# Patient Record
Sex: Male | Born: 1935 | Race: White | Hispanic: No | Marital: Married | Smoking: Former smoker
Health system: Southern US, Community
[De-identification: ages and names within clinical notes are randomized; demographics above are authoritative.]

## PROBLEM LIST (undated history)

## (undated) DIAGNOSIS — K635 Polyp of colon: Secondary | ICD-10-CM

## (undated) DIAGNOSIS — H9190 Unspecified hearing loss, unspecified ear: Secondary | ICD-10-CM

## (undated) DIAGNOSIS — Z931 Gastrostomy status: Secondary | ICD-10-CM

## (undated) DIAGNOSIS — R3915 Urgency of urination: Secondary | ICD-10-CM

## (undated) DIAGNOSIS — Z9221 Personal history of antineoplastic chemotherapy: Secondary | ICD-10-CM

## (undated) DIAGNOSIS — R51 Headache: Secondary | ICD-10-CM

## (undated) DIAGNOSIS — K469 Unspecified abdominal hernia without obstruction or gangrene: Secondary | ICD-10-CM

## (undated) DIAGNOSIS — C439 Malignant melanoma of skin, unspecified: Secondary | ICD-10-CM

## (undated) DIAGNOSIS — C4490 Unspecified malignant neoplasm of skin, unspecified: Secondary | ICD-10-CM

## (undated) DIAGNOSIS — R972 Elevated prostate specific antigen [PSA]: Secondary | ICD-10-CM

## (undated) DIAGNOSIS — Z87448 Personal history of other diseases of urinary system: Secondary | ICD-10-CM

## (undated) DIAGNOSIS — K219 Gastro-esophageal reflux disease without esophagitis: Secondary | ICD-10-CM

## (undated) DIAGNOSIS — C50929 Malignant neoplasm of unspecified site of unspecified male breast: Secondary | ICD-10-CM

## (undated) DIAGNOSIS — Z125 Encounter for screening for malignant neoplasm of prostate: Principal | ICD-10-CM

## (undated) DIAGNOSIS — C01 Malignant neoplasm of base of tongue: Secondary | ICD-10-CM

## (undated) DIAGNOSIS — K5790 Diverticulosis of intestine, part unspecified, without perforation or abscess without bleeding: Secondary | ICD-10-CM

## (undated) DIAGNOSIS — M1990 Unspecified osteoarthritis, unspecified site: Secondary | ICD-10-CM

## (undated) DIAGNOSIS — G500 Trigeminal neuralgia: Secondary | ICD-10-CM

## (undated) DIAGNOSIS — R110 Nausea: Principal | ICD-10-CM

## (undated) DIAGNOSIS — I10 Essential (primary) hypertension: Secondary | ICD-10-CM

## (undated) DIAGNOSIS — G4700 Insomnia, unspecified: Secondary | ICD-10-CM

## (undated) DIAGNOSIS — D649 Anemia, unspecified: Secondary | ICD-10-CM

## (undated) DIAGNOSIS — IMO0001 Reserved for inherently not codable concepts without codable children: Secondary | ICD-10-CM

## (undated) DIAGNOSIS — J984 Other disorders of lung: Secondary | ICD-10-CM

## (undated) DIAGNOSIS — R350 Frequency of micturition: Secondary | ICD-10-CM

## (undated) DIAGNOSIS — N189 Chronic kidney disease, unspecified: Secondary | ICD-10-CM

## (undated) DIAGNOSIS — Z923 Personal history of irradiation: Secondary | ICD-10-CM

## (undated) DIAGNOSIS — Z8719 Personal history of other diseases of the digestive system: Secondary | ICD-10-CM

## (undated) DIAGNOSIS — E785 Hyperlipidemia, unspecified: Secondary | ICD-10-CM

## (undated) DIAGNOSIS — F419 Anxiety disorder, unspecified: Secondary | ICD-10-CM

## (undated) DIAGNOSIS — N201 Calculus of ureter: Secondary | ICD-10-CM

## (undated) DIAGNOSIS — H269 Unspecified cataract: Secondary | ICD-10-CM

## (undated) DIAGNOSIS — Z5189 Encounter for other specified aftercare: Secondary | ICD-10-CM

## (undated) DIAGNOSIS — K5730 Diverticulosis of large intestine without perforation or abscess without bleeding: Secondary | ICD-10-CM

## (undated) HISTORY — DX: Encounter for screening for malignant neoplasm of prostate: Z12.5

## (undated) HISTORY — DX: Other disorders of lung: J98.4

## (undated) HISTORY — DX: Malignant neoplasm of base of tongue: C01

## (undated) HISTORY — DX: Gastrostomy status: Z93.1

## (undated) HISTORY — PX: MELANOMA EXCISION: SHX5266

## (undated) HISTORY — DX: Polyp of colon: K63.5

## (undated) HISTORY — DX: Personal history of antineoplastic chemotherapy: Z92.21

## (undated) HISTORY — PX: OTHER SURGICAL HISTORY: SHX169

## (undated) HISTORY — DX: Elevated prostate specific antigen (PSA): R97.20

## (undated) HISTORY — DX: Nausea: R11.0

## (undated) HISTORY — PX: GASTROSTOMY TUBE PLACEMENT: SHX655

## (undated) HISTORY — DX: Unspecified malignant neoplasm of skin, unspecified: C44.90

## (undated) HISTORY — DX: Personal history of irradiation: Z92.3

## (undated) HISTORY — DX: Malignant melanoma of skin, unspecified: C43.9

## (undated) HISTORY — DX: Diverticulosis of intestine, part unspecified, without perforation or abscess without bleeding: K57.90

---

## 1986-03-22 DIAGNOSIS — C50929 Malignant neoplasm of unspecified site of unspecified male breast: Secondary | ICD-10-CM

## 1986-03-22 HISTORY — PX: SIMPLE MASTECTOMY: SHX2409

## 1986-03-22 HISTORY — DX: Malignant neoplasm of unspecified site of unspecified male breast: C50.929

## 1998-01-04 ENCOUNTER — Emergency Department (HOSPITAL_COMMUNITY): Admission: EM | Admit: 1998-01-04 | Discharge: 1998-01-04 | Payer: Self-pay | Admitting: Emergency Medicine

## 1999-10-30 ENCOUNTER — Encounter: Payer: Self-pay | Admitting: Emergency Medicine

## 1999-10-30 ENCOUNTER — Emergency Department (HOSPITAL_COMMUNITY): Admission: EM | Admit: 1999-10-30 | Discharge: 1999-10-30 | Payer: Self-pay | Admitting: Emergency Medicine

## 2000-03-22 DIAGNOSIS — C4490 Unspecified malignant neoplasm of skin, unspecified: Secondary | ICD-10-CM

## 2000-03-22 HISTORY — DX: Unspecified malignant neoplasm of skin, unspecified: C44.90

## 2000-06-14 ENCOUNTER — Emergency Department (HOSPITAL_COMMUNITY): Admission: EM | Admit: 2000-06-14 | Discharge: 2000-06-14 | Payer: Self-pay | Admitting: Emergency Medicine

## 2001-01-11 ENCOUNTER — Emergency Department (HOSPITAL_COMMUNITY): Admission: EM | Admit: 2001-01-11 | Discharge: 2001-01-11 | Payer: Self-pay | Admitting: *Deleted

## 2001-01-11 ENCOUNTER — Encounter: Payer: Self-pay | Admitting: Emergency Medicine

## 2001-03-22 HISTORY — PX: CRANIECTOMY SUBOCCIPITAL FOR EXPLORATION / DECOMPRESSION CRANIAL NERVES: SUR326

## 2001-10-08 ENCOUNTER — Encounter: Payer: Self-pay | Admitting: Neurosurgery

## 2001-10-08 ENCOUNTER — Ambulatory Visit (HOSPITAL_COMMUNITY): Admission: RE | Admit: 2001-10-08 | Discharge: 2001-10-08 | Payer: Self-pay | Admitting: Neurosurgery

## 2001-10-11 ENCOUNTER — Encounter: Payer: Self-pay | Admitting: Neurosurgery

## 2001-10-13 ENCOUNTER — Inpatient Hospital Stay (HOSPITAL_COMMUNITY): Admission: RE | Admit: 2001-10-13 | Discharge: 2001-10-16 | Payer: Self-pay | Admitting: Neurosurgery

## 2004-02-25 ENCOUNTER — Ambulatory Visit: Payer: Self-pay | Admitting: Internal Medicine

## 2004-03-06 ENCOUNTER — Ambulatory Visit: Payer: Self-pay | Admitting: Internal Medicine

## 2004-06-18 ENCOUNTER — Emergency Department (HOSPITAL_COMMUNITY): Admission: EM | Admit: 2004-06-18 | Discharge: 2004-06-18 | Payer: Self-pay | Admitting: Emergency Medicine

## 2004-07-17 ENCOUNTER — Emergency Department (HOSPITAL_COMMUNITY): Admission: EM | Admit: 2004-07-17 | Discharge: 2004-07-17 | Payer: Self-pay | Admitting: Emergency Medicine

## 2004-11-27 ENCOUNTER — Inpatient Hospital Stay (HOSPITAL_COMMUNITY): Admission: EM | Admit: 2004-11-27 | Discharge: 2004-11-30 | Payer: Self-pay | Admitting: Emergency Medicine

## 2004-11-28 ENCOUNTER — Ambulatory Visit: Payer: Self-pay | Admitting: Internal Medicine

## 2004-11-28 ENCOUNTER — Ambulatory Visit: Payer: Self-pay | Admitting: Endocrinology

## 2004-11-28 ENCOUNTER — Encounter (INDEPENDENT_AMBULATORY_CARE_PROVIDER_SITE_OTHER): Payer: Self-pay | Admitting: *Deleted

## 2005-02-08 ENCOUNTER — Ambulatory Visit: Payer: Self-pay | Admitting: Internal Medicine

## 2006-10-13 DIAGNOSIS — I10 Essential (primary) hypertension: Secondary | ICD-10-CM | POA: Insufficient documentation

## 2006-11-08 ENCOUNTER — Ambulatory Visit: Payer: Self-pay | Admitting: Internal Medicine

## 2006-11-08 DIAGNOSIS — E785 Hyperlipidemia, unspecified: Secondary | ICD-10-CM

## 2006-11-08 DIAGNOSIS — K5730 Diverticulosis of large intestine without perforation or abscess without bleeding: Secondary | ICD-10-CM | POA: Insufficient documentation

## 2006-11-08 DIAGNOSIS — Z860100 Personal history of colon polyps, unspecified: Secondary | ICD-10-CM | POA: Insufficient documentation

## 2006-11-08 DIAGNOSIS — E7800 Pure hypercholesterolemia, unspecified: Secondary | ICD-10-CM

## 2006-11-08 DIAGNOSIS — R1011 Right upper quadrant pain: Secondary | ICD-10-CM

## 2006-11-08 LAB — CONVERTED CEMR LAB
ALT: 24 units/L (ref 0–53)
AST: 19 units/L (ref 0–37)
Albumin: 3.7 g/dL (ref 3.5–5.2)
BUN: 18 mg/dL (ref 6–23)
Basophils Absolute: 0.1 10*3/uL (ref 0.0–0.1)
Calcium: 9.4 mg/dL (ref 8.4–10.5)
Chloride: 107 meq/L (ref 96–112)
Eosinophils Absolute: 0.1 10*3/uL (ref 0.0–0.6)
GFR calc non Af Amer: 48 mL/min
MCHC: 34.5 g/dL (ref 30.0–36.0)
MCV: 88.9 fL (ref 78.0–100.0)
Platelets: 202 10*3/uL (ref 150–400)
RBC: 4.68 M/uL (ref 4.22–5.81)
WBC: 6.8 10*3/uL (ref 4.5–10.5)

## 2006-11-09 ENCOUNTER — Ambulatory Visit: Payer: Self-pay | Admitting: Cardiology

## 2007-08-29 ENCOUNTER — Telehealth: Payer: Self-pay | Admitting: Internal Medicine

## 2007-09-15 ENCOUNTER — Ambulatory Visit: Payer: Self-pay | Admitting: Internal Medicine

## 2007-09-15 ENCOUNTER — Observation Stay (HOSPITAL_COMMUNITY): Admission: EM | Admit: 2007-09-15 | Discharge: 2007-09-16 | Payer: Self-pay | Admitting: Unknown Physician Specialty

## 2007-09-18 ENCOUNTER — Telehealth: Payer: Self-pay | Admitting: Internal Medicine

## 2007-09-25 ENCOUNTER — Encounter: Payer: Self-pay | Admitting: Internal Medicine

## 2007-09-25 ENCOUNTER — Ambulatory Visit: Payer: Self-pay

## 2007-09-25 DIAGNOSIS — IMO0001 Reserved for inherently not codable concepts without codable children: Secondary | ICD-10-CM

## 2007-09-25 HISTORY — DX: Reserved for inherently not codable concepts without codable children: IMO0001

## 2007-10-03 ENCOUNTER — Telehealth: Payer: Self-pay | Admitting: Internal Medicine

## 2007-10-07 ENCOUNTER — Emergency Department (HOSPITAL_COMMUNITY): Admission: EM | Admit: 2007-10-07 | Discharge: 2007-10-07 | Payer: Self-pay | Admitting: Emergency Medicine

## 2007-10-12 ENCOUNTER — Ambulatory Visit: Payer: Self-pay | Admitting: Internal Medicine

## 2007-10-12 DIAGNOSIS — G500 Trigeminal neuralgia: Secondary | ICD-10-CM | POA: Insufficient documentation

## 2007-12-22 ENCOUNTER — Encounter: Payer: Self-pay | Admitting: Internal Medicine

## 2009-03-27 ENCOUNTER — Telehealth: Payer: Self-pay | Admitting: Family Medicine

## 2009-10-13 ENCOUNTER — Encounter: Payer: Self-pay | Admitting: Internal Medicine

## 2009-11-19 ENCOUNTER — Ambulatory Visit: Payer: Self-pay | Admitting: Internal Medicine

## 2009-11-19 DIAGNOSIS — F411 Generalized anxiety disorder: Secondary | ICD-10-CM | POA: Insufficient documentation

## 2010-04-21 NOTE — Progress Notes (Signed)
Summary: glucometer for daughter  Phone Note Call from Patient   Summary of Call: patient's daughter Jatniel Verastegui) is calling because she needs a voice recognition glucometer.  she is on medicaid but is willing to come in for an office visit.  is a written rx okay to give to patient?  also is there a voice recognition insuline pump?  pt is staying with linda suggs. also is there and endo that will except her as a patient.  pt's number is [phone] and linda's number is [phone] or [phone]. Initial call taken by: Kern Reap CMA Duncan Dull),  March 27, 2009 1:01 PM  Follow-up for Phone Call        crystal is not a patient here, she needs to contact the physician that she is working with Follow-up by: Roderick Pee MD,  March 27, 2009 1:45 PM  Additional Follow-up for Phone Call Additional follow up Details #1::        patient is aware Additional Follow-up by: Kern Reap CMA Duncan Dull),  March 27, 2009 3:02 PM

## 2010-04-21 NOTE — Assessment & Plan Note (Signed)
Summary: anxiety/dm   Vital Signs:  Patient profile:   77 year old male Weight:      194 pounds Temp:     97.9 degrees F oral BP sitting:   146 / 80  (left arm) Cuff size:   regular  Vitals Entered By: Duard Brady LPN (November 19, 2009 10:05 AM) CC: c/o increased anxiety - not sleeping  Is Patient Diabetic? No   CC:  c/o increased anxiety - not sleeping .  History of Present Illness: 77 year old patient since for follow-up.  he has not been seen in this office in some time and is followed at the [facility] system.  He states that he was seen 6 days ago for a complete physical.  He states for the past week or so he has had  episodes of anxiety.  He states that he has had a difficult time sleeping and just as the starts to not off awakens with a startle.  He is a former tobacco user, but none in 20 years.  He states that he had similar issues 14 years ago and was treated successfully with lorazepam.  He states that he did have laboratory studies done last week at the [facility].  He has treated hypertension and a history of dyslipidemia.  He also has a history colonic polyps.  He is on  gabapenim apparently for trigeminal neuralgia  Allergies: 1)  ! Codeine  Past History:  Past Medical History: Reviewed history from 10/12/2007 and no changes required. Colonic polyps, hx of Diverticulosis, colon Hyperlipidemia Hypertension trigeminal neuralgia history of melanoma and history of right breast cancer in 1988 lower GI bleed 2006.  History of duodenal AVMs glaucoma  Past Surgical History: Reviewed history from 10/12/2007 and no changes required. melanoma resection x 2 of the back right breast mastectomy 1988 surgery for trigeminal neuralgia 2002  Colonoscopy 2006  Family History: Reviewed history from 11/08/2006 and no changes required. both parents 14 of cardiac complications.  One brother died of an axonal death two sisters, one with COPD  Social History: Reviewed  history from 10/13/2006 and no changes required. Married Former Smoker Alcohol use-no Drug use-no Regular exercise-no  Review of Systems  The patient denies anorexia, fever, weight loss, weight gain, vision loss, decreased hearing, hoarseness, chest pain, syncope, dyspnea on exertion, peripheral edema, prolonged cough, headaches, hemoptysis, abdominal pain, melena, hematochezia, severe indigestion/heartburn, hematuria, incontinence, genital sores, muscle weakness, suspicious skin lesions, transient blindness, difficulty walking, depression, unusual weight change, abnormal bleeding, enlarged lymph nodes, angioedema, breast masses, and testicular masses.    Physical Exam  General:  Well-developed,well-nourished,in no acute distress; alert,appropriate and cooperative throughout examination; 140/84 Head:  Normocephalic and atraumatic without obvious abnormalities. No apparent alopecia or balding. Eyes:  No corneal or conjunctival inflammation noted. EOMI. Perrla. Funduscopic exam benign, without hemorrhages, exudates or papilledema. Vision grossly normal. Mouth:  Oral mucosa and oropharynx without lesions or exudates.  Teeth in good repair. Neck:  No deformities, masses, or tenderness noted. Chest Wall:  No deformities, masses, tenderness or gynecomastia noted. Breasts:  right mastectomy Lungs:  Normal respiratory effort, chest expands symmetrically. Lungs are clear to auscultation, no crackles or wheezes. O2 saturation 97% Heart:  Normal rate and regular rhythm. S1 and S2 normal without gallop, murmur, click, rub or other extra sounds. Abdomen:  Bowel sounds positive,abdomen soft and non-tender without masses, organomegaly or hernias noted. Msk:  No deformity or scoliosis noted of thoracic or lumbar spine.   Pulses:  R and L carotid,radial,femoral,dorsalis  pedis and posterior tibial pulses are full and equal bilaterally Extremities:  No clubbing, cyanosis, edema, or deformity noted with normal  full range of motion of all joints.   Skin:  Intact without suspicious lesions or rashes Cervical Nodes:  No lymphadenopathy noted Axillary Nodes:  No palpable lymphadenopathy   Impression & Recommendations:  Problem # 1:  HYPERCHOLESTEROLEMIA (ICD-272.0)  Problem # 2:  HYPERTENSION (ICD-401.9)  His updated medication list for this problem includes:    Lisinopril 40 Mg Tabs (Lisinopril) .Marland Kitchen... 1 once daily  His updated medication list for this problem includes:    Lisinopril 40 Mg Tabs (Lisinopril) .Marland Kitchen... 1 once daily  Problem # 3:  ANXIETY STATE, UNSPECIFIED (ICD-300.00)  His updated medication list for this problem includes:    Lorazepam 0.5 Mg Tabs (Lorazepam) ..... One twice daily as needed for anxiety or sleep  His updated medication list for this problem includes:    Lorazepam 0.5 Mg Tabs (Lorazepam) ..... One twice daily as needed for anxiety or sleep  Complete Medication List: 1)  Lisinopril 40 Mg Tabs (Lisinopril) .Marland Kitchen.. 1 once daily 2)  Gabapentin 300 Mg Caps (Gabapentin) .... 2 two times a day 3)  Lorazepam 0.5 Mg Tabs (Lorazepam) .... One twice daily as needed for anxiety or sleep  Patient Instructions: 1)  Please schedule a follow-up appointment in 3 months for CPX 2)  Limit your Sodium (Salt). 3)  It is important that you exercise regularly at least 20 minutes 5 times a week. If you develop chest pain, have severe difficulty breathing, or feel very tired , stop exercising immediately and seek medical attention. 4)  Please schedule a follow-up appointment in 1 month. Prescriptions: LORAZEPAM 0.5 MG TABS (LORAZEPAM) one twice daily as needed for anxiety or sleep  #50 x 2   Entered and Authorized by:   Gordy Savers  MD   Signed by:   Gordy Savers  MD on 11/19/2009   Method used:   Print then Give to Patient   RxID:   [id number]

## 2010-04-21 NOTE — Letter (Signed)
Summary: Colonoscopy-Changed to Office Visit Letter  [facility]  [address] [place], [place] [address]   Phone: [phone]  Fax: [phone]      October 13, 2009 MRN: [id number]   [facility] [place] [address] [place], [place]  [address]   Dear Mr. Latour,   According to our records, it is time for you to schedule a Colonoscopy. However, after reviewing your medical record, I feel that an office visit would be most appropriate to more completely evaluate you and determine your need for a repeat procedure.  Please call [phone] (option #2) at your convenience to schedule an office visit. If you have any questions, concerns, or feel that this letter is in error, we would appreciate your call.   Sincerely,  Wilhemina Bonito. Marina Goodell, M.D.  [facility] [phone]

## 2010-08-04 NOTE — Discharge Summary (Signed)
Jose [name], Jose [name] NO.:  [id number]   MEDICAL RECORD NO.:  [id number]          PATIENT TYPE:  INP   LOCATION:  4705                         FACILITY:  [facility]   PHYSICIAN:  Rosalyn Gess. Norins, MD  DATE OF BIRTH:  10/14/1927   DATE OF ADMISSION:  09/15/2007  DATE OF DISCHARGE:  09/16/2007                               DISCHARGE SUMMARY   CHIEF COMPLAINT:  Chest pain, rule out MI.   DISCHARGE DIAGNOSIS:  Chest pain resolved, myocardial infarction ruled  out by serial cardiac enzymes.   HISTORY OF PRESENT ILLNESS:  The patient has had chest pain on the  morning of admission and had fatigue for 3 months.  The patient is a 77-  year-old white male with history of hypertension, hyperlipidemia, remote  tobacco abuse, who reported midsternal chest pain that awoke him from  sleep on the morning of admission.  He described the chest pain as a  pressure-like feeling in the center of his chest like an elephant  sitting on his chest.  He had associated uncontrolled shaking that  lasted about 30 minutes, but no fevers or documented chills.  The  patient reports pain lasting several minutes, but it was intermittent  and worse on deep inspiration.  He had no associated nausea, vomiting,  diaphoresis, or shortness of breath.  The patient does report history of  increased fatigue over the last 3 months.  The patient was subsequent  admitted for evaluation to rule out for MI.   PAST MEDICAL HISTORY:  1. Hypertension.  2. Hyperlipidemia.  3. Diverticulosis with history of GI bleed in Aug 23, 2004.  4. Colon polyps.  5. Gastric and duodenal AVMs.  6. History of melanoma with excision in 08/24/99.  7. History of breast cancer status post lumpectomy and chemotherapy in      1986/08/24.  8. Trigeminal neuralgia status post decompression and microdissection      in 08/23/01.   CURRENT MEDICATIONS:  Lisinopril 40 mg daily.   ALLERGIES:  CODEINE.   FAMILY HISTORY:  Father and mother both deceased in  their 73s with CAD.  Mother had diabetes.   SOCIAL HISTORY:  The patient is married.  He has remote tobacco abuse.  He is a retired Teacher, music.  He had two grown children, one son  deceased in 08/24/2006 secondary to complications of alcohol abuse.   His review of systems revealed no fever, positive for fatigue, negative  for congestion, negative for neck stiffness or abnormalities, positive  for chest pain as noted, negative for lower extremity edema or  respiratory symptoms, no GI complaints except chronic constipation.   Physical exam at admission, blood pressure 112/60, heart rate 57,  respirations 18, and temperature was 97.8.  This is a pleasant  gentleman, awake and alert, in no acute distress.  HEENT exam was  unremarkable.  Chest was clear with no rales, wheezes, or rhonchi.  No  tenderness to palpation.  Cardiovascular with regular rate and rhythm  without murmurs, rubs, or gallops.  Abdomen was soft and nontender with  positive bowel sounds.  Skin was clear.  Neurologic exam was nonfocal.   HOSPITAL COURSE:  1. The patient was admitted to the hospital.  He had cardiac enzymes      point-of-care in the emergency department negative q. hour x2.  The      patient had a q.8 hour cardiac panels with CK of 68, 62, and 74 and      troponin I of 0.01, 0.02, and 0.02.  The patient's telemetry      remained unremarkable.  The patient's chest pain resolved.  With      the patient having normal cardiac enzymes, normal EKG with      resolution of his chest pain and discomfort, he is thought to be      stable and ready for discharge home with follow up with Dr. Eleonore Chiquito who will be seeing the patient and possibly arranging      for outpatient nuclear stress study.  2. Hyperglycemia.  The patient did have a hemoglobin A1c of 6.3%.      Plan will be per Dr. Amador Cunas.  3. Hyperlipidemia.  The patient did have a cholesterol of 217, LDL of      164, and HDL of 23.   PLAN:   Followup with Dr. Amador Cunas in regards to the indication for  medical therapy.   DISCHARGE EXAM:  Temperature was 97.7, blood pressure 122/69, pulse 58,  respirations 18, and O2 sats 100% on room air.  The patient was  encountered walking in the hall.  He returned to his room.  Examination  revealed his lungs to be clear with no rales, wheezes, or rhonchi.  Cardiovascular exam revealed 2+ radial pulse and regular rate and  rhythm.  No further examination conducted.   DISPOSITION:  The patient will be discharged home.  The patient's  doctor, Eleonore Chiquito, will be notified of this admission and need  for close followup.   DISCHARGE MEDICATIONS:  The patient will continue on lisinopril, vitamin  E and B, hydrochlorothiazide 12.5 mg 2 tablets daily, and gabapentin 300  mg p.r.n. for pain.   The patient's condition at the time of discharge dictation is stable and  improved.      Rosalyn Gess Norins, MD  Electronically Signed     MEN/MEDQ  D:  09/16/2007  T:  09/17/2007  Job:  [id number]   cc:   Gordy Savers, MD

## 2010-08-07 NOTE — Discharge Summary (Signed)
[name], Jose [name] NO.:  [id number]   MEDICAL RECORD NO.:  [id number]          PATIENT TYPE:  INP   LOCATION:  5707                         FACILITY:  [facility]   PHYSICIAN:  Thomos Lemons, D.O. [facility]   DATE OF BIRTH:  10/26/1927   DATE OF ADMISSION:  11/27/2004  DATE OF DISCHARGE:  11/30/2004                                 DISCHARGE SUMMARY   DISCHARGE DIAGNOSES:  1.  Gastrointestinal bleed, presumed secondary to colon diverticula.  2.  Anemia secondary to gastrointestinal blood loss.  3.  Small-bowel diverticula.  4.  Gastric and duodenal arteriovenous malformations.  5.  History of hyperlipidemia.  6.  Hypertension.  7.  History of melanoma.  8.  History of breast cancer.   DISCHARGE MEDICATION:  Protonix 40 mg once a day.  He is to follow up with  his primary care physician for further instructions regarding his blood  pressure medicine.   FOLLOW UP INSTRUCTIONS:  He was instructed to follow up with Dr. Amador Cunas  in one week after discharge and also follow up with Dr. Christella Hartigan,  gastroenterologist.   HOSPITAL COURSE:  Patient is a 77 year old male with history of hypertension  breast cancer and hyperlipidemia who presented with bright red blood per  rectum on November 27, 2004.  Patient reported a total of eight blood bowel  movements on admission and was feeling weak and short of breath.  Patient  was initially stabilized with transfusions.  GI was consulted for endoscopy  and also colonoscopy.   PROBLEM #1 -  GASTROINTESTINAL BLEED:  Patient was stabilized with IV  hydration and transfusion of packed red blood cells.  Patient underwent  endoscopy which showed angiodysplasia of stomach and duodenum, not bleeding  but was treated.  He also underwent colonoscopy which revealed  diverticulosis suspect source of GI bleed and a polyp in the sigmoid colon  that was 10 mm.  Biopsy sent to pathology.  A small-bowel follow-through was  ordered which revealed  multiple small-bowel diverticula, no mass was  identified.   PROBLEM #2 -  HYPERTENSION:  Patient's lisinopril was held secondary to  relative hypotension and patient's blood pressure was in the 140s without  any blood pressure medication on discharge and further management will be as  per his primary care physician.   PROBLEM #3 -  HISTORY OF MELANOMA:  No sign of metastatic disease to the  small-bowel.   PROBLEM #4 -  HISTORY OF BREAST CANCER:  Stable during this admission.   PROBLEM #5 -  ARTERIOVENOUS MALFORMATIONS GASTRIC AND DUODENAL:  They were  not bleeding but they were ablated.   LABORATORY DATA:  Prior to admission patient's hemoglobin was 10.1,  hematocrit 29.5.  On November 29, 2004, sodium was 143, potassium 3.9,  chloride 114, CO2 24, blood glucose 105, BUN 16, and creatinine 1.2, calcium  8.0.   FOLLOW UP:  He is to see Dr. Arlyce Dice as an outpatient as needed and we are  still awaiting final pathology on colon polyp.  He is to follow up with his  primary care physician, Dr.  Amador Cunas.   CONDITION ON DISCHARGE:  Improved and stable.      Thomos Lemons, D.O. [facility]  Electronically Signed     RY/MEDQ  D:  02/01/2005  T:  02/02/2005  Job:  [id number]

## 2010-08-07 NOTE — Discharge Summary (Signed)
NAMEDESMAN, POLAK                          ACCOUNT NO.:  [id number]   MEDICAL RECORD NO.:  [id number]                   PATIENT TYPE:  INP   LOCATION:  3101                                 FACILITY:  [facility]   PHYSICIAN:  Clydene Fake, M.D.               DATE OF BIRTH:  [birth year]   DATE OF ADMISSION:  10/13/2001  DATE OF DISCHARGE:  10/16/2001                                 DISCHARGE SUMMARY   ADMISSION DIAGNOSIS:  Right trigeminal neuralgia.   DISCHARGE DIAGNOSIS:  Right trigeminal neuralgia.   PROCEDURES:  Right suboccipital craniectomy and microvascular decompression  of the fifth nerve.   HISTORY OF PRESENT ILLNESS:  The patient is a 77 year old gentleman who has  had progressive increasing right V3 trigeminal neuralgia.  The symptoms  actually began in his early 33s.  It decreased for a long time and then over  the last three to five years has been increasing in severity and now is  unbearable.  He had an MR of the brain which showed no evidence of MS or any  other mass lesion.  The patient was brought in for a microvascular  decompression of the fifth nerve.   HOSPITAL COURSE:  The patient was admitted on the day of surgery and  underwent the procedure without complication.  The artery was pushing into  the  nerve and this was removed at surgery.  The patient was transferred to  the intensive care unit postoperatively.  As soon as he awoke, he noticed  that he did not have any facial pain, hearing was intact, and he had good  sensation of the face.  Over the next couple of days, he complained of  headache and a little back stiffness on the second postoperative day, but  was ambulating and doing well.  He remained neurologically intact and  remained asymptomatic with his trigeminal neuralgia.  On the day of October 16, 2001, he had a good day of ambulating the day before, had been eating, had  no headache, and incisions clean, dry, and intact.   DISPOSITION:  He will  be discharge home in stable condition.   DISCHARGE MEDICATIONS:  1. Gabapentin.  2. Simvastatin.  3. Lisinopril.  4. Multivitamins.  5. Vicodin ES one p.o. q.4h. p.r.n.  6. Ibuprofen over the count p.r.n.   ACTIVITY:  No strenuous activity.   WOUND CARE:  Keep the incision dry until sutures removed.    DIET:  As tolerated.   FOLLOW-UP:  Follow-up will be in about 10 days or so for suture removal.                                               Clydene Fake, M.D.    JRH/MEDQ  D:  10/16/2001  T:  10/22/2001  Job:  [id number]

## 2010-08-07 NOTE — Consult Note (Signed)
[facility]  Patient:    Jose [name], Jose [name]                       MRN: [id number] Proc. Date: 10/30/99 Adm. Date:  [id number] Disc. Date: [id number] Attending:  Benny Lennert CC:         [facility]  [facility]   Consultation Report  HISTORY OF PRESENT ILLNESS:  Jose [name] is a 77 year old right-handed white male born 12-22-1927, with a history of hypertension and hypercholesterolemia.  This patient is followed through the [facility] for his medications, which he did not bring with him and cannot recall the names of.  The patient has had a prior history of a right-sided trigeminal neuralgia two years ago, was treated through the [facility] at that time and given prednisone and Tegretol.  This alleviated his pain very rapidly, and he still had some medications left over.  The patient has done well for two years but a month ago began having recrudescence of the right trigeminal neuralgia once again.  The patient has hard jabs of pain going into the lower right face and jaw that are disabling to him.  The episodes have become frequent throughout the day, lasting one or two minutes.  This patient has begun to take his prednisone and carbamazepine once again and has been taking 200 mg twice a day of carbamazepine for the last couple of weeks prior to coming in.  The patient has had a gradual onset over the last week of some problems with being somewhat staggery.  The patient notes that when he get up out of a chair, he will tend to want to keep going forward, may stagger to once side or the next, does not consistently go to one side or the other.  The patient denies any numbness or weakness on the face, arms, or legs.  Denies any double vision, loss of vision, and denies any true vertigo.  The patient has had no problems with confusion or blacking out.  Neurology was asked to see this  patient for an evaluation.  PAST MEDICAL HISTORY: 1. History of trigeminal neuralgia. 2. Ataxia, likely secondary to medications. 3. Hypercholesterolemia. 4. History of hypertension. 5. History of breast cancer on the right, status post resection 13 years ago. 6. History of melanoma resection x 2.  MEDICATIONS:  Cholesterol medication, possibly Simvastatin.  The patient is on a blood pressure pressure pill that he cannot remember the name of.  He is taking half of an aspirin daily.  SOCIAL HISTORY:  Does not smoke or drink.   The patient is retired, lives in the [place] area with his wife.  Has two children, one of which is 48 years old and is an alcoholic.  One daughter is alive and well.  ALLERGIES:  CODEINE.  FAMILY HISTORY:  Notable in that mother died with an MI.  Father had episodes of shingles.  Cause of death is unknown.  The patient has two sisters who are alive.  Brother died in a fire.  Has a paternal grandmother who died with cancer.  REVIEW OF SYSTEMS:  Notable for no fevers, chills.  The patient denies any neck pain.  Does have some constipation problems.  Denies shortness of breath. Denies blackout episodes or seizures.  PHYSICAL EXAMINATION:  VITAL SIGNS:  Blood pressure 166/92, heart rate 62, respiratory rate 24, temperature afebrile.  GENERAL:  This  patient is a fairly well-developed white male who is alert and cooperative at the time of the examination.  HEENT:  Head is atraumatic.  Eyes:  Pupils are equal, round and reactive to light.  Discs are flat bilaterally.  NECK:  Supple.  No carotid bruits.  CHEST:  Clear to auscultation and percussion.  CARDIOVASCULAR:  Regular rate and rhythm without obvious murmurs or rubs.  EXTREMITIES:  Without significant edema.  NEUROLOGIC:  Cranial nerves as above.  Facial symmetry was present.  The patient has good sensation on the face to pinprick and soft touch bilaterally. The patient has good strength in  the facial muscles and the muscles of head turning and shoulder shrug bilaterally.  Speech is well-enunciated.  Some end-gaze nystagmus is noted bilaterally.  Tongue is midline.  Strength of the muscles of mastication was normal.  The patient has good strength in all four extremities, good, symmetric motor tone is noted throughout.  Sensory testing is intact to pinprick, soft touch, vibratory sensation throughout.  Deep tendon reflexes are symmetric and normal.  Toes are downgoing bilaterally. The patient has good finger-nose-finger, heel-to-shin bilaterally.  Gait is normal.  Tandem gait normal.  Romberg is negative.  No evidence of pronator drift is seen.  LABORATORY DATA:  CT scan of the brain is pending at this time.  Blood work to include a CBC, BMET, and sedimentation rate are pending.  Will add a carbamazepine level to this.  IMPRESSION: 1. Trigeminal neuralgia, right. 2. Slight ataxia, likely secondary to medication. 3. History of hypertension. 4. History of hypercholesterolemia.  This patient is followed through the [facility]. The patient appears to have gotten slightly toxic from carbamazepine after starting from 0 to 400 mg a day.  I do not see evidence of ataxia at this time.  I do not think that the patient has sustained a stroke event.  The patients trigeminal neuralgia, however, will need to be treated adequately if he is being disabled from the severe pain at this point.  PLAN: 1. Will begin a prednisone taper, a 10 mg, 12-day pack. 2. Continue Carbatrol 200 mg twice a day.  Will check carbamazepine levels    today.  The patient will follow up through our office or through the [facility] within the next three or four weeks. DD:  10/30/99 TD:  10/30/99 Job: [id number]

## 2010-08-07 NOTE — Op Note (Signed)
[place]  Patient:    Jose [name], Jose [name] Visit Number: [id number] MRN: [id number]          Service Type: SUR Location: [id number] Attending Physician:  Josie Saunders Dictated by:   Danae Orleans Venetia Maxon, M.D. Proc. Date: 10/13/01 Admit Date:  10/13/2001                             Operative Report  PREOPERATIVE DIAGNOSIS:  Right V3 trigeminal neuralgia.  POSTOPERATIVE DIAGNOSIS:  Right V3 trigeminal neuralgia.  PROCEDURE: 1. Right microvascular decompression of the fifth cranial nerve. 2. Microdissection.  SURGEON:  Danae Orleans. Venetia Maxon, M.D.  ASSISTANT:  Clydene Fake, M.D.  ANESTHESIA:  General endotracheal anesthesia.  ESTIMATED BLOOD LOSS:  400 cc.  COMPLICATIONS:  None.  DISPOSITION:  Recovery.  INDICATIONS:  The patient is a 77 year old man with approximately 50-year history of lancinating facial pain consistent with a V3 distribution trigeminal neuralgia on the right.  He has gotten much worse recently and is not able to eat, brush his teeth and has been in excruciating pain.  He had a normal MRI of his brain.  It was elected to take him to surgery for microvascular decompression of the trigeminal nerve.  PROCEDURE:  The patient was brought to the operating room.  Following the satisfactory and uncomplicated induction of general endotracheal anesthesia, placement of intravenous lines, Foley catheter and arterial line, he was placed in a park-bench position, left side down, and he was placed in a three-pin head fixation with the ______ oriented most superiorly.  A lazy-S curvilinear incision was made just behind his right ear and after infiltrating the subcutaneous tissues with 0.25% Marcaine and 0.5% lidocaine, 1:200,000 epinephrine.  After big incision, Raney clips were applied and then the soft tissues and muscle were stripped off of the skull.  A large perforating vein coming right out of the skull was transected with  this maneuver, and there was approximately 200 cc of blood loss before this was controlled.  This was subsequently controlled without difficulty.  Using the Anspach drill and acorn bit, a craniectomy was performed just posterior and inferior to the confluence of the sinuses.  The transverse sinus was exposed and as it drained into the sigmoid sinus on the right.  Approximately a half-dollar size craniectomy was created.  Subsequently, the microscope was then brought into the field and the dura was opened in the cruciate fashion with one limb of the dural opening based on the angle of the junction of the transverse and the sigmoid sinuses.  The Greenberg retractor was then assembled and using a 1/4-inch retractor blade, the CSF was drained and the cerebellar relaxation was obtained.  The retractor was then placed to expose the fifth cranial nerve.  The seventh and eighth cranial nerve were identified, and these were inferior to the exposure and not felt again under any tension.  Using a high magnification and microscopic visualization, the petrosal vein was identified as it entered the dura.  This was quite low and it was felt after manipulation of the fifth cranial nerve, it would be necessary to cauterize this in order to immobilize the fifth cranial nerve. The fifth cranial nerve was seen to be tented up overlying a large arterial loop, which appeared to compress the trigeminal nerve from the cephalad orientation.  There was a small perforating branch which supplied the brain stem which was draped over the  trigeminal nerve, and it was felt that this could not be taken without potential neurologic injury.  Therefore using a variety of arachnoid dissectors and Rhoton dissectors, the arterial loop was immobilized and the nerve was decompressed.  Subsequently, a Teflon pledget was fashioned and placed in the interval between the arterial loop and the trigeminal nerve.  This was felt to  adequately decompress the trigeminal nerve.  The wound was then copiously irrigated with continuous saline.  The self-retaining retractor was removed.  The microscope was taken out of the field.  There was excellent hemostasis.  The dura was closed with Nurolon sutures.  This is not a watertight closure.  A circle of Gelfoam was placed over the craniectomy defect.  The muscular layer was then reapproximated with 0 Vicryl sutures.  Subcutaneous tissues were reapproximated with 2-0 Vicryl interrupted, inverted sutures.  Skin edges were reapproximated with running 3-0 nylon lock stitch.  The patient was subsequently taken out of pin fixation and returned to the supine position on the stretcher.  He was extubated in the operating room, taken to the recovery room in stable satisfactory condition, and he tolerated this operation well.  Counts are correct at the end of the case. Dictated by:   Danae Orleans Venetia Maxon, M.D. Attending Physician:  Josie Saunders DD:  10/13/01 TD:  10/17/01 Job: [id number]

## 2010-08-07 NOTE — Consult Note (Signed)
NAMESHAI, MCKENZIE NO.:  [id number]   MEDICAL RECORD NO.:  [id number]          PATIENT TYPE:  EMS   LOCATION:  [facility]                         FACILITY:  [facility]   PHYSICIAN:  Iva Boop, M.D. LHCDATE OF BIRTH:  December 03, 1927   DATE OF CONSULTATION:  11/27/2004  DATE OF DISCHARGE:                                   CONSULTATION   REQUESTING PHYSICIAN:  Rene Paci, M.D.   PRIMARY CARE PHYSICIAN:  Gordy Savers, M.D.   REASON FOR CONSULTATION:  GI bleeding.   ASSESSMENT:  Gastrointestinal bleeding with mixture of melena and  hematochezia, elevated BUN and creatinine.  Probably an upper  gastrointestinal bleed, there are no obvious risk factors.  Chelation  therapy could be a risk factor for upper gastrointestinal bleeding and  ulceration depending upon what it is.   PLAN:  Upper GI endoscopy later today.  If that does not reveal anything, a  colonoscopy will be indicated.  Risks, benefits, indications reviewed.   HISTORY:  This is a 77 year old white man that started having dark, tarry  stools on Wednesday, two days ago, and mixed with red blood, then some  bloody stools as well.  They seem to be letting up somewhat.  Initially his  blood pressure was 81/50 in the emergency department with pulse 75, now  138/58, pulse 75 with IV fluids.  He is getting 2 units of packed red blood  cells for a hemoglobin of 7.9.  He has not had problems like this before.  He is not on NSAIDs that I am aware of.   DRUG ALLERGIES:  CODEINE.   MEDICATIONS:  Cholesterol medications, mineral supplement, oral chelation  supplement, Candy Caps herbal supplement and lisinopril.   PAST MEDICAL HISTORY:  1.  Dyslipidemia.  2.  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  3.  Breast cancer with lumpectomy and chemotherapy in 1988.  4.  Melanoma in the back last removed five years ago.   SOCIAL HISTORY:  He is retired.  He lives with his wife.  Rare beer.  Tobacco remotely but not now.   FAMILY HISTORY:  Coronary artery disease, alcoholism in his son.   REVIEW OF SYSTEMS:  All the review of systems seems negative at this time.   PHYSICAL EXAMINATION:  GENERAL:  An elderly white man, slightly pale, in no  acute distress.  VITAL SIGNS:  Pulse 75, respirations 20, blood pressure 130/58, temperature  97.6.  HEENT:  Eyes anicteric.  Mouth free of lesions.  NECK:  Supple, no mass.  CHEST:  Clear.  CARDIAC:  S1, S2, no gallops.  ABDOMEN:  Soft, nontender, without organomegaly or mass.  Bowel sounds are  present.  RECTAL:  Some red blood, no stool.  Small external hemorrhoids.  EXTREMITIES:  No cyanosis, clubbing or edema.  NEUROLOGIC:  He is alert and oriented x3.  LYMPHATIC:  No neck or supraclavicular nodes.   LABORATORY DATA:  As above.  Coagulation studies are normal.  Troponin  negative.  BNP 42.  Hemoglobin 7.9, hematocrit 23, platelets 228, MCV 87,  white count 9.1.  BUN 45, creatinine 1.4, glucose 148.  The remainder of the  BMET is normal.      Iva Boop, M.D. [facility]  Electronically Signed     CEG/MEDQ  D:  11/27/2004  T:  11/27/2004  Job:  [id number]

## 2010-12-17 LAB — POCT CARDIAC MARKERS
Myoglobin, poc: 125
Operator id: 295021
Operator id: 295021
Troponin i, poc: <0.05

## 2010-12-17 LAB — HEMOGLOBIN A1C: Hgb A1c MFr Bld: 6.3 — ABNORMAL HIGH

## 2010-12-17 LAB — CBC
HCT: 41.2
Hemoglobin: 14.3
MCHC: 34.8
MCV: 88
Platelets: 246
RBC: 4.68
RDW: 13.2
WBC: 9.2

## 2010-12-17 LAB — POCT I-STAT, CHEM 8
BUN: 21
Chloride: 106
Potassium: 4.3
Sodium: 137

## 2010-12-17 LAB — D-DIMER, QUANTITATIVE: D-Dimer, Quant: 0.42

## 2010-12-17 LAB — CARDIAC PANEL(CRET KIN+CKTOT+MB+TROPI)
CK, MB: 1
Troponin I: 0.02

## 2010-12-17 LAB — LIPID PANEL
HDL: 23 — ABNORMAL LOW
LDL Cholesterol: 164 — ABNORMAL HIGH
Triglycerides: 150 — ABNORMAL HIGH
VLDL: 30

## 2010-12-17 LAB — TROPONIN I: Troponin I: 0.01

## 2010-12-17 LAB — TSH: TSH: 1.142

## 2010-12-17 LAB — CK TOTAL AND CKMB (NOT AT ARMC): CK, MB: 1

## 2011-01-23 ENCOUNTER — Emergency Department (HOSPITAL_COMMUNITY)
Admission: EM | Admit: 2011-01-23 | Discharge: 2011-01-23 | Disposition: A | Discharge status: Other Acute Inpatient Hospital | Payer: Medicare Other | Attending: Emergency Medicine | Admitting: Emergency Medicine

## 2011-01-23 ENCOUNTER — Emergency Department (HOSPITAL_COMMUNITY): Payer: Medicare Other

## 2011-01-23 ENCOUNTER — Emergency Department (HOSPITAL_COMMUNITY): Payer: Non-veteran care

## 2011-01-23 ENCOUNTER — Inpatient Hospital Stay (HOSPITAL_COMMUNITY)
Admission: AD | Admit: 2011-01-23 | Discharge: 2011-01-27 | DRG: 694 | Disposition: A | Discharge status: Home / Self Care | Payer: Medicare Other | Source: Other Acute Inpatient Hospital | Attending: Internal Medicine | Admitting: Internal Medicine

## 2011-01-23 DIAGNOSIS — E860 Dehydration: Secondary | ICD-10-CM | POA: Diagnosis present

## 2011-01-23 DIAGNOSIS — E7800 Pure hypercholesterolemia, unspecified: Secondary | ICD-10-CM | POA: Diagnosis present

## 2011-01-23 DIAGNOSIS — R141 Gas pain: Secondary | ICD-10-CM | POA: Insufficient documentation

## 2011-01-23 DIAGNOSIS — I10 Essential (primary) hypertension: Secondary | ICD-10-CM

## 2011-01-23 DIAGNOSIS — E875 Hyperkalemia: Secondary | ICD-10-CM | POA: Diagnosis present

## 2011-01-23 DIAGNOSIS — N200 Calculus of kidney: Secondary | ICD-10-CM | POA: Diagnosis present

## 2011-01-23 DIAGNOSIS — N179 Acute kidney failure, unspecified: Secondary | ICD-10-CM | POA: Diagnosis present

## 2011-01-23 DIAGNOSIS — N1330 Unspecified hydronephrosis: Secondary | ICD-10-CM | POA: Diagnosis present

## 2011-01-23 DIAGNOSIS — K5900 Constipation, unspecified: Secondary | ICD-10-CM | POA: Diagnosis present

## 2011-01-23 DIAGNOSIS — G500 Trigeminal neuralgia: Secondary | ICD-10-CM | POA: Insufficient documentation

## 2011-01-23 DIAGNOSIS — Z79899 Other long term (current) drug therapy: Secondary | ICD-10-CM | POA: Insufficient documentation

## 2011-01-23 DIAGNOSIS — F411 Generalized anxiety disorder: Secondary | ICD-10-CM | POA: Diagnosis present

## 2011-01-23 DIAGNOSIS — R142 Eructation: Secondary | ICD-10-CM | POA: Insufficient documentation

## 2011-01-23 DIAGNOSIS — R109 Unspecified abdominal pain: Secondary | ICD-10-CM | POA: Insufficient documentation

## 2011-01-23 DIAGNOSIS — N201 Calculus of ureter: Secondary | ICD-10-CM | POA: Insufficient documentation

## 2011-01-23 DIAGNOSIS — E785 Hyperlipidemia, unspecified: Secondary | ICD-10-CM | POA: Diagnosis present

## 2011-01-23 DIAGNOSIS — R110 Nausea: Secondary | ICD-10-CM | POA: Insufficient documentation

## 2011-01-23 DIAGNOSIS — N400 Enlarged prostate without lower urinary tract symptoms: Secondary | ICD-10-CM | POA: Diagnosis present

## 2011-01-23 DIAGNOSIS — Z87448 Personal history of other diseases of urinary system: Secondary | ICD-10-CM

## 2011-01-23 DIAGNOSIS — I129 Hypertensive chronic kidney disease with stage 1 through stage 4 chronic kidney disease, or unspecified chronic kidney disease: Secondary | ICD-10-CM | POA: Diagnosis present

## 2011-01-23 DIAGNOSIS — N189 Chronic kidney disease, unspecified: Secondary | ICD-10-CM | POA: Diagnosis present

## 2011-01-23 DIAGNOSIS — N289 Disorder of kidney and ureter, unspecified: Secondary | ICD-10-CM | POA: Insufficient documentation

## 2011-01-23 HISTORY — DX: Personal history of other diseases of urinary system: Z87.448

## 2011-01-23 LAB — URINALYSIS, ROUTINE W REFLEX MICROSCOPIC
Bilirubin Urine: NEGATIVE
Ketones, ur: NEGATIVE mg/dL
Nitrite: NEGATIVE
Specific Gravity, Urine: 1.02 (ref 1.005–1.030)
Urobilinogen, UA: 0.2 mg/dL (ref 0.0–1.0)

## 2011-01-23 LAB — COMPREHENSIVE METABOLIC PANEL
BUN: 21 mg/dL (ref 6–23)
CO2: 20 mEq/L (ref 19–32)
Calcium: 9.9 mg/dL (ref 8.4–10.5)
Chloride: 105 mEq/L (ref 96–112)
Creatinine, Ser: 1.94 mg/dL — ABNORMAL HIGH (ref 0.50–1.35)
GFR calc Af Amer: 36 mL/min — ABNORMAL LOW (ref >90)
GFR calc non Af Amer: 31 mL/min — ABNORMAL LOW (ref >90)
Glucose, Bld: 162 mg/dL — ABNORMAL HIGH (ref 70–99)
Total Bilirubin: 0.6 mg/dL (ref 0.3–1.2)

## 2011-01-23 LAB — CBC
HCT: 42.2 % (ref 39.0–52.0)
Hemoglobin: 15.2 g/dL (ref 13.0–17.0)
MCH: 30.5 pg (ref 26.0–34.0)
MCHC: 36 g/dL (ref 30.0–36.0)
MCV: 84.6 fL (ref 78.0–100.0)
RBC: 4.99 MIL/uL (ref 4.22–5.81)

## 2011-01-23 LAB — POCT I-STAT, CHEM 8
BUN: 22 mg/dL (ref 6–23)
Chloride: 111 mEq/L (ref 96–112)
Creatinine, Ser: 2.2 mg/dL — ABNORMAL HIGH (ref 0.50–1.35)
Sodium: 140 mEq/L (ref 135–145)
TCO2: 19 mmol/L (ref 0–100)

## 2011-01-23 LAB — DIFFERENTIAL
Basophils Relative: 1 % (ref 0–1)
Lymphs Abs: 1.3 10*3/uL (ref 0.7–4.0)
Monocytes Absolute: 0.7 10*3/uL (ref 0.1–1.0)
Monocytes Relative: 7 % (ref 3–12)
Neutro Abs: 7.2 10*3/uL (ref 1.7–7.7)

## 2011-01-23 LAB — LACTIC ACID, PLASMA: Lactic Acid, Venous: 2.8 mmol/L — ABNORMAL HIGH (ref 0.5–2.2)

## 2011-01-23 LAB — PROCALCITONIN: Procalcitonin: <0.1 ng/mL

## 2011-01-23 LAB — HEMOGLOBIN A1C
Hgb A1c MFr Bld: 6 % — ABNORMAL HIGH (ref <5.7)
Mean Plasma Glucose: 126 mg/dL — ABNORMAL HIGH (ref <117)

## 2011-01-23 LAB — URINE MICROSCOPIC-ADD ON

## 2011-01-23 MED ORDER — POLYETHYLENE GLYCOL 3350 17 G PO PACK
17.0000 g | PACK | Freq: Every day | ORAL | Status: DC
  Administered 2011-01-25: 17 g via ORAL
  Filled 2011-01-23 (×3): qty 1

## 2011-01-23 MED ORDER — MORPHINE SULFATE 2 MG/ML IJ SOLN: 1.0000 mg | INTRAMUSCULAR | Status: DC | PRN

## 2011-01-23 MED ORDER — HYDRALAZINE HCL 20 MG/ML IJ SOLN
5.0000 mg | Freq: Four times a day (QID) | INTRAMUSCULAR | Status: DC | PRN
  Administered 2011-01-24 – 2011-01-25 (×2): 5 mg via INTRAVENOUS
  Filled 2011-01-23: qty 1

## 2011-01-23 MED ORDER — ACETAMINOPHEN 325 MG PO TABS
650.0000 mg | ORAL_TABLET | ORAL | Status: DC | PRN
  Administered 2011-01-24: 650 mg via ORAL

## 2011-01-23 MED ORDER — ONDANSETRON HCL 4 MG/2ML IJ SOLN
4.0000 mg | Freq: Four times a day (QID) | INTRAMUSCULAR | Status: DC | PRN
  Administered 2011-01-24 – 2011-01-25 (×3): 4 mg via INTRAVENOUS

## 2011-01-23 MED ORDER — DIPHENHYDRAMINE HCL 50 MG/ML IJ SOLN: 12.5000 mg | Freq: Four times a day (QID) | INTRAMUSCULAR | Status: DC | PRN

## 2011-01-23 MED ORDER — OXYCODONE HCL 5 MG PO TABS: 5.0000 mg | ORAL_TABLET | ORAL | Status: DC | PRN

## 2011-01-23 MED ORDER — ONDANSETRON HCL 4 MG PO TABS: 4.0000 mg | ORAL_TABLET | Freq: Four times a day (QID) | ORAL | Status: DC | PRN

## 2011-01-23 MED ORDER — SODIUM CHLORIDE 0.9 % IV SOLN
INTRAVENOUS | Status: AC
  Administered 2011-01-23 – 2011-01-26 (×5): via INTRAVENOUS

## 2011-01-23 MED ORDER — CHLORHEXIDINE GLUCONATE 0.12 % MT SOLN
15.0000 mL | Freq: Two times a day (BID) | OROMUCOSAL | Status: DC
  Administered 2011-01-24 – 2011-01-27 (×7): 15 mL via OROMUCOSAL
  Filled 2011-01-23 (×8): qty 15

## 2011-01-23 MED ORDER — BIOTENE DRY MOUTH MT LIQD
15.0000 mL | OROMUCOSAL | Status: DC
  Administered 2011-01-24 – 2011-01-26 (×4): 15 mL via OROMUCOSAL

## 2011-01-23 MED ORDER — DIPHENHYDRAMINE HCL 12.5 MG/5ML PO ELIX: 12.5000 mg | ORAL_SOLUTION | Freq: Four times a day (QID) | ORAL | Status: DC | PRN

## 2011-01-23 NOTE — H&P (Signed)
Jose [name], FROH NO.:  [id number]  MEDICAL RECORD NO.:  [id number]  LOCATION:  [facility]                         FACILITY:  [facility]  PHYSICIAN:  Clydia Llano, MD       DATE OF BIRTH:  07/14/1927  DATE OF ADMISSION:  01/23/2011 DATE OF DISCHARGE:                             HISTORY & PHYSICAL   PRIMARY CARE PHYSICIAN:  Gordy Savers, MD and                          [facility].  REASON FOR ADMISSION:  Abdominal pain.  HISTORY OF PRESENT ILLNESS:  Jose [name] is 77 year old male with past medical history of hypertension and diverticulosis.  The patient came in to the hospital complaining about abdominal pain.  The patient said he was in his usual state of health till last night.  About 12:30 in the morning, he had a severe abdominal pain, woke him up from sleep. Initially, he thought it might be urge to go to the bathroom but he did go and the pain did not get better, so he came in to the hospital for further evaluation.  The pain is 10/10, sharp and intermittent pain.  It was 10/10 initially and then went down to 6/10.  No radiation.  Did not notice anything increases or decreases the pain.  Upon initial evaluation in the emergency department, the patient did have a CT scan, which showed right-sided hydronephrosis with 1 cm right-sided ureteropelvic junction stone.  The patient will be admitted to the hospital for further evaluation.  PAST MEDICAL HISTORY: 1. Hypertension. 2. Diverticulosis. 3. Hypercholesterolemia. 4. Tic douloureux. 5. History of lower GI bleed secondary to diverticular bleed. 6. History of melanoma and decompression on microdissection of     trigeminal neuralgia. 7. Dyslipidemia. 8. Breast cancer with lumpectomy and chemotherapy in 1988.  ALLERGIES:  CODEINE.  FAMILY HISTORY:  Mother and father died in their 29s secondary to heart problems.  SOCIAL HISTORY:  The patient used to smoke when he was young for  about 20 years, was not heavy smoker.  No history of recreational drug use or alcohol use.  MEDICATIONS: 1. Lisinopril 40 mg p.o. daily. 2. Gabapentin 600 mg p.o. b.i.d. 3. Lorazepam 0.5 mg p.r.n. for anxiety.  REVIEW OF SYSTEMS:  GENERAL:  Denies fever, chills.  Denies blurry vision.  ENT:  Denies abnormalities.  CARDIOVASCULAR:  Denies palpitations or chest pain.  RESPIRATORY:  Denies shortness of breath or cough.  GI:  Has nausea and abdominal pain.  No vomiting.  No diarrhea. GU:  No burning while passing urine.  No hematuria.  MUSCULOSKELETAL: No joint deformity.  SKIN:  Denies any rash.  NEURO:  Denies any headache or focal weaknesses.  PSYCH:  Denies any mood abnormalities.  PHYSICAL EXAMINATION:  VITAL SIGNS:  Temperature, afebrile; respirations 24, pulse is 58, blood pressure is 143/157, initially it was read as 237/102.  The patient's O2 sat is 97%. GENERAL:  The patient is well-developed, well-nourished, in no acute distress, seems irritable. HEAD AND FACE:  Normocephalic, atraumatic. EYES:  Pupils equal, reactive to light and accommodation. ENT:  Ear,  nose and throat normal. NECK:  Supple.  No mass.  No lymphadenopathy. SPINE:  Entire spine nontender. CARDIOVASCULAR:  Regular rate and rhythm.  No murmurs, rubs, or gallops. RESPIRATORY:  Clear to auscultation bilaterally. ABDOMEN:  Soft.  Bowel sounds heard.  No masses.  No rigidity or rebound.  There is diffuse mild abdominal tenderness.  EXTREMITIES: Normal. NEUROLOGIC:  Alert, awake, oriented x3.  Cranial nerves II through XII grossly intact.  Motor intact in all in all extremities. SKIN:  Color normal.  No rash.  RADIOLOGY:  CT scan showed 1 cm stone at the right ureteropelvic junction, leads to mild-to-moderate hydronephrosis.  LABORATORY DATA: 1. UA showed negative nitrite and leukocyte esterase.  No pyuria.     There is microscopic hematuria with RBCs of 11-20.  MISCELLANEOUS: 1. Procalcitonin is less  than 0.1.  Lipase is 56. 2. BMP, sodium 139, potassium 4.2, chloride 105, bicarb is 20, glucose     162, BUN is 21, creatinine is 1.9. 3. LFTs, AST 21, ALT 25, protein is 7.3, and total bilirubin 0.6.     Alkaline phosphatase is 62. 4. CBC, WBCs is 9.3, hemoglobin 15.2, hematocrit 42.2, and platelets     231.  ASSESSMENT AND PLAN: 1. Abdominal pain.  This is likely secondary to the right side renal     stone.  The patient will be managed with IV narcotics and Urology     to see and evaluate for that.  He does not appear to have a     hydronephrosis or associated urinary tract infection.  We will     defer antibiotics for now. 2. Right 1 cm ureteropelvic junction stone with right mild-to-moderate     hydronephrosis.  The patient will be admitted for further     evaluation by Urology.  Dr. Mena Goes was contacted by the ED     physician and he requested for the patient to be admitted to [facility], so he can see him over there.  The patient to be     evaluated by Urology. 3. Acute renal failure.  In 2009, the patient's creatinine was 1.4.     The patient might have chronic kidney disease.  We will hydrate     with IV fluids for now and stop his ACE inhibitors.  Check renal     function in the morning. 4. Hypertension.  Blood pressure when the patient came in was in the     high side, likely secondary to his pain.     After pain was controlled, blood pressure was going down.  The     patient has diltiazem 25 mg IV x1.  He is not on any medication.  I     will put him on hydralazine as needed for high blood pressure and     he will be off of his ACE inhibitors because of increasing     creatinine.     Clydia Llano, MD     ME/MEDQ  D:  01/23/2011  T:  01/23/2011  Job:  [id number]  cc:   Gordy Savers, MD [address] [place] [address]  Electronically Signed by Clydia Llano  on 01/23/2011 08:16:48 PM

## 2011-01-24 ENCOUNTER — Inpatient Hospital Stay (HOSPITAL_COMMUNITY): Payer: Medicare Other | Admitting: Anesthesiology

## 2011-01-24 ENCOUNTER — Encounter (HOSPITAL_COMMUNITY): Payer: Self-pay | Admitting: Anesthesiology

## 2011-01-24 ENCOUNTER — Encounter (HOSPITAL_COMMUNITY)
Admission: AD | Disposition: A | Discharge status: Home / Self Care | Payer: Self-pay | Source: Other Acute Inpatient Hospital | Attending: Internal Medicine

## 2011-01-24 DIAGNOSIS — N179 Acute kidney failure, unspecified: Secondary | ICD-10-CM | POA: Diagnosis present

## 2011-01-24 DIAGNOSIS — N200 Calculus of kidney: Secondary | ICD-10-CM | POA: Diagnosis present

## 2011-01-24 DIAGNOSIS — N400 Enlarged prostate without lower urinary tract symptoms: Secondary | ICD-10-CM | POA: Diagnosis present

## 2011-01-24 HISTORY — PX: CYSTOSCOPY W/ URETERAL STENT PLACEMENT: SHX1429

## 2011-01-24 LAB — URINE CULTURE
Colony Count: NO GROWTH
Culture  Setup Time: 201211031746
Culture: NO GROWTH

## 2011-01-24 LAB — CBC
HCT: 41.8 % (ref 39.0–52.0)
Hemoglobin: 14.1 g/dL (ref 13.0–17.0)
MCH: 30 pg (ref 26.0–34.0)
MCHC: 33.7 g/dL (ref 30.0–36.0)
MCV: 88.9 fL (ref 78.0–100.0)
Platelets: 220 10*3/uL (ref 150–400)
RBC: 4.7 MIL/uL (ref 4.22–5.81)
RDW: 13.9 % (ref 11.5–15.5)
WBC: 8.9 10*3/uL (ref 4.0–10.5)

## 2011-01-24 LAB — BASIC METABOLIC PANEL
BUN: 25 mg/dL — ABNORMAL HIGH (ref 6–23)
CO2: 26 mEq/L (ref 19–32)
Calcium: 8.7 mg/dL (ref 8.4–10.5)
Chloride: 106 mEq/L (ref 96–112)
Creatinine, Ser: 2.73 mg/dL — ABNORMAL HIGH (ref 0.50–1.35)
GFR calc Af Amer: 24 mL/min — ABNORMAL LOW (ref >90)
GFR calc non Af Amer: 20 mL/min — ABNORMAL LOW (ref >90)
Glucose, Bld: 113 mg/dL — ABNORMAL HIGH (ref 70–99)
Potassium: 5.3 mEq/L — ABNORMAL HIGH (ref 3.5–5.1)
Sodium: 139 mEq/L (ref 135–145)

## 2011-01-24 LAB — HEMOGLOBIN A1C
Hgb A1c MFr Bld: 6.1 % — ABNORMAL HIGH (ref <5.7)
Mean Plasma Glucose: 128 mg/dL — ABNORMAL HIGH (ref <117)

## 2011-01-24 SURGERY — CYSTOSCOPY, WITH RETROGRADE PYELOGRAM AND URETERAL STENT INSERTION
Anesthesia: General | Site: Pelvis | Laterality: Right

## 2011-01-24 MED ORDER — SODIUM CHLORIDE 0.9 % IR SOLN
Status: DC | PRN
  Administered 2011-01-24: 1000 mL

## 2011-01-24 MED ORDER — STERILE WATER FOR IRRIGATION IR SOLN
Status: DC | PRN
  Administered 2011-01-24: 3000 mL

## 2011-01-24 MED ORDER — PROPOFOL 10 MG/ML IV EMUL
INTRAVENOUS | Status: DC | PRN
  Administered 2011-01-24: 30 mg via INTRAVENOUS
  Administered 2011-01-24: 100 mg via INTRAVENOUS

## 2011-01-24 MED ORDER — PROMETHAZINE HCL 25 MG/ML IJ SOLN: 12.5000 mg | Freq: Four times a day (QID) | INTRAMUSCULAR | Status: DC | PRN

## 2011-01-24 MED ORDER — THERA M PLUS PO TABS
1.0000 | ORAL_TABLET | Freq: Every day | ORAL | Status: DC
  Administered 2011-01-25 – 2011-01-27 (×3): 1 via ORAL
  Filled 2011-01-24 (×5): qty 1

## 2011-01-24 MED ORDER — LORAZEPAM 0.5 MG PO TABS
ORAL_TABLET | ORAL | Status: AC
  Administered 2011-01-24: 0.5 mg via ORAL
  Filled 2011-01-24: qty 1

## 2011-01-24 MED ORDER — MULTI-VITAMIN/MINERALS PO TABS
1.0000 | ORAL_TABLET | Freq: Every day | ORAL | Status: DC
Start: 2011-01-24 — End: 2011-01-24

## 2011-01-24 MED ORDER — LORAZEPAM 0.5 MG PO TABS
0.5000 mg | ORAL_TABLET | Freq: Two times a day (BID) | ORAL | Status: DC
  Administered 2011-01-24 – 2011-01-27 (×5): 0.5 mg via ORAL
  Filled 2011-01-24 (×4): qty 1

## 2011-01-24 MED ORDER — LIDOCAINE HCL 1 % IJ SOLN
INTRAMUSCULAR | Status: DC | PRN
  Administered 2011-01-24: 50 mg via INTRADERMAL

## 2011-01-24 MED ORDER — FENTANYL CITRATE 0.05 MG/ML IJ SOLN: 1.0000 ug/kg | INTRAMUSCULAR | Status: DC | PRN

## 2011-01-24 MED ORDER — CEFAZOLIN SODIUM 1-5 GM-% IV SOLN
1.0000 g | Freq: Once | INTRAVENOUS | Status: AC
  Administered 2011-01-24: 1 g via INTRAVENOUS
  Filled 2011-01-24: qty 50

## 2011-01-24 MED ORDER — FENTANYL CITRATE 0.05 MG/ML IJ SOLN
INTRAMUSCULAR | Status: DC | PRN
  Administered 2011-01-24: 50 ug via INTRAVENOUS

## 2011-01-24 MED ORDER — FENTANYL CITRATE 0.05 MG/ML IJ SOLN: 25.0000 ug | INTRAMUSCULAR | Status: DC | PRN

## 2011-01-24 MED ORDER — PROMETHAZINE HCL 25 MG/ML IJ SOLN: 6.2500 mg | INTRAMUSCULAR | Status: DC | PRN

## 2011-01-24 MED ORDER — IOHEXOL 300 MG/ML  SOLN
INTRAMUSCULAR | Status: DC | PRN
  Administered 2011-01-24: 50 mL

## 2011-01-24 MED ORDER — ONDANSETRON HCL 4 MG/2ML IJ SOLN
INTRAMUSCULAR | Status: AC
  Administered 2011-01-24: 4 mg via INTRAVENOUS
  Filled 2011-01-24: qty 2

## 2011-01-24 MED ORDER — SODIUM CHLORIDE 0.9 % IV SOLN
INTRAVENOUS | Status: DC | PRN
  Administered 2011-01-24: 10:00:00 via INTRAVENOUS

## 2011-01-24 MED ORDER — ACETAMINOPHEN 325 MG PO TABS
ORAL_TABLET | ORAL | Status: AC
  Filled 2011-01-24: qty 1

## 2011-01-24 MED ORDER — ONDANSETRON HCL 4 MG/2ML IJ SOLN
INTRAMUSCULAR | Status: DC | PRN
  Administered 2011-01-24: 4 mg via INTRAVENOUS

## 2011-01-24 MED ORDER — VITAMIN C 500 MG PO TABS
500.0000 mg | ORAL_TABLET | Freq: Every day | ORAL | Status: DC
  Administered 2011-01-24 – 2011-01-27 (×4): 500 mg via ORAL
  Filled 2011-01-24 (×4): qty 1

## 2011-01-24 MED ORDER — CIPROFLOXACIN HCL 250 MG PO TABS
250.0000 mg | ORAL_TABLET | Freq: Two times a day (BID) | ORAL | Status: DC
  Administered 2011-01-24 – 2011-01-25 (×2): 250 mg via ORAL
  Filled 2011-01-24 (×4): qty 1

## 2011-01-24 MED ORDER — ACETAMINOPHEN 325 MG PO TABS
ORAL_TABLET | ORAL | Status: AC
  Administered 2011-01-24: 650 mg via ORAL
  Filled 2011-01-24: qty 2

## 2011-01-24 SURGICAL SUPPLY — 13 items
ADAPTER CATH URET PLST 4-6FR (CATHETERS) ×2 IMPLANT
ADPR CATH URET STRL DISP 4-6FR (CATHETERS) ×1
BAG URO CATCHER STRL LF (DRAPE) ×2 IMPLANT
CATH INTERMIT  6FR 70CM (CATHETERS) ×2 IMPLANT
CLOTH BEACON ORANGE TIMEOUT ST (SAFETY) ×2 IMPLANT
DRAPE CAMERA CLOSED 9X96 (DRAPES) ×2 IMPLANT
GLOVE BIOGEL M STRL SZ7.5 (GLOVE) ×2 IMPLANT
GOWN STRL NON-REIN LRG LVL3 (GOWN DISPOSABLE) ×2 IMPLANT
GOWN STRL REIN XL XLG (GOWN DISPOSABLE) ×2 IMPLANT
GUIDEWIRE STR DUAL SENSOR (WIRE) ×2 IMPLANT
MANIFOLD NEPTUNE II (INSTRUMENTS) ×2 IMPLANT
PACK CYSTO (CUSTOM PROCEDURE TRAY) ×2 IMPLANT
TUBING CONNECTING 10 (TUBING) ×1 IMPLANT

## 2011-01-24 NOTE — Anesthesia Preprocedure Evaluation (Addendum)
Anesthesia Evaluation  Patient identified by MRN, date of birth, ID band Patient awake    Reviewed: Allergy & Precautions, H&P , NPO status , Patient's Chart, lab work & pertinent test results  Airway Mallampati: II TM Distance: <3 FB Neck ROM: Full    Dental  (+) Missing and Dental Advisory Given   Pulmonary  clear to auscultation        Cardiovascular hypertension, Pt. on medications Regular Normal    Neuro/Psych    GI/Hepatic GERD-  Controlled,  Endo/Other    Renal/GU      Musculoskeletal   Abdominal   Peds  Hematology   Anesthesia Other Findings   Reproductive/Obstetrics                         Anesthesia Physical Anesthesia Plan  ASA: II  Anesthesia Plan: General   Post-op Pain Management:    Induction: Intravenous  Airway Management Planned: LMA  Additional Equipment:   Intra-op Plan:   Post-operative Plan: Extubation in OR  Informed Consent: I have reviewed the patients History and Physical, chart, labs and discussed the procedure including the risks, benefits and alternatives for the proposed anesthesia with the patient or authorized representative who has indicated his/her understanding and acceptance.   Dental advisory given  Plan Discussed with: CRNA  Anesthesia Plan Comments:         Anesthesia Quick Evaluation

## 2011-01-24 NOTE — Plan of Care (Signed)
Problem: Consults Goal: General Medical Patient Education See Patient Education Module for specific education.  See echart for previous care plan documentation

## 2011-01-24 NOTE — Transfer of Care (Signed)
Immediate Anesthesia Transfer of Care Note  Patient: Jose [name]  Procedure(s) Performed:  CYSTOSCOPY WITH RETROGRADE PYELOGRAM/URETERAL STENT PLACEMENT  Patient Location: PACU  Anesthesia Type: General  Level of Consciousness: awake, oriented, patient cooperative and responds to stimulation  Airway & Oxygen Therapy: Patient Spontanous Breathing and Patient connected to face mask oxygen  Post-op Assessment: Report given to PACU RN, Post -op Vital signs reviewed and stable and Patient moving all extremities X 4  Post vital signs: stable  Complications: No apparent anesthesia complications

## 2011-01-24 NOTE — Op Note (Signed)
Jose [name], Jose [name] NO.:  [id number]  MEDICAL RECORD NO.:  [id number]  LOCATION:  [facility]                         FACILITY:  [facility]  PHYSICIAN:  Jerilee Field, MD   DATE OF BIRTH:  Sep 19, 1927  DATE OF PROCEDURE: DATE OF DISCHARGE:  01/23/2011                              OPERATIVE REPORT   PREOPERATIVE DIAGNOSES: 1. Acute renal failure. 2. Right hydronephrosis. 3. Right nephrolithiasis. 4. Right ureteral stone.  POSTOPERATIVE DIAGNOSES: 1. Acute renal failure. 2. Right hydronephrosis. 3. Right nephrolithiasis. 4. Right ureteral stone.  PROCEDURE: 1. Cystoscopy. 2. Right retrograde pyelogram and interpretation. 3. Right ureteral stent placement.  SURGEON:  Jerilee Field, MD  INDICATION FOR PROCEDURE:  Jose [name] is an 77 year old male, who had severe onset of acute abdominal pain which was sharp and colicky.  He also had renal failure with creatinine rising from baseline to 1.9 and then up to 2.7 today with moderate right hydronephrosis.  I discussed the labs and CT findings with the patient.  CT revealed a large 1 cm right ureteropelvic junction stone with moderate hydronephrosis.  We discussed the nature, risks, benefits, and alternatives to cystoscopy and right ureteral stent placement.  All questions were answered and the patient and his wife elected to proceed with cystoscopy and right ureteral stent placement in the face of obstruction and renal failure. Interpretation of retrograde pyelogram on scout imaging, an approximate 1 cm stone was seen in the region of the right UPJ.  Further up and more lateral was another large calcification approximately 1 cm seen in the right mid pole of the kidney.  After retrograde injection, the ureter was of a normal caliber and course up to the right UPJ where contrast outlined the stone initially and was slow to pass.  Under fluoro, the stone at the right UPJ became a filling defect with moderate dilation  of the upper and middle calices.  There is also a filling defect in the right mid pole where the right mid pole stone was located.  With contrast going into the kidney, the stone at the right UPJ popped out and went more proximal in the renal pelvis.  The ureter was normal apart from several small outpouchings consistent with several small ureteral diverticula.  Wire was advanced up into the right upper pole and a 6 x 26 stent was advanced and the wire was removed.  Good coil was seen up in the right renal pelvis on fluoroscopy, a good coil in the bladder cystoscopically.  DESCRIPTION OF PROCEDURE:  After consent was obtained, the patient brought to the operating room.  A time-out was performed to confirm the patient and procedure.  SCDs and preop antibiotics were given.  I did examine the patient under anesthesia.  The prostate was mildly enlarged. The right side of the prostate was more prominent and firm with a focal sharp nodule at the right apex extending out from the prostate.  This need to be correlated with PSA levels and prior biopsy histories which we can follow up in the outpatient.  Rigid cystoscope was then advanced per urethra.  The prostate had moderate enlargement in the large median lobe.  There were no  stones or tumors in the bladder.  The right ureteral orifice was visualized, it was cannulated with a 6-French open- ended catheter and a right retrograde was obtained with injection of contrast.  Findings as previously dictated.  Sensor wire was then guided through the ureter up into the right upper pole with the aid of the 6- French catheter and a 6-French catheter was then removed and a 6 x 26 cm stent was advanced.  The wire was partially removed and a good coil was seen in the kidney and completely removed.  A good coil was seen in the bladder with drainage of urine and contrast through the stent.  The patient's bladder was drained and the scope was  removed.  ESTIMATED BLOOD LOSS:  Zero.  COMPLICATIONS:  None.  DRAINS:  6 x 26 cm right ureteral stent.  SPECIMENS:  None.  DISPOSITION:  The patient stable to PACU.          ______________________________ Jerilee Field, MD     ME/MEDQ  D:  01/24/2011  T:  01/24/2011  Job:  [id number]  Electronically Signed by Jerilee Field MD on 01/27/2011 07:19:42 PM

## 2011-01-24 NOTE — Progress Notes (Addendum)
Subjective:  seen after stent placement, still with moderate pain at the right flank , no BM for 3 days , no nausea or vomiting  Objective: Weight change:   Intake/Output Summary (Last 24 hours) at 01/24/11 1502 Last data filed at 01/24/11 1230  Gross per 24 hour  Intake    650 ml  Output    575 ml  Net     75 ml   Blood pressure 192/70, pulse 65, temperature 99.6 F (37.6 C), temperature source Oral, resp. rate 18, height 6' (1.829 m), weight 81.012 kg (178 lb 9.6 oz), SpO2 95.00%.   Physical Exam: General: Alert and awake oriented x3 not in any acute distress. HEENT: anicteric sclera, pupils reactive to light and accommodation CVS: S1-S2 clear no murmur rubs or gallops Chest: clear to auscultation bilaterally, no wheezing rales or rhonchi Abdomen: soft nontender,distended, normal bowel sounds, no organomegaly Extremities: no cyanosis, clubbing or edema noted bilaterally Neuro: Cranial nerves II-XII intact, no focal neurological deficits  Lab Results: Results for orders placed during the hospital encounter of 01/23/11 (from the past 48 hour(s))  BASIC METABOLIC PANEL     Status: Abnormal   Collection Time   01/24/11  7:39 AM      Component Value Range Comment   Sodium 139  135 - 145 (mEq/L)    Potassium 5.3 (*) 3.5 - 5.1 (mEq/L) MODERATE HEMOLYSIS   Chloride 106  96 - 112 (mEq/L)    CO2 26  19 - 32 (mEq/L)    Glucose, Bld 113 (*) 70 - 99 (mg/dL)    BUN 25 (*) 6 - 23 (mg/dL)    Creatinine, Ser 0.98 (*) 0.50 - 1.35 (mg/dL)    Calcium 8.7  8.4 - 10.5 (mg/dL)    GFR calc non Af Amer 20 (*) >90 (mL/min)    GFR calc Af Amer 24 (*) >90 (mL/min)   CBC     Status: Normal   Collection Time   01/24/11  7:39 AM      Component Value Range Comment   WBC 8.9  4.0 - 10.5 (K/uL) WHITE COUNT CONFIRMED ON SMEAR   RBC 4.70  4.22 - 5.81 (MIL/uL)    Hemoglobin 14.1  13.0 - 17.0 (g/dL)    HCT 11.9  14.7 - 82.9 (%)    MCV 88.9  78.0 - 100.0 (fL)    MCH 30.0  26.0 - 34.0 (pg)    MCHC 33.7   30.0 - 36.0 (g/dL)    RDW 56.2  13.0 - 86.5 (%)    Platelets 220  150 - 400 (K/uL)   HEMOGLOBIN A1C     Status: Abnormal   Collection Time   01/24/11  7:39 AM      Component Value Range Comment   Hemoglobin A1C 6.1 (*) <5.7 (%)    Mean Plasma Glucose 128 (*) <117 (mg/dL)     Micro Results: No results found for this or any previous visit (from the past 240 hour(s)).  Studies/Results: Ct Abdomen Pelvis Wo Contrast    IMPRESSION: 1 cm stone at the right ureteral pelvic junction leads to mild to moderate hydronephrosis.  Original Report Authenticated By:     Medications: Scheduled Meds:   . antiseptic oral rinse  15 mL Mouth Rinse Custom  . ceFAZolin (ANCEF) IV  1 g Intravenous Once  . chlorhexidine  15 mL Mouth/Throat BID  . ciprofloxacin  250 mg Oral BID  . polyethylene glycol  17 g Oral Daily   Continuous Infusions:   .  sodium chloride 75 mL/hr at 01/23/11 2015   PRN Meds:.acetaminophen, diphenhydrAMINE, fentaNYL, hydrALAZINE, morphine injection, ondansetron, ondansetron, oxyCODONE, promethazine, DISCONTD: diphenhydrAMINE, DISCONTD: fentaNYL, DISCONTD: iohexol, DISCONTD: promethazine, DISCONTD: sodium chloride, DISCONTD: sterile water  Assessment/Plan: Active Problems:  1- Nephrolithiasis: sp cytoscopy and retrograde urethral stent placement today . Doing better  continue IV fluid. 2- ARF ON CHRONIC : continue IV fluid for now. Repeat bmet in am 3- BPH (benign prostatic hyperplasia 4-hyperkalemia .ckeck in am. 5-constipation will add laxative , 6- HTN and seems uncontrol will hold HCTZ and lisinopril for now. Continue with hydralazine. PT  And start activity    LOS: 1 day   Ismahan Lippman I. 01/24/2011, 3:02 PM

## 2011-01-24 NOTE — Consult Note (Signed)
PRIMARY CARE PHYSICIAN: Gordy Savers, MD and  [facility].   REASON FOR Consult: Right hydronephrosis and UPJ stone. Requesting MD: Dr. Ignacia Palma  HISTORY OF PRESENT ILLNESS: [name] is 77 year old male with past  medical history of hypertension and diverticulosis. The patient came in  to the hospital complaining about abdominal pain. The patient said he  was in his usual state of health till Friday night around midnight. He had severe abdominal pain, woke him up from sleep. Initially, he thought it might be urge to go to the bathroom but he did  go and the pain did not get better, so he came in to the hospital for  further evaluation. The pain is 10/10, sharp and intermittent pain. It  was 10/10 initially and then went down to 6/10. No radiation. Did not  notice anything increases or decreases the pain. He denies flank pain, but has chronic neck, shoulder and back pain. He has no dysuria. He has nausea, but no emesis. Denies flatus or BM. He has constipation on a regular basis and takes laxatives.  No history of kidney stones. He has seen [facility] for BPH and nodular prostate -- for "lump on the right". He reports he had a prostate biopsy 8-10 yrs ago.   The patient did have a CT scan in ED which showed moderate right-sided hydronephrosis with 1 cm right-sided ureteropelvic junction stone. Also has a 9 mm RMP stone.    PAST MEDICAL HISTORY:  1. Hypertension.  2. Diverticulosis.  3. Hypercholesterolemia.  4. Tic douloureux.  5. History of lower GI bleed secondary to diverticular bleed.  6. History of melanoma and decompression on microdissection of  trigeminal neuralgia.  7. Dyslipidemia.  8. Breast cancer with lumpectomy and chemotherapy in 1988.   ALLERGIES: CODEINE, SULFA, MORPHINE, DILAUDID   FAMILY HISTORY: Mother and father died in their 91s secondary to heart  problems.   SOCIAL HISTORY: The patient used to smoke when he was young for about  20  years, was not heavy smoker. No history of recreational drug use or  alcohol use. Married, lives with wife  HOME MEDICATIONS:  1. Lisinopril 40 mg p.o. daily.  2. Gabapentin 600 mg p.o. b.i.d.  3. Lorazepam 0.5 mg p.r.n. for anxiety.  Hospital meds reviewed.  REVIEW OF SYSTEMS: GENERAL: Denies fever, chills. Denies blurry  vision. ENT: Denies abnormalities. CARDIOVASCULAR: Denies  palpitations or chest pain. RESPIRATORY: Denies shortness of breath or  cough. GI: Has nausea and abdominal pain. No vomiting. No diarrhea.  GU: No burning while passing urine. No hematuria. MUSCULOSKELETAL:  No joint deformity. SKIN: Denies any rash. NEURO: Denies any  headache or focal weaknesses. PSYCH: Denies any mood abnormalities.  PE: Vitals reviewed - 97.6, 136/62. 100%, hr 55, rr 18 Gen - Looks in mild pain, sitting on edge of bed Neuro - AandOx3, no focal deficits Head - normocephalic Neck - no mass, supple CV - RRR Lungs - regular rate, depth, effort Adb - mild distention, soft, NTTP, no R/G Back - no CVAT GU - normal penis and testes without masses DRE - prostate enlarged, nodule/irregular prostate on right Ext - no CCE  DATA: wbc 9, hct 41, K 5.3, bun 25, cr 2.7 (up from 1.9)  CT - I reviewed images, findings as above per HPI.  Impression: -ARF -Right Nephrolithiasis -Right UPJ stone -Right hydronephrosis  Plan: Discussed with patient CT and labs findings. Discussed his pain is not consistent with renal colic, but he has long  history of pain. Concern that his Cr has gone up despite IVF and he has moderate right hydronephrosis. Therefore, I discussed with the patient the nature, potential benefits, risks and alternatives to cystoscopy/right ureteral stent placement, including side effects of the proposed treatment, the likelihood of the patient achieving the goals of the procedure, and any potential problems that might occur during the procedure or recuperation. We discussed  nature/quality of stent pain/discomfort among others. All questions answered. Patient elects proceed for cysto/right stent. We discussed importance of f/u with after stent placement. We discussed he will need another surgery to deal with the stones as this procedure is meant to relieve renal obstruction.

## 2011-01-24 NOTE — Brief Op Note (Signed)
01/23/2011 - 01/24/2011  10:52 AM  PATIENT:  Jose [name]  78 y.o. male  PRE-OPERATIVE DIAGNOSIS:  right ureteral stone  POST-OPERATIVE DIAGNOSIS:  right ureteral stone  PROCEDURE:  Procedure(s): CYSTOSCOPY WITH RETROGRADE PYELOGRAM and interpretation and right URETERAL STENT PLACEMENT  SURGEON:  Surgeon(s): Antony Haste, MD  PHYSICIAN ASSISTANT:   ASSISTANTS: none   ANESTHESIA:   general  EBL:  Total I/O In: 400 [I.V.:400] Out: 200 [Urine:200]  BLOOD ADMINISTERED:none  DRAINS: 6 x 26 cm right ureteral stent  SPECIMEN:  No Specimen  DISPOSITION OF SPECIMEN:  N/A  COUNTS:  YES  TOURNIQUET:  * No tourniquets in log *  DICTATION: .Other Dictation: Dictation Number [id number]  PLAN OF CARE: Admit to inpatient   PATIENT DISPOSITION:  PACU - hemodynamically stable.   Delay start of Pharmacological VTE agent (>24hrs) due to surgical blood loss or risk of bleeding:  no

## 2011-01-24 NOTE — Progress Notes (Signed)
GU Post-op Note  Patient status post cystoscopy right ureteral stent placement. When medically stable may D/C home and F/U with Dr. Mena Goes, [facility], [phone], 1-2 weeks. Needs prophylactic Cipro 250 mg BID for 3 - 5 days post-op.

## 2011-01-24 NOTE — Anesthesia Postprocedure Evaluation (Signed)
  Anesthesia Post-op Note  Patient: Jose [name]  Procedure(s) Performed:  CYSTOSCOPY WITH RETROGRADE PYELOGRAM/URETERAL STENT PLACEMENT  Patient Location: PACU  Anesthesia Type: General  Level of Consciousness: awake  Airway and Oxygen Therapy: Patient connected to nasal cannula oxygen  Post-op Pain: none  Post-op Assessment: Post-op Vital signs reviewed, Patient's Cardiovascular Status Stable, Respiratory Function Stable, No signs of Nausea or vomiting and Pain level controlled  Post-op Vital Signs: stable  Complications: No apparent anesthesia complications

## 2011-01-24 NOTE — Anesthesia Procedure Notes (Signed)
Procedure Name: LMA Insertion Date/Time: 01/24/2011 10:07 AM Performed by: Zebedee Iba Pre-anesthesia Checklist: Patient identified, Emergency Drugs available, Suction available, Patient being monitored and Timeout performed Patient Re-evaluated:Patient Re-evaluated prior to inductionOxygen Delivery Method: Circle System Utilized Preoxygenation: Pre-oxygenation with 100% oxygen Intubation Type: IV induction Ventilation: Mask ventilation without difficulty LMA: LMA inserted LMA Size: 4.0 Number of attempts: 1 Placement Confirmation: positive ETCO2 and breath sounds checked- equal and bilateral Tube secured with: Tape Dental Injury: Teeth and Oropharynx as per pre-operative assessment

## 2011-01-25 ENCOUNTER — Encounter (HOSPITAL_COMMUNITY): Payer: Self-pay | Admitting: Emergency Medicine

## 2011-01-25 LAB — URINALYSIS, ROUTINE W REFLEX MICROSCOPIC
Glucose, UA: NEGATIVE mg/dL
Specific Gravity, Urine: 1.019 (ref 1.005–1.030)
pH: 6 (ref 5.0–8.0)

## 2011-01-25 LAB — BASIC METABOLIC PANEL
Calcium: 9 mg/dL (ref 8.4–10.5)
GFR calc non Af Amer: 38 mL/min — ABNORMAL LOW (ref >90)
Glucose, Bld: 142 mg/dL — ABNORMAL HIGH (ref 70–99)
Sodium: 137 mEq/L (ref 135–145)

## 2011-01-25 LAB — URINE CULTURE: Special Requests: NEGATIVE

## 2011-01-25 MED ORDER — TRAVOPROST (BAK FREE) 0.004 % OP SOLN
1.0000 [drp] | Freq: Every day | OPHTHALMIC | Status: DC
  Administered 2011-01-25 – 2011-01-26 (×2): 1 [drp] via OPHTHALMIC
  Filled 2011-01-25: qty 2.5

## 2011-01-25 MED ORDER — AMLODIPINE BESYLATE 10 MG PO TABS
10.0000 mg | ORAL_TABLET | Freq: Every day | ORAL | Status: DC
  Administered 2011-01-25 – 2011-01-27 (×3): 10 mg via ORAL
  Filled 2011-01-25 (×4): qty 1

## 2011-01-25 MED ORDER — HYDRALAZINE HCL 20 MG/ML IJ SOLN
5.0000 mg | Freq: Four times a day (QID) | INTRAMUSCULAR | Status: DC | PRN
  Administered 2011-01-25 (×2): 5 mg via INTRAVENOUS
  Filled 2011-01-25 (×3): qty 1

## 2011-01-25 MED ORDER — GABAPENTIN 300 MG PO CAPS
600.0000 mg | ORAL_CAPSULE | Freq: Two times a day (BID) | ORAL | Status: DC
  Administered 2011-01-25 – 2011-01-27 (×4): 600 mg via ORAL
  Filled 2011-01-25 (×6): qty 2

## 2011-01-25 MED ORDER — GABAPENTIN 600 MG PO TABS
600.0000 mg | ORAL_TABLET | Freq: Two times a day (BID) | ORAL | Status: DC
  Filled 2011-01-25: qty 1

## 2011-01-25 MED ORDER — CIPROFLOXACIN IN D5W 400 MG/200ML IV SOLN
400.0000 mg | Freq: Two times a day (BID) | INTRAVENOUS | Status: DC
Start: 2011-01-25 — End: 2011-01-27
  Administered 2011-01-25 – 2011-01-27 (×4): 400 mg via INTRAVENOUS
  Filled 2011-01-25 (×7): qty 200

## 2011-01-25 MED ORDER — HYDRALAZINE HCL 20 MG/ML IJ SOLN
INTRAMUSCULAR | Status: AC
  Administered 2011-01-25: 5 mg via INTRAVENOUS
  Filled 2011-01-25: qty 1

## 2011-01-25 MED ORDER — ONDANSETRON HCL 4 MG/2ML IJ SOLN
INTRAMUSCULAR | Status: AC
  Filled 2011-01-25: qty 2

## 2011-01-25 MED ORDER — HYDRALAZINE HCL 20 MG/ML IJ SOLN: 10.0000 mg | Freq: Four times a day (QID) | INTRAMUSCULAR | Status: DC | PRN

## 2011-01-25 MED ORDER — LORAZEPAM 2 MG/ML IJ SOLN
0.5000 mg | Freq: Once | INTRAMUSCULAR | Status: AC
  Administered 2011-01-25: 0.5 mg via INTRAVENOUS

## 2011-01-25 MED ORDER — HYDRALAZINE HCL 20 MG/ML IJ SOLN
10.0000 mg | Freq: Once | INTRAMUSCULAR | Status: AC
  Administered 2011-01-25: 10 mg via INTRAVENOUS

## 2011-01-25 MED ORDER — POLYETHYLENE GLYCOL 3350 17 G PO PACK
17.0000 g | PACK | Freq: Two times a day (BID) | ORAL | Status: DC
  Administered 2011-01-25: 22:00:00 via ORAL
  Administered 2011-01-26 – 2011-01-27 (×2): 17 g via ORAL
  Filled 2011-01-25 (×6): qty 1

## 2011-01-25 MED ORDER — HYDRALAZINE HCL 20 MG/ML IJ SOLN
INTRAMUSCULAR | Status: AC
  Filled 2011-01-25: qty 1

## 2011-01-25 MED ORDER — HYDRALAZINE HCL 20 MG/ML IJ SOLN
5.0000 mg | Freq: Once | INTRAMUSCULAR | Status: AC
  Administered 2011-01-25: 5 mg via INTRAVENOUS

## 2011-01-25 MED ORDER — ONDANSETRON HCL 4 MG/2ML IJ SOLN
INTRAMUSCULAR | Status: AC
  Administered 2011-01-25: 4 mg via INTRAVENOUS
  Filled 2011-01-25: qty 2

## 2011-01-25 MED ORDER — HYDRALAZINE HCL 20 MG/ML IJ SOLN: 5.0000 mg | Freq: Four times a day (QID) | INTRAMUSCULAR | Status: DC | PRN

## 2011-01-25 MED ORDER — HYDRALAZINE HCL 20 MG/ML IJ SOLN: 5.0000 mg | Freq: Once | INTRAMUSCULAR | Status: DC

## 2011-01-25 MED ORDER — BRIMONIDINE TARTRATE 0.2 % OP SOLN
1.0000 [drp] | Freq: Two times a day (BID) | OPHTHALMIC | Status: DC
  Administered 2011-01-25 – 2011-01-27 (×4): 1 [drp] via OPHTHALMIC
  Filled 2011-01-25: qty 5

## 2011-01-25 MED ORDER — CLONIDINE HCL 0.2 MG/24HR TD PTWK
0.2000 mg | MEDICATED_PATCH | TRANSDERMAL | Status: DC
  Administered 2011-01-25: 0.2 mg via TRANSDERMAL
  Filled 2011-01-25: qty 1

## 2011-01-25 MED ORDER — LORAZEPAM 2 MG/ML IJ SOLN
INTRAMUSCULAR | Status: AC
  Administered 2011-01-25: 0.5 mg via INTRAVENOUS
  Filled 2011-01-25: qty 1

## 2011-01-25 NOTE — Progress Notes (Signed)
PT VOMITING HAS RECEIVED IV ZOFRAN AT 0221, M [facility] NP FOR HOSPITALIST PAGED, ORDERS RECEIVED TO GIVE PT IV ATIVAN.  DISCUSSED COURSE OF ACTION WITH PT AND WIFE.  IV GIVEN, PT PLACED BACK IN BED WIFE AT BEDSIDE

## 2011-01-25 NOTE — Progress Notes (Signed)
Jose [name] , NP notified pt bp remains elevated at 190/90 manually.  Pt has received a total of 10 mg iv apresoline previously, discussed pt home meds.  Lynch to place additional order for iv hydralazine order.

## 2011-01-25 NOTE — Progress Notes (Addendum)
Subjective: Feels "Fluey" and has not moved bowels since lat 4 days. Appetite is poor, but has no difficulty keeping food down.  Objective: Vital signs in last 24 hours: Temp:  [97.9 F (36.6 C)-98.3 F (36.8 C)] 98.3 F (36.8 C) (11/05 1346) Pulse Rate:  [62-77] 77  (11/05 1346) Resp:  [18] 18  (11/05 1346) BP: (179-211)/(74-98) 184/82 mmHg (11/05 1346) SpO2:  [0 %-97 %] 0 % (11/05 1346) Weight change:  Last BM Date: 01/21/11  Intake/Output from previous day: 11/04 0701 - 11/05 0700 In: 3466 [I.V.:3466] Out: 1551 [Urine:1550; Emesis/NG output:1] Total I/O In: 720 [P.O.:120; I.V.:600] Out: 400 [Urine:400]   Physical Exam:  General: Alert, communicative, fully oriented, not short of breath at rest, feel unwell with generalized aches and pains. HEENT:  No clinical pallor, no jaundice, no conjunctival injection or discharge. Hydration appears fair. NECK:  Supple, JVP not seen, no carotid bruits, no palpable lymphadenopathy, no palpable goiter. CHEST:  Clinically clear to auscultation, no wheezes, no crackles. HEART:  Sounds 1 and 2 heard, normal, regular, no murmurs. ABDOMEN:  Full, soft, no scars, non-tender, no palpable organomegaly, no palpable masses, normal bowel sounds. LOWER EXTREMITIES:  Mild pitting edema, palpable peripheral pulses. MUSCULOSKELETAL SYSTEM:  Generalized osteoarthritic changes, otherwise, normal. CENTRAL NERVOUS SYSTEM:  No focal neurologic deficit on gross examination.  Lab Results:  Basename 01/24/11 0739 01/23/11 0811 01/23/11 0744  WBC 8.9 -- 9.3  HGB 14.1 15.6 --  HCT 41.8 46.0 --  PLT 220 -- 231    Basename 01/25/11 0757 01/24/11 0739  NA 137 139  K 4.7 5.3*  CL 106 106  CO2 16* 26  GLUCOSE 142* 113*  BUN 21 25*  CREATININE 1.62* 2.73*  CALCIUM 9.0 8.7   Recent Results (from the past 240 hour(s))  URINE CULTURE     Status: Normal   Collection Time   01/23/11  7:42 AM      Component Value Range Status Comment   Specimen  Description URINE, CLEAN CATCH   Final    Special Requests NONE   Final    Setup Time [id number]   Final    Colony Count NO GROWTH   Final    Culture NO GROWTH   Final    Report Status 01/24/2011 FINAL   Final   URINE CULTURE     Status: Normal   Collection Time   01/23/11 11:59 PM      Component Value Range Status Comment   Specimen Description URINE, CLEAN CATCH   Final    Special Requests IMMUNE:NORM UT SYMPT:NEG   Final    Setup Time [id number]   Final    Colony Count NO GROWTH   Final    Culture NO GROWTH   Final    Report Status 01/25/2011 FINAL   Final      Studies/Results: No results found.  Medications: Scheduled Meds:   . antiseptic oral rinse  15 mL Mouth Rinse Custom  . chlorhexidine  15 mL Mouth/Throat BID  . ciprofloxacin  250 mg Oral BID  . cloNIDine  0.2 mg Transdermal Weekly  . hydrALAZINE      . hydrALAZINE  10 mg Intravenous Once  . hydrALAZINE  5 mg Intravenous Once  . hydrALAZINE  5 mg Intravenous Once  . LORazepam  0.5 mg Intravenous Once  . LORazepam  0.5 mg Oral BID  . multivitamins ther. w/minerals  1 tablet Oral Daily  . ondansetron      . polyethylene glycol  17 g Oral Daily  . vitamin C  500 mg Oral Daily   Continuous Infusions:   . sodium chloride 75 mL/hr at 01/25/11 1433   PRN Meds:.acetaminophen, diphenhydrAMINE, hydrALAZINE, morphine injection, ondansetron, ondansetron, oxyCODONE, DISCONTD: fentaNYL, DISCONTD: hydrALAZINE, DISCONTD: hydrALAZINE, DISCONTD: promethazine  Assessment/Plan:  Active Problems: 1.  Nephrolithiasis: Status post cystoscopy/right ureteric stent by Dr. Mena Goes on 12/24/2010. On prophylactic Ciprofloxacin, day # 2  2. BPH (benign prostatic hyperplasia): Not problematic.  3. ARF (acute renal failure). Due to dehydration/Hydronephrosis. Improved. Will continue ivi fluids for now, and follow renal indices. 4. Constipation: Adjust laxatives/Change Miralax to B.I.D.  Note:  May shower. Advance diet.    LOS:  2 days   Jose [name],CHRISTOPHER 01/25/2011, 4:58 PM   Will do blood cultures, urine culture, and change Ciprofloxacin to iv.

## 2011-01-25 NOTE — Progress Notes (Signed)
Jose [name] notified that pt bp elevated, apresoline iv given bp reassessed elevated at 188/89. Orders received for additional iv meds.

## 2011-01-26 LAB — URINE CULTURE
Colony Count: NO GROWTH
Culture  Setup Time: 201211060120

## 2011-01-26 MED ORDER — CLONIDINE HCL 0.3 MG/24HR TD PTWK: 0.3000 mg | MEDICATED_PATCH | TRANSDERMAL | Status: DC

## 2011-01-26 MED ORDER — CIPROFLOXACIN HCL 250 MG PO TABS
250.0000 mg | ORAL_TABLET | Freq: Two times a day (BID) | ORAL | Status: DC
  Administered 2011-01-27: 250 mg via ORAL
  Filled 2011-01-26 (×2): qty 1

## 2011-01-26 NOTE — Progress Notes (Signed)
Subjective: Feels quite well. No new issues  Objective: Vital signs in last 24 hours: Temp:  [98 F (36.7 C)-98.8 F (37.1 C)] 98 F (36.7 C) (11/06 1420) Pulse Rate:  [63-78] 63  (11/06 1420) Resp:  [18] 18  (11/06 1420) BP: (157-197)/(79-83) 157/82 mmHg (11/06 1420) SpO2:  [94 %-98 %] 98 % (11/06 1420) Weight change:  Last BM Date: 01/22/11  Intake/Output from previous day: 11/05 0701 - 11/06 0700 In: 2538.8 [P.O.:380; I.V.:1758.8; IV Piggyback:400] Out: 600 [Urine:600] Total I/O In: 320 [P.O.:320] Out: -    Physical Exam: General: Alert, communicative, fully oriented, not short of breath at rest.  HEENT: No clinical pallor, no jaundice, no conjunctival injection or discharge. Hydration appears fair.  NECK: Supple, JVP not seen, no carotid bruits, no palpable lymphadenopathy, no palpable goiter.  CHEST: Clinically clear to auscultation, no wheezes, no crackles.  HEART: Sounds 1 and 2 heard, normal, regular, no murmurs.  ABDOMEN: Full, soft, no scars, non-tender, no palpable organomegaly, no palpable masses, normal bowel sounds.  LOWER EXTREMITIES: Minimal pitting edema, palpable peripheral pulses.  MUSCULOSKELETAL SYSTEM: Generalized osteoarthritic changes, otherwise, normal.  CENTRAL NERVOUS SYSTEM: No focal neurologic deficit on gross examination.  Lab Results:  [facility] 01/24/11 0739  WBC 8.9  HGB 14.1  HCT 41.8  PLT 220    Basename 01/25/11 0757 01/24/11 0739  NA 137 139  K 4.7 5.3*  CL 106 106  CO2 16* 26  GLUCOSE 142* 113*  BUN 21 25*  CREATININE 1.62* 2.73*  CALCIUM 9.0 8.7   Recent Results (from the past 240 hour(s))  URINE CULTURE     Status: Normal   Collection Time   01/23/11  7:42 AM      Component Value Range Status Comment   Specimen Description URINE, CLEAN CATCH   Final    Special Requests NONE   Final    Setup Time [id number]   Final    Colony Count NO GROWTH   Final    Culture NO GROWTH   Final    Report Status 01/24/2011 FINAL    Final   URINE CULTURE     Status: Normal   Collection Time   01/23/11 11:59 PM      Component Value Range Status Comment   Specimen Description URINE, CLEAN CATCH   Final    Special Requests IMMUNE:NORM UT SYMPT:NEG   Final    Setup Time [id number]   Final    Colony Count NO GROWTH   Final    Culture NO GROWTH   Final    Report Status 01/25/2011 FINAL   Final   CULTURE, BLOOD (ROUTINE X 2)     Status: Normal (Preliminary result)   Collection Time   01/25/11  7:00 PM      Component Value Range Status Comment   Specimen Description BLOOD LEFT HAND   Final    Special Requests BOTTLES DRAWN AEROBIC AND ANAEROBIC 5CC   Final    Setup Time [id number]   Final    Culture     Final    Value:        BLOOD CULTURE RECEIVED NO GROWTH TO DATE CULTURE WILL BE HELD FOR 5 DAYS BEFORE ISSUING A FINAL NEGATIVE REPORT   Report Status PENDING   Incomplete   CULTURE, BLOOD (ROUTINE X 2)     Status: Normal (Preliminary result)   Collection Time   01/25/11  7:10 PM      Component Value Range Status Comment  Specimen Description BLOOD LEFT HAND   Final    Special Requests BOTTLES DRAWN AEROBIC AND ANAEROBIC 5CC   Final    Setup Time [id number]   Final    Culture     Final    Value:        BLOOD CULTURE RECEIVED NO GROWTH TO DATE CULTURE WILL BE HELD FOR 5 DAYS BEFORE ISSUING A FINAL NEGATIVE REPORT   Report Status PENDING   Incomplete      Studies/Results: No results found.  Medications: Scheduled Meds:   . amLODipine  10 mg Oral Daily  . antiseptic oral rinse  15 mL Mouth Rinse Custom  . brimonidine  1 drop Both Eyes BID  . chlorhexidine  15 mL Mouth/Throat BID  . ciprofloxacin  400 mg Intravenous Q12H  . cloNIDine  0.2 mg Transdermal Weekly  . gabapentin  600 mg Oral BID  . hydrALAZINE  5 mg Intravenous Once  . LORazepam  0.5 mg Oral BID  . multivitamins ther. w/minerals  1 tablet Oral Daily  . ondansetron      . polyethylene glycol  17 g Oral BID  . Travoprost (BAK Free)  1 drop  Both Eyes QHS  . vitamin C  500 mg Oral Daily  . DISCONTD: gabapentin  600 mg Oral BID   Continuous Infusions:   . sodium chloride 75 mL/hr at 01/26/11 0148   PRN Meds:.acetaminophen, hydrALAZINE, morphine injection, ondansetron, ondansetron, oxyCODONE  Assessment/Plan: Active Problems:  1. Nephrolithiasis: Status post cystoscopy/right ureteric stent by Dr. Mena Goes on 12/24/2010. On Ciprofloxacin, day # 3/7 2. BPH (benign prostatic hyperplasia): Not problematic.  3. ARF (acute renal failure). Due to dehydration/Hydronephrosis. Steady improvement. Will continue ivi fluids for now, and follow renal indices.  4. Constipation: Resolved. 5. HTN: Improved. Adjust meds further, by increasing Clonidine to 0.3 mg patch.  Comment: Feels much better today. No pyrexia, wcc is normal and renal indices have improved. Aim discharge on 01/27/11.     LOS: 3 days   Darnetta Kesselman,CHRISTOPHER 01/26/2011, 5:49 PM

## 2011-01-26 NOTE — Progress Notes (Signed)
Pt becomes intermittently confused, per family this has been happening for days, pt reorients appropriately

## 2011-01-27 ENCOUNTER — Encounter (HOSPITAL_COMMUNITY): Payer: Self-pay | Admitting: Urology

## 2011-01-27 LAB — BASIC METABOLIC PANEL
BUN: 19 mg/dL (ref 6–23)
CO2: 22 mEq/L (ref 19–32)
Chloride: 107 mEq/L (ref 96–112)
Creatinine, Ser: 1.35 mg/dL (ref 0.50–1.35)

## 2011-01-27 MED ORDER — TRAVOPROST (BAK FREE) 0.004 % OP SOLN: 1.0000 [drp] | Freq: Every day | OPHTHALMIC | Status: AC

## 2011-01-27 MED ORDER — AMLODIPINE BESYLATE 10 MG PO TABS: 10.0000 mg | ORAL_TABLET | Freq: Every day | ORAL | Status: DC

## 2011-01-27 MED ORDER — BRIMONIDINE TARTRATE 0.2 % OP SOLN: 1.0000 [drp] | Freq: Two times a day (BID) | OPHTHALMIC | Status: AC

## 2011-01-27 MED ORDER — CLONIDINE HCL 0.3 MG/24HR TD PTWK: 1.0000 | MEDICATED_PATCH | TRANSDERMAL | Status: DC

## 2011-01-27 MED ORDER — CIPROFLOXACIN HCL 250 MG PO TABS: 250.0000 mg | ORAL_TABLET | Freq: Two times a day (BID) | ORAL | Status: AC

## 2011-01-27 NOTE — Progress Notes (Signed)
Wrong time on assessment, copied information to a new column (20:45)

## 2011-01-27 NOTE — Discharge Summary (Signed)
Physician Discharge Summary  Patient ID: Jose [name] MRN: [id number] DOB/AGE: [birth year] 77 y.o.  Admit date: 01/23/2011 Discharge date: 01/27/2011  Primary Care Physician:  Rogelia Boga, MD   Discharge Diagnoses:    Patient Active Problem List  Diagnoses  . HYPERCHOLESTEROLEMIA  . HYPERLIPIDEMIA  . ANXIETY STATE, UNSPECIFIED  . TRIGEMINAL NEURALGIA  . HYPERTENSION  . DIVERTICULOSIS, COLON  . RUQ PAIN  . COLONIC POLYPS, HX OF  . Nephrolithiasis  . BPH (benign prostatic hyperplasia)  . ARF (acute renal failure)    Current Discharge Medication List    START taking these medications   Details  amLODipine (NORVASC) 10 MG tablet Take 1 tablet (10 mg total) by mouth daily. Qty: 30 tablet, Refills: 0    brimonidine (ALPHAGAN) 0.2 % ophthalmic solution Place 1 drop into both eyes 2 (two) times daily. Qty: 5 mL    ciprofloxacin (CIPRO) 250 MG tablet Take 1 tablet (250 mg total) by mouth 2 (two) times daily. Qty: 8 tablet, Refills: 0    cloNIDine (CATAPRES - DOSED IN MG/24 HR) 0.3 mg/24hr Place 1 patch (0.3 mg total) onto the skin once a week. Qty: 4 patch, Refills: 0    Travoprost, BAK Free, (TRAVATAMN) 0.004 % SOLN ophthalmic solution Place 1 drop into both eyes at bedtime.      CONTINUE these medications which have NOT CHANGED   Details  gabapentin (NEURONTIN) 300 MG capsule Take 600 mg by mouth 2 (two) times daily before lunch and supper.     LORazepam (ATIVAN) 0.5 MG tablet Take 0.5 mg by mouth 2 times daily at 12 noon and 4 pm.      Multiple Vitamins-Minerals (MULTIVITAMIN WITH MINERALS) tablet Take 1 tablet by mouth daily.      vitamin C (ASCORBIC ACID) 500 MG tablet Take 500 mg by mouth daily.        STOP taking these medications     lisinopril-hydrochlorothiazide (PRINZIDE,ZESTORETIC) 20-12.5 MG per tablet          Disposition and Follow-up:  Follow up with Dr Amador Cunas, per prior appointment, and with Dr Mena Goes, Urologist, within 1  week.  Consults:  urology Dr. Mena Goes.   Significant Diagnostic Studies:  No results found.  Brief H and P: For complete details, refer to admission H and P.  However,  in brief, this is 77 year old male with known history of hypertension and diverticulosis, presenting with abdominal pain of sudden onset, , waking  him up from sleep. On initial  evaluation in the ED,  CT scan showed right-sided hydronephrosis with 1 cm right-sided ureteropelvic junction stone. The patient was admitted for further evaluation, investigation and management.    Physical Exam: On 01/27/11 General: Alert, communicative, fully oriented, not short of breath at rest.  HEENT: No clinical pallor, no jaundice, no conjunctival injection or discharge. Hydration appears fair.  NECK: Supple, JVP not seen, no carotid bruits, no palpable lymphadenopathy, no palpable goiter.  CHEST: Clinically clear to auscultation, no wheezes, no crackles.  HEART: Sounds 1 and 2 heard, normal, regular, no murmurs.  ABDOMEN: Full, soft, no scars, non-tender, no palpable organomegaly, no palpable masses, normal bowel sounds.  LOWER EXTREMITIES: Minimal pitting edema, palpable peripheral pulses.  MUSCULOSKELETAL SYSTEM: Generalized osteoarthritic changes, otherwise, normal.  CENTRAL NERVOUS SYSTEM: No focal neurologic deficit on gross examination.  Hospital Course:  Active Problems:  1. Nephrolithiasis: This was the culprit for patient's presenting symptoms. He underwent cystoscopy/right ureteric stent by Dr. Mena Goes on 12/24/2010, in an uncomplicated procedurte. A  seven day course of Ciprofloxacin has been instituted, to be completed on 01/31/11. 2. BPH (benign prostatic hyperplasia): Not problematic.  3. ARF (acute renal failure). Due to dehydration/Hydronephrosis. He was managed with intravenous fluid hydration, and by 01/27/11, creat had normalized at 1.35. Lisinopril/HCT has been discontinued.  4. Constipation: Resolved.  5. HTN: This  was difficult to control, and was addressed with a combination of Clonidine patch and Norvasc. Further follow up and adjustment of  meds will be deferred to PMD.   Comment: Patient was asymptomatic on 01/27/11, and considered stable for discharge. He was discharged accordingly.   Time spent on Discharge: 1 hour.  Signed: Naidelyn Parrella,CHRISTOPHER 01/27/2011, 12:10 PM

## 2011-01-31 LAB — CULTURE, BLOOD (ROUTINE X 2)
Culture  Setup Time: 201211052339
Culture: NO GROWTH

## 2011-02-15 ENCOUNTER — Other Ambulatory Visit: Payer: Self-pay | Admitting: Urology

## 2011-02-26 ENCOUNTER — Encounter (HOSPITAL_COMMUNITY): Payer: Self-pay | Admitting: *Deleted

## 2011-02-26 ENCOUNTER — Encounter (HOSPITAL_COMMUNITY): Payer: Self-pay | Admitting: Pharmacy Technician

## 2011-03-01 ENCOUNTER — Encounter (HOSPITAL_COMMUNITY)
Admission: RE | Disposition: A | Discharge status: Home / Self Care | Payer: Self-pay | Source: Ambulatory Visit | Attending: Urology

## 2011-03-01 ENCOUNTER — Ambulatory Visit (HOSPITAL_COMMUNITY)
Admission: RE | Admit: 2011-03-01 | Discharge: 2011-03-01 | Disposition: A | Discharge status: Home / Self Care | Payer: Medicare Other | Source: Ambulatory Visit | Attending: Urology | Admitting: Urology

## 2011-03-01 ENCOUNTER — Ambulatory Visit (HOSPITAL_COMMUNITY): Payer: Medicare Other

## 2011-03-01 DIAGNOSIS — Z79899 Other long term (current) drug therapy: Secondary | ICD-10-CM | POA: Insufficient documentation

## 2011-03-01 DIAGNOSIS — R109 Unspecified abdominal pain: Secondary | ICD-10-CM | POA: Insufficient documentation

## 2011-03-01 DIAGNOSIS — Z85828 Personal history of other malignant neoplasm of skin: Secondary | ICD-10-CM | POA: Insufficient documentation

## 2011-03-01 DIAGNOSIS — I10 Essential (primary) hypertension: Secondary | ICD-10-CM | POA: Insufficient documentation

## 2011-03-01 DIAGNOSIS — Z853 Personal history of malignant neoplasm of breast: Secondary | ICD-10-CM | POA: Insufficient documentation

## 2011-03-01 DIAGNOSIS — N200 Calculus of kidney: Secondary | ICD-10-CM | POA: Insufficient documentation

## 2011-03-01 DIAGNOSIS — E7800 Pure hypercholesterolemia, unspecified: Secondary | ICD-10-CM | POA: Insufficient documentation

## 2011-03-01 HISTORY — DX: Unspecified osteoarthritis, unspecified site: M19.90

## 2011-03-01 HISTORY — DX: Reserved for inherently not codable concepts without codable children: IMO0001

## 2011-03-01 HISTORY — PX: EXTRACORPOREAL SHOCK WAVE LITHOTRIPSY: SHX1557

## 2011-03-01 HISTORY — DX: Essential (primary) hypertension: I10

## 2011-03-01 HISTORY — DX: Encounter for other specified aftercare: Z51.89

## 2011-03-01 SURGERY — LITHOTRIPSY, ESWL
Anesthesia: LOCAL | Laterality: Right

## 2011-03-01 MED ORDER — CIPROFLOXACIN HCL 500 MG PO TABS
500.0000 mg | ORAL_TABLET | Freq: Two times a day (BID) | ORAL | Status: DC
  Filled 2011-03-01 (×2): qty 1

## 2011-03-01 MED ORDER — OXYCODONE-ACETAMINOPHEN 5-325 MG PO TABS: 1.0000 | ORAL_TABLET | Freq: Four times a day (QID) | ORAL | Status: AC | PRN

## 2011-03-01 MED ORDER — CIPROFLOXACIN HCL 500 MG PO TABS
ORAL_TABLET | ORAL | Status: AC
  Administered 2011-03-01: 500 mg via ORAL
  Filled 2011-03-01: qty 1

## 2011-03-01 MED ORDER — DIAZEPAM 5 MG PO TABS
ORAL_TABLET | ORAL | Status: AC
  Administered 2011-03-01: 10 mg via ORAL
  Filled 2011-03-01: qty 2

## 2011-03-01 MED ORDER — DIPHENHYDRAMINE HCL 25 MG PO CAPS
25.0000 mg | ORAL_CAPSULE | ORAL | Status: AC
  Administered 2011-03-01: 25 mg via ORAL

## 2011-03-01 MED ORDER — DIAZEPAM 5 MG PO TABS
10.0000 mg | ORAL_TABLET | ORAL | Status: AC
  Administered 2011-03-01: 10 mg via ORAL

## 2011-03-01 MED ORDER — DEXTROSE-NACL 5-0.45 % IV SOLN
INTRAVENOUS | Status: DC
  Administered 2011-03-01: 17:00:00 via INTRAVENOUS

## 2011-03-01 MED ORDER — CIPROFLOXACIN HCL 500 MG PO TABS
500.0000 mg | ORAL_TABLET | ORAL | Status: AC
  Administered 2011-03-01: 500 mg via ORAL

## 2011-03-01 MED ORDER — DIPHENHYDRAMINE HCL 25 MG PO CAPS
ORAL_CAPSULE | ORAL | Status: AC
  Administered 2011-03-01: 25 mg via ORAL
  Filled 2011-03-01: qty 1

## 2011-03-01 NOTE — Progress Notes (Signed)
Pt states he has not had any aspirin , ibuprofen and toradol in the last in last 72 hours. States he took his laxative on Saturday 02/27/11  With good results but did not take it last night as directed. He states he has a problem with constipation

## 2011-03-01 NOTE — Brief Op Note (Signed)
03/01/2011  7:42 PM  PATIENT:  Jose [name]  77 y.o. male  PRE-OPERATIVE DIAGNOSIS:  Right Renal Stones  POST-OPERATIVE DIAGNOSIS:  Right renal stones  PROCEDURE:  Procedure(s): RIGHT EXTRACORPOREAL SHOCK WAVE LITHOTRIPSY (ESWL)  SURGEON:  Surgeon(s): Antony Haste, MD  PHYSICIAN ASSISTANT:   ASSISTANTS: none   ANESTHESIA:   IV sedation  EBL:     BLOOD ADMINISTERED:none  DRAINS: none   LOCAL MEDICATIONS USED:  NONE  SPECIMEN:  No Specimen  DISPOSITION OF SPECIMEN:  N/A  DICTATION: .See scanned Op note  PLAN OF CARE: Discharge to home after PACU  PATIENT DISPOSITION:  PACU - hemodynamically stable.   Delay start of Pharmacological VTE agent (>24hrs) due to surgical blood loss or risk of bleeding:  {YES/NO/NOT APPLICABLE:20182

## 2011-03-01 NOTE — H&P (Addendum)
History of Present Illness     77 yo presented with abdominal pain Nov 2012. He was found on CT to have a 1 cm UPJ stone with hydronephrosis and 1 cm RMP stone. He really had no flank pain and was observed. He has no known history of kidney stones. Despite hydration his Cr went from 1.9 up to 2.7. He underwent right ureteral stent placement Jan 24, 2011 and did well. The stones were visible on flouro. He follows up today for management of his stones.   He also told me he had a history of prostate nodule followed at the [place]. He reports a prior prostate biopsy about a year ago. On exam under anesthesia he did have a focal nodule like a calcification at the right apex. He reports he is followed at the [facility] for this and has a f/u there Feb 2012.    Today, he is well. No pain today. No fevers or emesis.   Past Medical History Problems  1. History of  Anxiety (Symptom) 300.00 2. History of  Arthritis V13.4 3. History of  Breast Cancer V10.3 4. History of  Depression 311 5. History of  Glaucoma 365.9 6. History of  Gout 274.9 7. History of  Hypercholesterolemia 272.0 8. History of  Hypertension 401.9 9. History of  Nephrolithiasis V13.01 10. History of  Skin Cancer V10.83  Surgical History Problems  1. History of  Breast Surgery Lumpectomy Right 2. History of  Cystoscopy With Insertion Of Ureteral Stent Right 3. History of  Leg Repair Left  Current Meds 1. AmLODIPine Besylate 10 MG Oral Tablet; Therapy: (Recorded:20Nov2012) to 2. Brimonidine Tartrate 0.2 % Ophthalmic Solution; Therapy: (Recorded:20Nov2012) to 3. Ciprofloxacin HCl 250 MG Oral Tablet; Therapy: (Recorded:20Nov2012) to 4. CloNIDine HCl 0.2 MG Oral Tablet; Therapy: (Recorded:20Nov2012) to 5. Gabapentin 300 MG Oral Capsule; Therapy: (Recorded:20Nov2012) to 6. Lisinopril 20 MG Oral Tablet; Therapy: (Recorded:20Nov2012) to 7. LORazepam 0.5 MG Oral Tablet; Therapy: (Recorded:20Nov2012) to 8. Travatan Z 0.004 % Ophthalmic  Solution; Therapy: (Recorded:20Nov2012) to  Allergies Medication  1. Codeine Derivatives  Family History Problems  1. Family history of  Acute Myocardial Infarction V17.3 2. Sororal history of  Alcoholism 3. Family history of  Death In The Family Father 4. Family history of  Death In The Family Mother 5. Family history of  Family Health Status Number Of Children 1 son 1 daughter 29. Family history of  Gastric Cancer V16.0  Social History Problems    Alcohol Use 1 per mo   Caffeine Use 5 per day   Former Smoker V15.82 smoked 6 cigars per day for 50yrs quit 71yrs ago   Marital History - Currently Married   Retired From Work  Review of Systems Genitourinary, constitutional, skin, eye, otolaryngeal, hematologic/lymphatic, cardiovascular, pulmonary, endocrine, musculoskeletal, gastrointestinal, neurological and psychiatric system(s) were reviewed and pertinent findings if present are noted.  Gastrointestinal: no nausea and no vomiting.  N/V was in hospital, none today  Eyes: blurred vision.  Respiratory: shortness of breath.  Endocrine: polydipsia.  Musculoskeletal: back pain and joint pain.  Neurological: dizziness.  Psychiatric: anxiety and depression.    Vitals Vital Signs [Data Includes: Last 1 Day]  20Nov2012 03:48PM  BMI Calculated: 25.06 BSA Calculated: 2.06 Height: 6 ft  Weight: 185 lb  Blood Pressure: 153 / 85 Temperature: 97 F Heart Rate: 59  Physical Exam Constitutional: Well nourished and well developed . No acute distress.  ENT:. The ears and nose are normal in appearance.  Neck: The appearance of  the neck is normal and no neck mass is present.  Pulmonary: No respiratory distress and normal respiratory rhythm and effort.  Cardiovascular: Heart rate and rhythm are normal . No peripheral edema.  Abdomen: The abdomen is soft and nontender. No masses are palpated. No CVA tenderness. No hernias are palpable. No hepatosplenomegaly noted.  Lymphatics: The  femoral and inguinal nodes are not enlarged or tender.  Skin: Normal skin turgor, no visible rash and no visible skin lesions.  Neuro/Psych:. Mood and affect are appropriate.    Results/Data     KUB - stone in RLP and RMP, stent in good position.     Plan  KUB  Status: Hold For - Appointment,Date of Service  Requested for: 20Nov2012 Ordered Today; For: Nephrolithiasis (592.0); Ordered By: Jerilee Field  Due: 22Nov2012 Marked Important   Discussion/Summary     I discussed with the patient I am concerned about prostate cancer and I can evaluate his nodular prostate with PSA and possible prostate biopsy. However, pt wants to f/u at [facility]. Again I emphasized importance of follow-up for this issue.   Discussed KUB. Renal stone was pushed back into kidney (RLP). RMP stone stable. Discussed nature, risks, benefits of doing nothing/stent removal, right URS/laser/stent, right ESWL. All questions answered. He elects ESWL. Given the LP stone is mobile, will start with this one. Discussed may need multiple procedures to clear all stones. Discussed risks of failure, infection and bleeding among others.    cc: Dr. Amador Cunas Dr. Cherie Ouch, [facility]   Signatures Electronically signed by : Jerilee Field, M.D.; Feb 10 2011  3:37PM  Patient well today. No dysuria. Seen and examined. No change in above H&P. No fever. KUB - stones visible. Discussed will start with RLP stone, may not get both stones today. I discussed again with the patient the nature, potential benefits, risks and alternatives to right ESWL, including side effects of the proposed treatment, the likelihood of the patient achieving the goals of the procedure, and any potential problems that might occur during the procedure or recuperation. All questions answered. Patient elects to proceed.  Doyne Keel, MD 03/01/2011, 6:16pm

## 2011-03-17 ENCOUNTER — Other Ambulatory Visit: Payer: Self-pay | Admitting: Urology

## 2011-03-22 ENCOUNTER — Encounter: Payer: Self-pay | Admitting: Internal Medicine

## 2011-03-22 ENCOUNTER — Ambulatory Visit (INDEPENDENT_AMBULATORY_CARE_PROVIDER_SITE_OTHER): Payer: Medicare Other | Admitting: Internal Medicine

## 2011-03-22 ENCOUNTER — Encounter (HOSPITAL_BASED_OUTPATIENT_CLINIC_OR_DEPARTMENT_OTHER): Payer: Self-pay | Admitting: *Deleted

## 2011-03-22 DIAGNOSIS — J069 Acute upper respiratory infection, unspecified: Secondary | ICD-10-CM

## 2011-03-22 DIAGNOSIS — R599 Enlarged lymph nodes, unspecified: Secondary | ICD-10-CM

## 2011-03-22 DIAGNOSIS — R590 Localized enlarged lymph nodes: Secondary | ICD-10-CM

## 2011-03-22 DIAGNOSIS — I10 Essential (primary) hypertension: Secondary | ICD-10-CM

## 2011-03-22 DIAGNOSIS — F411 Generalized anxiety disorder: Secondary | ICD-10-CM

## 2011-03-22 NOTE — Patient Instructions (Signed)
Tylenol 1-2 tabs po q6h prn  for pain or fever  Return office visit in 3 weeks for followup

## 2011-03-22 NOTE — Progress Notes (Signed)
  Subjective:    Patient ID: Jose [name], male    DOB: Jul 20, 1927, 77 y.o.   MRN: [id number]  HPI 77 year old patient who is seen today with a 3 week history of cold symptoms. He has had cough minor sore throat and his chief concern is swollen cervical length glands. These have been present for 2 or 3 weeks but seem to have increased over the past week. He is scheduled for a urologic procedure for renal stone disease later this week. He has been told not to take any anti-inflammatory medications. He states that he has lost 20 pounds over the past year. He is followed at [facility] and states he also has a history of prostate cancer. He does have treated hypertension and dyslipidemia    Review of Systems  Constitutional: Positive for unexpected weight change. Negative for fever, chills, appetite change and fatigue.  HENT: Positive for congestion, sore throat and rhinorrhea. Negative for hearing loss, ear pain, trouble swallowing, neck stiffness, dental problem, voice change and tinnitus.   Eyes: Negative for pain, discharge and visual disturbance.  Respiratory: Positive for cough. Negative for chest tightness, wheezing and stridor.   Cardiovascular: Negative for chest pain, palpitations and leg swelling.  Gastrointestinal: Negative for nausea, vomiting, abdominal pain, diarrhea, constipation, blood in stool and abdominal distention.  Genitourinary: Negative for urgency, hematuria, flank pain, discharge, difficulty urinating and genital sores.  Musculoskeletal: Negative for myalgias, back pain, joint swelling, arthralgias and gait problem.  Skin: Negative for rash.  Neurological: Negative for dizziness, syncope, speech difficulty, weakness, numbness and headaches.  Hematological: Negative for adenopathy. Does not bruise/bleed easily.  Psychiatric/Behavioral: Negative for behavioral problems and dysphoric mood. The patient is not nervous/anxious.        Objective:   Physical Exam    Constitutional: He is oriented to person, place, and time. He appears well-developed.  HENT:  Head: Normocephalic.  Right Ear: External ear normal.  Left Ear: External ear normal.  Eyes: Conjunctivae and EOM are normal.  Neck: Normal range of motion. No JVD present. No tracheal deviation present. No thyromegaly present.       Patient had bilateral symmetrical anterior cervical adenopathy 3-4 cm in diameter. Glands were firm and slightly tender  Cardiovascular: Normal rate and normal heart sounds.   Pulmonary/Chest: Breath sounds normal.  Abdominal: Bowel sounds are normal.  Musculoskeletal: Normal range of motion. He exhibits no edema and no tenderness.  Lymphadenopathy:    He has cervical adenopathy.  Neurological: He is alert and oriented to person, place, and time.  Psychiatric: He has a normal mood and affect. His behavior is normal.          Assessment & Plan:   Vital URI Symptomatic nephrolithiasis Cervical lymphadenopathy. Probably secondary to acute illness. We'll treat with Tylenol and observed. His last return in 3 weeks for reevaluation. He has no hepatosplenomegaly or supraclavicular axillary or anal nodes

## 2011-03-22 NOTE — Progress Notes (Signed)
NPO AFTER MN. ARRIVES AT 0800. NEEDS ISTAT. CURRENT EKG W/ CHART. WILL TAKE NORVASC AM OF SURG. W/ SIP OF WATER.

## 2011-03-25 NOTE — H&P (Addendum)
History of Present Illness               78 yo presented with abdominal pain Nov 2012. He was found on CT to have a 1 cm UPJ stone with hydronephrosis and 1 cm RMP stone. He really had no flank pain and was observed. He has no known history of kidney stones. Despite hydration his Cr went from 1.9 up to 2.7. He underwent right ureteral stent placement Jan 24, 2011 and did well. The stones were visible on flouro. He follows up today for management of his stones.   He also told me he had a history of prostate nodule followed at the [place]. He reports a prior prostate biopsy about a year ago. On exam under anesthesia he did have a focal nodule like a calcification at the right apex. He reports he is followed at the [facility] for this and has a f/u there Feb 2012.  [facility] and he has a documented history of prostate cancer diagnosed in 2006.  Patient underwent right ESWL Dec 2012 for right RLP stone (which had been in UPJ).  He is well with no dysuria or hematuria. He passed some fragment. He does complain of bilateral cervical LAD present for several weeks. He's had no fever, chills, cough, or congestion. He feels well.   KUB - right MP stone still present as expected. The RLP stone that was shocked still has a large fragment.   Past Medical History Problems  1. History of  Anxiety (Symptom) 300.00 2. History of  Arthritis V13.4 3. History of  Breast Cancer V10.3 4. History of  Depression 311 5. History of  Glaucoma 365.9 6. History of  Gout 274.9 7. History of  Hypercholesterolemia 272.0 8. History of  Hypertension 401.9 9. History of  Nephrolithiasis V13.01 10. History of  Skin Cancer V10.83  Surgical History Problems  1. History of  Breast Surgery Lumpectomy Right 2. History of  Cystoscopy With Insertion Of Ureteral Stent Right 3. History of  Leg Repair Left 4. History of  Lithotripsy  Current Meds 1. AmLODIPine Besylate 10 MG Oral Tablet; Therapy:  (Recorded:20Nov2012) to 2. Brimonidine Tartrate 0.2 % Ophthalmic Solution; Therapy: (Recorded:20Nov2012) to 3. Ciprofloxacin HCl 250 MG Oral Tablet; Therapy: (Recorded:20Nov2012) to 4. CloNIDine HCl 0.2 MG Oral Tablet; Therapy: (Recorded:20Nov2012) to 5. Gabapentin 300 MG Oral Capsule; Therapy: (Recorded:20Nov2012) to 6. LORazepam 0.5 MG Oral Tablet; Therapy: (Recorded:20Nov2012) to 7. Travatan Z 0.004 % Ophthalmic Solution; Therapy: (Recorded:20Nov2012) to  Allergies Medication  1. Codeine Derivatives  Family History Problems  1. Family history of  Acute Myocardial Infarction V17.3 2. Sororal history of  Alcoholism 3. Family history of  Death In The Family Father 4. Family history of  Death In The Family Mother 5. Family history of  Family Health Status Number Of Children 1 son 1 daughter 48. Family history of  Gastric Cancer V16.0  Social History Problems  1. Alcohol Use 1 per mo 2. Caffeine Use 5 per day 3. Former Smoker V15.82 smoked 6 cigars per day for 80yrs quit 63yrs ago 4. Marital History - Currently Married 5. Retired From Work  Review of [employer], constitutional, skin, eye, otolaryngeal, hematologic/lymphatic, cardiovascular, pulmonary, endocrine, musculoskeletal, gastrointestinal, neurological and psychiatric system(s) were reviewed and pertinent findings if present are noted.  Hematologic/Lymphatic: swollen glands.    Vitals Vital Signs [Data Includes: Last 1 Day]  24Dec2012 08:56AM  Blood Pressure: 155 / 81 Temperature: 96.9 F Heart Rate: 61  Physical Exam Constitutional: Well nourished and well developed . No acute distress.  Pulmonary: No respiratory distress and normal respiratory rhythm and effort.  Cardiovascular: Heart rate and rhythm are normal . No peripheral edema.  Abdomen: No CVA tenderness.  Neuro/Psych:. Mood and affect are appropriate.    Results/Data  Urine [Data Includes: Last 1 Day]   24Dec2012  COLOR YELLOW     APPEARANCE CLEAR   SPECIFIC GRAVITY 1.020   pH 5.5   GLUCOSE NEG mg/dL  BILIRUBIN NEG   KETONE NEG mg/dL  BLOOD LARGE   PROTEIN 30 mg/dL  UROBILINOGEN 0.2 mg/dL  NITRITE NEG   LEUKOCYTE ESTERASE SMALL   SQUAMOUS EPITHELIAL/HPF RARE   WBC 4-6 WBC/hpf  RBC TNTC RBC/hpf  BACTERIA FEW   CRYSTALS NONE SEEN   CASTS NONE SEEN    15 Mar 2011 8:40 AM   UA With REFLEX       COLOR YELLOW       APPEARANCE CLEAR       SPECIFIC GRAVITY 1.020       pH 5.5       GLUCOSE NEG       BILIRUBIN NEG       KETONE NEG       BLOOD LARGE       PROTEIN 30       UROBILINOGEN 0.2       NITRITE NEG       LEUKOCYTE ESTERASE SMALL       SQUAMOUS EPITHELIAL/HPF RARE       WBC 4-6       CRYSTALS NONE SEEN       CASTS NONE SEEN       RBC TNTC       BACTERIA FEW    Assessment Assessed  1. Nephrolithiasis 592.0 2. Prostate Cancer 185   Large RLP fragment and RMP stone - he has a relatively straight shot from RLP to the renal pelvis - real risk of obstruction is stent removed.   Plan  Nephrolithiasis (592.0)  1. KUB  Done: 24Dec2012 12:00AM 2. Follow-up Schedule Surgery Office  Follow-up  Done: 24Dec2012  UA With REFLEX  Status: Resulted - Requires Verification  Done: 01Jan0001 12:00AM Ordered Today; For: Health Maintenance (V70.0); Ordered By: Jerilee Field  Due: 26Dec2012 Marked Important; Last Updated By: Nathaniel Man   Discussion/Summary              Discussed prostate cancer with patient. He knows about this diagnosis and said he has a "little spot" and "more die with it than from it". He was told it could be "taken out" but did not want anything done. He said he only "answers questions" which is why he didn't tell me he had prostate cancer. I discussed the nature, R/B of prostate cancer WW/active surveillance vs. treatment (surgery or XRT). I offered to manage his PCa and check his PSA, but he wants to F/u at [facility]. He said he sees Dr. Cherie Ouch in Feb 2013. Now he says his PSA was < 5 two  years ago. We discussed the risks of surveillance such as prostate cancer progressing from curable to incurable among others. Again he wants to F/U at [facility].   Discussed his cervical LAD and importance of contacting his local PCP or Dr. Cherie Ouch about this.    Discussed KUB and nephrolithiasis. He may obstruct if stent removed. We discussed nature, R/B of stent removal today with stone surveillance or repeat ESWL. We discussed alternatives such as leaving  stent and repeat ESWL vs. cysto/URS/laser. He elects to leave stent for now and proceed with URS/laser lithotripsy. He would like to take care of these stones. I discussed with the patient the nature, benefits, risks, and alternatives to the procedure. We also discussed the likelihood of achieving the goals of the procedure and potential problems that might occur during the procedure or recuperation. All questions answered. We discussed he has a large stone burden and may still need multiple procedures.   H&P update - pt with cough and congestion this week which is improving. I spoke with Dr. Saunders Revel who is comfortable putting pt to sleep. Pt with minimal phlegm/congestion past few days. No other changes in H&P. Pt seen and examined today. U Cx negative.   Of note, pt saw his local PCP for first time in quite a while the other day and they discussed his prostate cancer. The patient would like for me to assume management of this issue. We will check a PSA in  Office and repeat EUA today.

## 2011-03-26 ENCOUNTER — Ambulatory Visit (HOSPITAL_BASED_OUTPATIENT_CLINIC_OR_DEPARTMENT_OTHER): Payer: Medicare Other | Admitting: Anesthesiology

## 2011-03-26 ENCOUNTER — Encounter (HOSPITAL_BASED_OUTPATIENT_CLINIC_OR_DEPARTMENT_OTHER): Payer: Self-pay | Admitting: Anesthesiology

## 2011-03-26 ENCOUNTER — Ambulatory Visit (HOSPITAL_BASED_OUTPATIENT_CLINIC_OR_DEPARTMENT_OTHER)
Admission: RE | Admit: 2011-03-26 | Discharge: 2011-03-26 | Disposition: A | Discharge status: Home / Self Care | Payer: Medicare Other | Source: Ambulatory Visit | Attending: Urology | Admitting: Urology

## 2011-03-26 ENCOUNTER — Encounter (HOSPITAL_BASED_OUTPATIENT_CLINIC_OR_DEPARTMENT_OTHER): Payer: Self-pay | Admitting: *Deleted

## 2011-03-26 ENCOUNTER — Encounter (HOSPITAL_BASED_OUTPATIENT_CLINIC_OR_DEPARTMENT_OTHER)
Admission: RE | Disposition: A | Discharge status: Home / Self Care | Payer: Self-pay | Source: Ambulatory Visit | Attending: Urology

## 2011-03-26 DIAGNOSIS — Z853 Personal history of malignant neoplasm of breast: Secondary | ICD-10-CM | POA: Insufficient documentation

## 2011-03-26 DIAGNOSIS — N200 Calculus of kidney: Secondary | ICD-10-CM | POA: Insufficient documentation

## 2011-03-26 DIAGNOSIS — Z79899 Other long term (current) drug therapy: Secondary | ICD-10-CM | POA: Insufficient documentation

## 2011-03-26 DIAGNOSIS — N201 Calculus of ureter: Secondary | ICD-10-CM | POA: Insufficient documentation

## 2011-03-26 DIAGNOSIS — I10 Essential (primary) hypertension: Secondary | ICD-10-CM | POA: Insufficient documentation

## 2011-03-26 DIAGNOSIS — C61 Malignant neoplasm of prostate: Secondary | ICD-10-CM | POA: Insufficient documentation

## 2011-03-26 DIAGNOSIS — H409 Unspecified glaucoma: Secondary | ICD-10-CM | POA: Insufficient documentation

## 2011-03-26 DIAGNOSIS — E7800 Pure hypercholesterolemia, unspecified: Secondary | ICD-10-CM | POA: Insufficient documentation

## 2011-03-26 DIAGNOSIS — M109 Gout, unspecified: Secondary | ICD-10-CM | POA: Insufficient documentation

## 2011-03-26 HISTORY — DX: Unspecified cataract: H26.9

## 2011-03-26 HISTORY — DX: Urgency of urination: R39.15

## 2011-03-26 HISTORY — DX: Anxiety disorder, unspecified: F41.9

## 2011-03-26 HISTORY — PX: EXAMINATION UNDER ANESTHESIA: SHX1540

## 2011-03-26 HISTORY — DX: Unspecified hearing loss, unspecified ear: H91.90

## 2011-03-26 HISTORY — DX: Hyperlipidemia, unspecified: E78.5

## 2011-03-26 HISTORY — DX: Diverticulosis of large intestine without perforation or abscess without bleeding: K57.30

## 2011-03-26 HISTORY — DX: Personal history of other diseases of urinary system: Z87.448

## 2011-03-26 HISTORY — DX: Malignant neoplasm of unspecified site of unspecified male breast: C50.929

## 2011-03-26 HISTORY — DX: Frequency of micturition: R35.0

## 2011-03-26 HISTORY — DX: Reserved for inherently not codable concepts without codable children: IMO0001

## 2011-03-26 HISTORY — DX: Trigeminal neuralgia: G50.0

## 2011-03-26 HISTORY — DX: Personal history of other diseases of the digestive system: Z87.19

## 2011-03-26 HISTORY — DX: Calculus of ureter: N20.1

## 2011-03-26 HISTORY — PX: URETEROSCOPY: SHX842

## 2011-03-26 SURGERY — URETEROSCOPY
Anesthesia: General | Site: Ureter | Laterality: Right | Wound class: Clean Contaminated

## 2011-03-26 MED ORDER — GLYCOPYRROLATE 0.2 MG/ML IJ SOLN
INTRAMUSCULAR | Status: DC | PRN
  Administered 2011-03-26: 0.2 mg via INTRAVENOUS

## 2011-03-26 MED ORDER — DEXAMETHASONE SODIUM PHOSPHATE 4 MG/ML IJ SOLN
INTRAMUSCULAR | Status: DC | PRN
  Administered 2011-03-26: 4 mg via INTRAVENOUS

## 2011-03-26 MED ORDER — LIDOCAINE HCL (CARDIAC) 20 MG/ML IV SOLN
INTRAVENOUS | Status: DC | PRN
  Administered 2011-03-26: 80 mg via INTRAVENOUS

## 2011-03-26 MED ORDER — TRAMADOL HCL 50 MG PO TABS
50.0000 mg | ORAL_TABLET | Freq: Four times a day (QID) | ORAL | Status: DC
  Administered 2011-03-26: 50 mg via ORAL

## 2011-03-26 MED ORDER — PROMETHAZINE HCL 25 MG/ML IJ SOLN: 6.2500 mg | INTRAMUSCULAR | Status: DC | PRN

## 2011-03-26 MED ORDER — ONDANSETRON HCL 4 MG/2ML IJ SOLN
INTRAMUSCULAR | Status: DC | PRN
  Administered 2011-03-26: 4 mg via INTRAVENOUS

## 2011-03-26 MED ORDER — LACTATED RINGERS IV SOLN
INTRAVENOUS | Status: DC
  Administered 2011-03-26 (×2): via INTRAVENOUS

## 2011-03-26 MED ORDER — FENTANYL CITRATE 0.05 MG/ML IJ SOLN
INTRAMUSCULAR | Status: DC | PRN
  Administered 2011-03-26: 50 ug via INTRAVENOUS
  Administered 2011-03-26 (×6): 25 ug via INTRAVENOUS

## 2011-03-26 MED ORDER — SODIUM CHLORIDE 0.9 % IR SOLN
Status: DC | PRN
  Administered 2011-03-26: 3000 mL

## 2011-03-26 MED ORDER — CIPROFLOXACIN HCL 500 MG PO TABS: 500.0000 mg | ORAL_TABLET | Freq: Every day | ORAL | Status: AC

## 2011-03-26 MED ORDER — FENTANYL CITRATE 0.05 MG/ML IJ SOLN
25.0000 ug | INTRAMUSCULAR | Status: DC | PRN
  Administered 2011-03-26 (×4): 25 ug via INTRAVENOUS

## 2011-03-26 MED ORDER — MEPERIDINE HCL 25 MG/ML IJ SOLN: 6.2500 mg | INTRAMUSCULAR | Status: DC | PRN

## 2011-03-26 MED ORDER — PROPOFOL 10 MG/ML IV EMUL
INTRAVENOUS | Status: DC | PRN
  Administered 2011-03-26: 180 mg via INTRAVENOUS

## 2011-03-26 MED ORDER — CEFAZOLIN SODIUM 1-5 GM-% IV SOLN
1.0000 g | INTRAVENOUS | Status: AC
  Administered 2011-03-26: 2 g via INTRAVENOUS

## 2011-03-26 MED ORDER — IOHEXOL 350 MG/ML SOLN
INTRAVENOUS | Status: DC | PRN
  Administered 2011-03-26: 50 mL

## 2011-03-26 MED ORDER — LACTATED RINGERS IV SOLN: INTRAVENOUS | Status: DC

## 2011-03-26 SURGICAL SUPPLY — 40 items
ADAPTER CATH URET PLST 4-6FR (CATHETERS) ×1 IMPLANT
ADPR CATH URET STRL DISP 4-6FR (CATHETERS) ×2
BAG DRAIN URO-CYSTO SKYTR STRL (DRAIN) ×3 IMPLANT
BAG DRN UROCATH (DRAIN) ×2
BASKET LASER NITINOL 1.9FR (BASKET) IMPLANT
BASKET STNLS GEMINI 4WIRE 3FR (BASKET) IMPLANT
BASKET ZERO TIP NITINOL 2.4FR (BASKET) ×1 IMPLANT
BRUSH URET BIOPSY 3F (UROLOGICAL SUPPLIES) IMPLANT
BSKT STON RTRVL 120 1.9FR (BASKET)
BSKT STON RTRVL GEM 120X11 3FR (BASKET)
BSKT STON RTRVL ZERO TP 2.4FR (BASKET) ×2
CANISTER SUCT LVC 12 LTR MEDI- (MISCELLANEOUS) ×1 IMPLANT
CATH FOLEY 2WAY SLVR  5CC 18FR (CATHETERS)
CATH FOLEY 2WAY SLVR 5CC 18FR (CATHETERS) IMPLANT
CATH INTERMIT  6FR 70CM (CATHETERS) ×1 IMPLANT
CATH URET 5FR 28IN CONE TIP (BALLOONS)
CATH URET 5FR 28IN OPEN ENDED (CATHETERS) IMPLANT
CATH URET 5FR 70CM CONE TIP (BALLOONS) IMPLANT
CLOTH BEACON ORANGE TIMEOUT ST (SAFETY) ×3 IMPLANT
DRAPE CAMERA CLOSED 9X96 (DRAPES) ×1 IMPLANT
ELECT REM PT RETURN 9FT ADLT (ELECTROSURGICAL)
ELECTRODE REM PT RTRN 9FT ADLT (ELECTROSURGICAL) IMPLANT
GLOVE BIO SURGEON STRL SZ7.5 (GLOVE) ×3 IMPLANT
GLOVE INDICATOR 6.5 STRL GRN (GLOVE) ×2 IMPLANT
GOWN PREVENTION PLUS LG XLONG (DISPOSABLE) ×3 IMPLANT
GOWN STRL REIN XL XLG (GOWN DISPOSABLE) ×3 IMPLANT
GUIDEWIRE 0.038 PTFE COATED (WIRE) IMPLANT
GUIDEWIRE ANG ZIPWIRE 038X150 (WIRE) IMPLANT
GUIDEWIRE STR DUAL SENSOR (WIRE) ×4 IMPLANT
IV NS IRRIG 3000ML ARTHROMATIC (IV SOLUTION) ×6 IMPLANT
KIT BALLIN UROMAX 15FX10 (LABEL) IMPLANT
KIT BALLN UROMAX 15FX4 (MISCELLANEOUS) IMPLANT
KIT BALLN UROMAX 26 75X4 (MISCELLANEOUS)
LASER FIBER DISP (UROLOGICAL SUPPLIES) ×1 IMPLANT
PACK CYSTOSCOPY (CUSTOM PROCEDURE TRAY) ×3 IMPLANT
SET HIGH PRES BAL DIL (LABEL)
SHEATH ACCESS URETERAL 38CM (SHEATH) ×1 IMPLANT
SHEATH URET ACCESS 12FR/35CM (UROLOGICAL SUPPLIES) IMPLANT
SHEATH URET ACCESS 12FR/55CM (UROLOGICAL SUPPLIES) ×1 IMPLANT
SYRINGE IRR TOOMEY STRL 70CC (SYRINGE) IMPLANT

## 2011-03-26 NOTE — Anesthesia Procedure Notes (Signed)
Procedure Name: LMA Insertion Date/Time: 03/26/2011 9:35 AM Performed by: Renella Cunas D Pre-anesthesia Checklist: Patient identified, Emergency Drugs available, Suction available and Patient being monitored Patient Re-evaluated:Patient Re-evaluated prior to inductionOxygen Delivery Method: Circle System Utilized Preoxygenation: Pre-oxygenation with 100% oxygen Intubation Type: IV induction Ventilation: Mask ventilation without difficulty LMA: LMA inserted LMA Size: 4.0 Number of attempts: 1 Placement Confirmation: positive ETCO2 Tube secured with: Tape Dental Injury: Teeth and Oropharynx as per pre-operative assessment

## 2011-03-26 NOTE — Anesthesia Postprocedure Evaluation (Signed)
  Anesthesia Post-op Note  Patient: Jose [name]  Procedure(s) Performed:  URETEROSCOPY - RIGHT URETEROSCOPY, HOLMIUM LASER AND STENT C-ARM HOLMIUM LASER; HOLMIUM LASER APPLICATION; EXAM UNDER ANESTHESIA  Patient Location: PACU  Anesthesia Type: General  Level of Consciousness: awake and alert   Airway and Oxygen Therapy: Patient Spontanous Breathing  Post-op Pain: mild  Post-op Assessment: Post-op Vital signs reviewed, Patient's Cardiovascular Status Stable, Respiratory Function Stable, Patent Airway and No signs of Nausea or vomiting  Post-op Vital Signs: stable  Complications: No apparent anesthesia complications

## 2011-03-26 NOTE — Transfer of Care (Signed)
Immediate Anesthesia Transfer of Care Note  Patient: Jose [name]  Procedure(s) Performed:  URETEROSCOPY - RIGHT URETEROSCOPY, HOLMIUM LASER AND STENT C-ARM HOLMIUM LASER; HOLMIUM LASER APPLICATION; EXAM UNDER ANESTHESIA  Patient Location: PACU  Anesthesia Type: General  Level of Consciousness: awake, oriented, sedated and patient cooperative  Airway & Oxygen Therapy: Patient Spontanous Breathing and Patient connected to face mask oxygen  Post-op Assessment: Report given to PACU RN and Post -op Vital signs reviewed and stable  Post vital signs: Reviewed and stable  Complications: No apparent anesthesia complications

## 2011-03-26 NOTE — Anesthesia Preprocedure Evaluation (Addendum)
Anesthesia Evaluation  Patient identified by MRN, date of birth, ID band Patient awake    Reviewed: Allergy & Precautions, H&P , NPO status , Patient's Chart, lab work & pertinent test results  Airway Mallampati: II TM Distance: >3 FB Neck ROM: full    Dental No notable dental hx. (+) Missing and Dental Advisory Given   Pulmonary neg pulmonary ROS,  clear to auscultation  Pulmonary exam normal       Cardiovascular Exercise Tolerance: Good hypertension, Pt. on medications neg cardio ROS regular Normal    Neuro/Psych PSYCHIATRIC DISORDERS Anxiety Negative Neurological ROS  Negative Psych ROS   GI/Hepatic negative GI ROS, Neg liver ROS, GERD-  Controlled,  Endo/Other  Negative Endocrine ROS  Renal/GU negative Renal ROS  Genitourinary negative   Musculoskeletal   Abdominal   Peds  Hematology negative hematology ROS (+)   Anesthesia Other Findings   Reproductive/Obstetrics negative OB ROS                          Anesthesia Physical Anesthesia Plan  ASA: II  Anesthesia Plan: General   Post-op Pain Management:    Induction:   Airway Management Planned: LMA  Additional Equipment:   Intra-op Plan:   Post-operative Plan:   Informed Consent: I have reviewed the patients History and Physical, chart, labs and discussed the procedure including the risks, benefits and alternatives for the proposed anesthesia with the patient or authorized representative who has indicated his/her understanding and acceptance.   Dental Advisory Given  Plan Discussed with: CRNA  Anesthesia Plan Comments:        Anesthesia Quick Evaluation

## 2011-03-26 NOTE — Brief Op Note (Signed)
03/26/2011  11:14 AM  PATIENT:  Jose [name]  78 y.o. male  PRE-OPERATIVE DIAGNOSIS: 1)  NEPHROLITHIASIS, 2) prostate cancer  POST-OPERATIVE DIAGNOSIS: 1) NEPHROLITHIASIS, 2) Ureteral stone 3) prostate cancer  PROCEDURE:  Procedure(s): Exam under Anesthesia RIGHT URETEROSCOPY, HOLMIUM LASER APPLICATION, stone extraction , stent placement  SURGEON:  Surgeon(s): Antony Haste, MD  PHYSICIAN ASSISTANT:   ASSISTANTS: none   ANESTHESIA:   general  FINDINGS - EUA - right prostate nodule, normal penis and testes. Flouro/cysto/URS - RLP stone fragmented well from ESWL - several fragments pulled from proximal ureter. RUP 1 cm stone treated with laser lithotripsy and majority removed. Several small fragments left felt to be clinically insignificant (not seen on flouro).  EBL:  Minimal  BLOOD ADMINISTERED:none  DRAINS: right ureteral stent with string  LOCAL MEDICATIONS USED:  NONE  SPECIMEN:  Stone fragements  DISPOSITION OF SPECIMEN:  Path   COUNTS:  YES  TOURNIQUET:  * No tourniquets in log *  DICTATION: 161096  PLAN OF CARE: Discharge to home after PACU  PATIENT DISPOSITION:  PACU - hemodynamically stable.   Delay start of Pharmacological VTE agent (>24hrs) due to surgical blood loss or risk of bleeding:  {YES/NO/NOT APPLICABLE:20182

## 2011-03-27 NOTE — Op Note (Signed)
Jose [name], SLIGER NO.:  [id number]  MEDICAL RECORD NO.:  [id number]  LOCATION:                                 FACILITY:  PHYSICIAN:  Jerilee Field, MD   DATE OF BIRTH:  12-01-1927  DATE OF PROCEDURE: DATE OF DISCHARGE:                              OPERATIVE REPORT   PREOPERATIVE DIAGNOSES: 1. Nephrolithiasis. 2. Prostate cancer.  POSTOPERATIVE DIAGNOSES: 1. Nephrolithiasis. 2. Ureteral stone. 3. Prostate cancer.  PROCEDURE:  Exam under anesthesia, cystoscopy, right ureteroscopy, holmium laser lithotripsy, stone basket extraction, and right ureteral stent placement/exchange.  SURGEON:  Jerilee Field, M.D.  ANESTHESIA:  General.  FINDINGS:  On exam under anesthesia, the patient had a large right prostate nodule worrisome for extracapsular extension.  He has known prostate cancer.  He had a normal penis and testicles.  On cystoscopy with fluoro, a right lower pole stone from prior shockwave was well fragmented.  Several fragments were found in the proximal ureter, which were removed.  The other large stone within the right upper pole and posterior calyx and it was fragmented and all fragments removed except for some clinically insignificant fragments that were not seen on fluoro.  ESTIMATED BLOOD LOSS:  Minimal.  DESCRIPTION OF PROCEDURE:  After consent was obtained, the patient was brought to the operating room, time-out was performed to confirm the patient and procedure.  After adequate anesthesia, SCDs, and preop antibiotics, he was placed in lithotomy position.  Exam under anesthesia revealed previous findings.  Again, rectal exam, the prostate on the right had a rather large nodule in asymmetric enlargement.  Cystoscope was passed per urethra after the patient was prepped and draped in the usual fashion.  The right stent was grasped and removed per urethra.  A wire was advanced and coiled in the right upper pole.  The ureteral access  sheath was then passed over the wire after the patient's bladder was drained.  The inner cannula was removed and a second wire was passed into the kidney.  The ureteral access sheath was removed and then passed over a single wire.  The inner cannula was again removed as well as the 2nd wire leaving only a safety wire outside of the access sheath.  On exam of the ureter, there were stone fragments in the proximal ureter and these were removed with a 0-tip basket, and collected.  There were three to four 1-2 mm fragments in the proximal ureter.  The right lower pole was free of stones and there were various stone debris that were all small and clinically insignificant and not seen on fluoro.  The remaining large stone was found in the right upper pole and posterior calyx.  It was lasered down the small pieces at a setting initially of 0.5 and 20, and then 0.5 and 10.  Most of the pieces were removed apart from several small pieces that could not be grasped with the basket. These were not seen on fluoro felt to be clinically insignificant. After removing all significant stone burden, the ureteral access sheath was carefully backed out on the ureteroscope.  The ureteroscope was then used to inspect the entire ureter.  There was  some edema from the prior stent placement, but there were no ureteral stones and no ureteral trauma.  The scope was removed.  The safety wire was then backloaded on the cystoscope and a 6 x 26 cm stent was placed.  The wire was removed and a good coil was seen in the kidney and in the bladder.  The patient's bladder was drained and the scope was removed.  The patient was cleaned up and the string was affixed to the patient.  Of note, he did have a wide caliber urethral stricture in the bulb urethra at the scope easily traversed.  DRAINS:  Right ureteral stent with strings.  SPECIMEN:  Stone fragments.  COMPLICATIONS:  None.  DISPOSITION:  The patient stable to  PACU.          ______________________________ Jerilee Field, MD     ME/MEDQ  D:  03/26/2011  T:  03/27/2011  Job:  [id number]

## 2011-03-29 ENCOUNTER — Encounter (HOSPITAL_BASED_OUTPATIENT_CLINIC_OR_DEPARTMENT_OTHER): Payer: Self-pay | Admitting: Urology

## 2011-04-12 ENCOUNTER — Ambulatory Visit: Payer: Medicare Other | Admitting: Internal Medicine

## 2011-04-13 ENCOUNTER — Other Ambulatory Visit (HOSPITAL_COMMUNITY)
Admission: RE | Admit: 2011-04-13 | Discharge: 2011-04-13 | Disposition: A | Discharge status: Home / Self Care | Payer: Medicare Other | Source: Ambulatory Visit | Attending: Otolaryngology | Admitting: Otolaryngology

## 2011-04-13 ENCOUNTER — Other Ambulatory Visit: Payer: Self-pay | Admitting: Otolaryngology

## 2011-04-13 DIAGNOSIS — R6889 Other general symptoms and signs: Secondary | ICD-10-CM | POA: Insufficient documentation

## 2011-04-13 DIAGNOSIS — R220 Localized swelling, mass and lump, head: Secondary | ICD-10-CM | POA: Insufficient documentation

## 2011-04-15 ENCOUNTER — Encounter (HOSPITAL_COMMUNITY): Payer: Self-pay | Admitting: Respiratory Therapy

## 2011-04-19 NOTE — H&P (Signed)
Jose [name] is an 78 y.o. male.   Chief Complaint: Cervical lymphadenopathy HPI: 2 month history of slowly enlarging bilateral neck nodes. FNA consistent with lymphoid process.   Past Medical History  Diagnosis Date  . Hypertension   . Blood transfusion     2006 had 4 units  . Arthritis     knees,back,shoulders  . Anxiety   . Male breast cancer 1988    S/P RIGHT MASTECTOMY W/ NODE DISSECTION AND CHEMO  . Right ureteral stone S/P URETERAL STENT 01-24-11  AND ESWL  03-01-11  . History of acute renal failure 01-23-11    DUE TO HYDRONEPHROSIS AND RIGHT STONE  . Normal cardiac stress test 09-25-2007  . Glaucoma   . Cataract immature   . Diverticulosis of colon   . History of GI diverticular bleed   . Hyperlipemia   . Impaired hearing NOT WEARING HIS BILATERL AIDS  . Trigeminal neuralgia RIGHT V3     S/P DECOMPRESSION  5TH CRANIAL NERVE 2003  . Head cold     COUGH , NO FEVER, SWELLING BILATERAL NODES (NECK)  . Urgency of urination     DUE TO URETERAL STONE AND STENT  . Frequency of urination     Past Surgical History  Procedure Date  . Cystoscopy w/ ureteral stent placement 01/24/2011    Procedure: CYSTOSCOPY WITH RETROGRADE PYELOGRAM/URETERAL STENT PLACEMENT;  Surgeon: Antony Haste, MD;  Location: [facility];  Service: Urology;  Laterality: Right;  . Right leg surg for fx AGE 17  . Craniectomy suboccipital for exploration / decompression cranial nerves 2003    5TH CRANIAL NERVE DECOMPRESSION  . Simple mastectomy 1988    MALE--- RIGHT BREAST W/ NODE DISSECTION  . Melanoma excision 10 YRS AGO    BACK AREA  . Extracorporeal shock wave lithotripsy 03-01-11    RIGHT  . Ureteroscopy 03/26/2011    Procedure: URETEROSCOPY;  Surgeon: Antony Haste, MD;  Location: [facility];  Service: Urology;  Laterality: Right;  RIGHT URETEROSCOPY, HOLMIUM LASER AND STENT C-ARM HOLMIUM LASER  . Examination under anesthesia 03/26/2011    Procedure: EXAM UNDER  ANESTHESIA;  Surgeon: Antony Haste, MD;  Location: [facility];  Service: Urology;  Laterality: N/A;    No family history on file. Social History:  reports that he quit smoking about 30 years ago. He quit smokeless tobacco use about 30 years ago. He reports that he drinks alcohol. He reports that he does not use illicit drugs.  Allergies:  Allergies  Allergen Reactions  . Hydromorphone Other (See Comments)    phlebitis  . Morphine And Related Other (See Comments)    phlebitis  . Codeine Other (See Comments)    Dizzy    No current facility-administered medications on file as of .   Medications Prior to Admission  Medication Sig Dispense Refill  . amLODipine (NORVASC) 10 MG tablet Take 10 mg by mouth every morning.       . brimonidine (ALPHAGAN) 0.2 % ophthalmic solution Place 1 drop into both eyes 2 (two) times daily.  5 mL    . gabapentin (NEURONTIN) 300 MG capsule Take 600 mg by mouth 2 (two) times daily before lunch and supper.       Marland Kitchen LORazepam (ATIVAN) 0.5 MG tablet Take 0.5 mg by mouth 2 (two) times daily as needed. ANXIETY       . Multiple Vitamins-Minerals (MULTIVITAMIN WITH MINERALS) tablet Take 1 tablet by mouth daily.       Marland Kitchen  oxyCODONE (OXYCONTIN) 20 MG 12 hr tablet Take 20 mg by mouth every 12 (twelve) hours as needed. For pain      . oxymetazoline (AFRIN) 0.05 % nasal spray Place 1 spray into the nose 2 (two) times daily as needed. ALLERGIES       . traMADol (ULTRAM) 50 MG tablet Take 50 mg by mouth daily as needed. Maximum dose= 8 tablets per day For pain      . Travoprost, BAK Free, (TRAVATAMN) 0.004 % SOLN ophthalmic solution Place 1 drop into both eyes at bedtime.      . vitamin C (ASCORBIC ACID) 500 MG tablet Take 1,000 mg by mouth daily.       . vitamin E 400 UNIT capsule Take 400 Units by mouth daily.         No results found for this or any previous visit (from the past 48 hour(s)). No results found.  ROS: otherwise  negative  There were no vitals taken for this visit.  PHYSICAL EXAM: Overall appearance:  Healthy appearing, in no distress Head:  Normocephalic, atraumatic. Ears: External auditory canals are clear; tympanic membranes are intact and the middle ears are free of any effusion. Nose: External nose is healthy in appearance. Internal nasal exam free of any lesions or obstruction. Oral Cavity:  There are no mucosal lesions or masses identified. Oral Pharynx/Hypopharynx/Larynx: no signs of any mucosal lesions or masses identified. Neuro:  No identifiable neurologic deficits. Neck: Bilateral level 2 nodes enlarged - about 5-6 cm..  Studies Reviewed: none    Assessment/Plan Cervical lymphadenopathy, suspicious for lymphoma. Recommend excisional biopsy.  Kallen Delatorre H 04/19/2011, 8:16 PM

## 2011-04-20 ENCOUNTER — Encounter (HOSPITAL_COMMUNITY): Payer: Self-pay

## 2011-04-20 MED ORDER — CEFAZOLIN SODIUM-DEXTROSE 2-3 GM-% IV SOLR
2.0000 g | INTRAVENOUS | Status: AC
Start: 2011-04-20 — End: 2011-04-21
  Administered 2011-04-21: 2 g via INTRAVENOUS
  Filled 2011-04-20: qty 50

## 2011-04-21 ENCOUNTER — Other Ambulatory Visit: Payer: Self-pay | Admitting: Otolaryngology

## 2011-04-21 ENCOUNTER — Encounter (HOSPITAL_COMMUNITY): Payer: Self-pay | Admitting: Surgery

## 2011-04-21 ENCOUNTER — Ambulatory Visit (HOSPITAL_COMMUNITY)
Admission: RE | Admit: 2011-04-21 | Discharge: 2011-04-21 | Disposition: A | Discharge status: Home / Self Care | Payer: Medicare Other | Source: Ambulatory Visit | Attending: Otolaryngology | Admitting: Otolaryngology

## 2011-04-21 ENCOUNTER — Encounter (HOSPITAL_COMMUNITY): Payer: Self-pay | Admitting: Anesthesiology

## 2011-04-21 ENCOUNTER — Ambulatory Visit (HOSPITAL_COMMUNITY): Payer: Medicare Other | Admitting: Anesthesiology

## 2011-04-21 ENCOUNTER — Other Ambulatory Visit: Payer: Self-pay

## 2011-04-21 ENCOUNTER — Ambulatory Visit (HOSPITAL_COMMUNITY): Payer: Medicare Other

## 2011-04-21 ENCOUNTER — Encounter (HOSPITAL_COMMUNITY)
Admission: RE | Disposition: A | Discharge status: Home / Self Care | Payer: Self-pay | Source: Ambulatory Visit | Attending: Otolaryngology

## 2011-04-21 DIAGNOSIS — C01 Malignant neoplasm of base of tongue: Secondary | ICD-10-CM

## 2011-04-21 DIAGNOSIS — C770 Secondary and unspecified malignant neoplasm of lymph nodes of head, face and neck: Secondary | ICD-10-CM | POA: Insufficient documentation

## 2011-04-21 DIAGNOSIS — C109 Malignant neoplasm of oropharynx, unspecified: Secondary | ICD-10-CM

## 2011-04-21 DIAGNOSIS — M19019 Primary osteoarthritis, unspecified shoulder: Secondary | ICD-10-CM | POA: Insufficient documentation

## 2011-04-21 DIAGNOSIS — K219 Gastro-esophageal reflux disease without esophagitis: Secondary | ICD-10-CM | POA: Insufficient documentation

## 2011-04-21 DIAGNOSIS — K5730 Diverticulosis of large intestine without perforation or abscess without bleeding: Secondary | ICD-10-CM | POA: Insufficient documentation

## 2011-04-21 DIAGNOSIS — H9190 Unspecified hearing loss, unspecified ear: Secondary | ICD-10-CM | POA: Insufficient documentation

## 2011-04-21 DIAGNOSIS — Z85810 Personal history of malignant neoplasm of tongue: Secondary | ICD-10-CM | POA: Insufficient documentation

## 2011-04-21 DIAGNOSIS — Z853 Personal history of malignant neoplasm of breast: Secondary | ICD-10-CM | POA: Insufficient documentation

## 2011-04-21 DIAGNOSIS — H409 Unspecified glaucoma: Secondary | ICD-10-CM | POA: Insufficient documentation

## 2011-04-21 DIAGNOSIS — Z79899 Other long term (current) drug therapy: Secondary | ICD-10-CM | POA: Insufficient documentation

## 2011-04-21 DIAGNOSIS — I10 Essential (primary) hypertension: Secondary | ICD-10-CM | POA: Insufficient documentation

## 2011-04-21 DIAGNOSIS — E785 Hyperlipidemia, unspecified: Secondary | ICD-10-CM | POA: Insufficient documentation

## 2011-04-21 HISTORY — DX: Gastro-esophageal reflux disease without esophagitis: K21.9

## 2011-04-21 HISTORY — DX: Chronic kidney disease, unspecified: N18.9

## 2011-04-21 HISTORY — DX: Anemia, unspecified: D64.9

## 2011-04-21 HISTORY — DX: Unspecified abdominal hernia without obstruction or gangrene: K46.9

## 2011-04-21 HISTORY — PX: LYMPH NODE BIOPSY: SHX201

## 2011-04-21 HISTORY — DX: Insomnia, unspecified: G47.00

## 2011-04-21 HISTORY — DX: Headache: R51

## 2011-04-21 HISTORY — DX: Malignant neoplasm of base of tongue: C01

## 2011-04-21 LAB — BASIC METABOLIC PANEL
CO2: 27 mEq/L (ref 19–32)
Chloride: 106 mEq/L (ref 96–112)
Glucose, Bld: 117 mg/dL — ABNORMAL HIGH (ref 70–99)
Sodium: 143 mEq/L (ref 135–145)

## 2011-04-21 LAB — CBC
HCT: 39.4 % (ref 39.0–52.0)
MCH: 29.2 pg (ref 26.0–34.0)
MCV: 86.4 fL (ref 78.0–100.0)
RBC: 4.56 MIL/uL (ref 4.22–5.81)
WBC: 8.4 10*3/uL (ref 4.0–10.5)

## 2011-04-21 SURGERY — LYMPH NODE BIOPSY
Anesthesia: General | Site: Neck | Laterality: Right | Wound class: Clean

## 2011-04-21 MED ORDER — PROPOFOL 10 MG/ML IV EMUL
INTRAVENOUS | Status: DC | PRN
  Administered 2011-04-21: 200 mg via INTRAVENOUS
  Administered 2011-04-21: 180 mg via INTRAVENOUS

## 2011-04-21 MED ORDER — ONDANSETRON HCL 4 MG/2ML IJ SOLN
INTRAMUSCULAR | Status: DC | PRN
  Administered 2011-04-21: 4 mg via INTRAVENOUS

## 2011-04-21 MED ORDER — 0.9 % SODIUM CHLORIDE (POUR BTL) OPTIME
TOPICAL | Status: DC | PRN
  Administered 2011-04-21: 1000 mL

## 2011-04-21 MED ORDER — PROMETHAZINE HCL 25 MG/ML IJ SOLN: 6.2500 mg | INTRAMUSCULAR | Status: DC | PRN

## 2011-04-21 MED ORDER — HYDROCODONE-ACETAMINOPHEN 7.5-500 MG PO TABS: 1.0000 | ORAL_TABLET | Freq: Four times a day (QID) | ORAL | Status: AC | PRN

## 2011-04-21 MED ORDER — CEPHALEXIN 500 MG PO CAPS: 500.0000 mg | ORAL_CAPSULE | Freq: Three times a day (TID) | ORAL | Status: DC

## 2011-04-21 MED ORDER — MEPERIDINE HCL 25 MG/ML IJ SOLN: 6.2500 mg | INTRAMUSCULAR | Status: DC | PRN

## 2011-04-21 MED ORDER — PROMETHAZINE HCL 25 MG RE SUPP: 25.0000 mg | Freq: Four times a day (QID) | RECTAL | Status: AC | PRN

## 2011-04-21 MED ORDER — LACTATED RINGERS IV SOLN
INTRAVENOUS | Status: DC | PRN
  Administered 2011-04-21 (×2): via INTRAVENOUS

## 2011-04-21 MED ORDER — FENTANYL CITRATE 0.05 MG/ML IJ SOLN
INTRAMUSCULAR | Status: DC | PRN
  Administered 2011-04-21: 75 ug via INTRAVENOUS
  Administered 2011-04-21 (×2): 25 ug via INTRAVENOUS

## 2011-04-21 MED ORDER — LACTATED RINGERS IV SOLN
INTRAVENOUS | Status: DC
  Administered 2011-04-21: 10:00:00 via INTRAVENOUS

## 2011-04-21 MED ORDER — MUPIROCIN 2 % EX OINT
TOPICAL_OINTMENT | Freq: Two times a day (BID) | CUTANEOUS | Status: DC
  Administered 2011-04-21: 1 via NASAL

## 2011-04-21 MED ORDER — FENTANYL CITRATE 0.05 MG/ML IJ SOLN: 25.0000 ug | INTRAMUSCULAR | Status: DC | PRN

## 2011-04-21 MED ORDER — MUPIROCIN 2 % EX OINT
TOPICAL_OINTMENT | CUTANEOUS | Status: AC
  Filled 2011-04-21: qty 22

## 2011-04-21 SURGICAL SUPPLY — 52 items
ADH SKN CLS LQ APL DERMABOND (GAUZE/BANDAGES/DRESSINGS) ×4
APPLIER CLIP 9.375 SM OPEN (CLIP)
APR CLP SM 9.3 20 MLT OPN (CLIP)
CANISTER SUCTION 2500CC (MISCELLANEOUS) ×3 IMPLANT
CLEANER TIP ELECTROSURG 2X2 (MISCELLANEOUS) ×3 IMPLANT
CLIP APPLIE 9.375 SM OPEN (CLIP) IMPLANT
CLOTH BEACON ORANGE TIMEOUT ST (SAFETY) ×3 IMPLANT
CONNECTOR UNIV Y (MISCELLANEOUS) IMPLANT
CONT SPEC 4OZ CLIKSEAL STRL BL (MISCELLANEOUS) ×3 IMPLANT
CORDS BIPOLAR (ELECTRODE) ×2 IMPLANT
COVER SURGICAL LIGHT HANDLE (MISCELLANEOUS) ×3 IMPLANT
DERMABOND ADHESIVE PROPEN (GAUZE/BANDAGES/DRESSINGS) ×2
DERMABOND ADVANCED .7 DNX6 (GAUZE/BANDAGES/DRESSINGS) IMPLANT
DRAIN CHANNEL 15F RND FF W/TCR (WOUND CARE) IMPLANT
DRAPE INCISE 23X17 IOBAN STRL (DRAPES)
DRAPE INCISE 23X17 STRL (DRAPES) IMPLANT
DRAPE INCISE IOBAN 23X17 STRL (DRAPES) IMPLANT
ELECT COATED BLADE 2.86 ST (ELECTRODE) ×3 IMPLANT
ELECT REM PT RETURN 9FT ADLT (ELECTROSURGICAL) ×3
ELECTRODE REM PT RTRN 9FT ADLT (ELECTROSURGICAL) ×2 IMPLANT
EVACUATOR SILICONE 100CC (DRAIN) ×2 IMPLANT
GAUZE SPONGE 4X4 16PLY XRAY LF (GAUZE/BANDAGES/DRESSINGS) ×3 IMPLANT
GLOVE ECLIPSE 6.5 STRL STRAW (GLOVE) ×2 IMPLANT
GLOVE ECLIPSE 7.5 STRL STRAW (GLOVE) ×3 IMPLANT
GOWN STRL NON-REIN LRG LVL3 (GOWN DISPOSABLE) ×6 IMPLANT
KIT BASIN OR (CUSTOM PROCEDURE TRAY) ×3 IMPLANT
KIT ROOM TURNOVER OR (KITS) ×3 IMPLANT
LOCATOR NERVE 3 VOLT (DISPOSABLE) IMPLANT
NDL HYPO 25GX1X1/2 BEV (NEEDLE) ×2 IMPLANT
NEEDLE HYPO 25GX1X1/2 BEV (NEEDLE) ×3 IMPLANT
NS IRRIG 1000ML POUR BTL (IV SOLUTION) ×3 IMPLANT
PACK SURGICAL SETUP 50X90 (CUSTOM PROCEDURE TRAY) ×1 IMPLANT
PAD ARMBOARD 7.5X6 YLW CONV (MISCELLANEOUS) ×6 IMPLANT
PENCIL FOOT CONTROL (ELECTRODE) ×3 IMPLANT
SPECIMEN JAR MEDIUM (MISCELLANEOUS) IMPLANT
SPONGE INTESTINAL PEANUT (DISPOSABLE) IMPLANT
SPONGE LAP 18X18 X RAY DECT (DISPOSABLE) IMPLANT
STAPLER VISISTAT 35W (STAPLE) ×2 IMPLANT
SUT CHROMIC 3 0 SH 27 (SUTURE) ×1 IMPLANT
SUT CHROMIC 5 0 P 3 (SUTURE) IMPLANT
SUT ETHILON 3 0 PS 1 (SUTURE) IMPLANT
SUT ETHILON 5 0 PS 2 18 (SUTURE) IMPLANT
SUT SILK 2 0 REEL (SUTURE) IMPLANT
SUT SILK 3 0 SH CR/8 (SUTURE) IMPLANT
SUT SILK 4 0 REEL (SUTURE) IMPLANT
SUT VIC AB 3-0 SH 18 (SUTURE) IMPLANT
TOWEL OR 17X24 6PK STRL BLUE (TOWEL DISPOSABLE) ×3 IMPLANT
TOWEL OR 17X26 10 PK STRL BLUE (TOWEL DISPOSABLE) ×3 IMPLANT
TRAY ENT MC OR (CUSTOM PROCEDURE TRAY) ×3 IMPLANT
TRAY FOLEY CATH 14FRSI W/METER (CATHETERS) IMPLANT
TUBE FEEDING 10FR FLEXIFLO (MISCELLANEOUS) IMPLANT
WATER STERILE IRR 1000ML POUR (IV SOLUTION) ×3 IMPLANT

## 2011-04-21 NOTE — Anesthesia Preprocedure Evaluation (Signed)
Anesthesia Evaluation  Patient identified by MRN, date of birth, ID band Patient awake    Reviewed: Allergy & Precautions, H&P , NPO status , Patient's Chart, lab work & pertinent test results  Airway Mallampati: II TM Distance: >3 FB Neck ROM: Full    Dental No notable dental hx. (+) Partial Lower and Poor Dentition   Pulmonary neg pulmonary ROS,  clear to auscultation  Pulmonary exam normal       Cardiovascular hypertension, On Medications Regular Normal    Neuro/Psych  Headaches, Negative Psych ROS   GI/Hepatic Neg liver ROS, GERD-  Controlled,  Endo/Other  Negative Endocrine ROS  Renal/GU negative Renal ROS  Genitourinary negative   Musculoskeletal   Abdominal   Peds  Hematology negative hematology ROS (+)   Anesthesia Other Findings   Reproductive/Obstetrics negative OB ROS                           Anesthesia Physical Anesthesia Plan  ASA: II  Anesthesia Plan: General   Post-op Pain Management:    Induction: Intravenous  Airway Management Planned: LMA  Additional Equipment:   Intra-op Plan:   Post-operative Plan: Extubation in OR  Informed Consent: I have reviewed the patients History and Physical, chart, labs and discussed the procedure including the risks, benefits and alternatives for the proposed anesthesia with the patient or authorized representative who has indicated his/her understanding and acceptance.     Plan Discussed with: CRNA  Anesthesia Plan Comments:         Anesthesia Quick Evaluation

## 2011-04-21 NOTE — Transfer of Care (Signed)
Immediate Anesthesia Transfer of Care Note  Patient: Jose [name]  Procedure(s) Performed:  LYMPH NODE BIOPSY; LARYNGOSCOPY AND ESOPHAGOSCOPY - larynogoscopy and esophagoscopy with biopsy from base of tongue  Patient Location: PACU  Anesthesia Type: General  Level of Consciousness: awake and alert   Airway & Oxygen Therapy: Patient Spontanous Breathing and Patient connected to face mask oxygen  Post-op Assessment: Report given to PACU RN  Post vital signs: Reviewed and stable  Complications: No apparent anesthesia complications

## 2011-04-21 NOTE — Progress Notes (Signed)
Report given to maria rn as caregiver 

## 2011-04-21 NOTE — Anesthesia Postprocedure Evaluation (Signed)
  Anesthesia Post-op Note  Patient: Jose [name]  Procedure(s) Performed:  LYMPH NODE BIOPSY; LARYNGOSCOPY AND ESOPHAGOSCOPY - larynogoscopy and esophagoscopy with biopsy from base of tongue  Patient Location: PACU  Anesthesia Type: General  Level of Consciousness: awake  Airway and Oxygen Therapy: Patient Spontanous Breathing  Post-op Pain: mild  Post-op Assessment: Post-op Vital signs reviewed, Patient's Cardiovascular Status Stable, Respiratory Function Stable and Patent Airway  Post-op Vital Signs: Reviewed and stable  Complications: No apparent anesthesia complications

## 2011-04-21 NOTE — Op Note (Addendum)
OPERATIVE REPORT  DATE OF SURGERY: 04/21/2011  PATIENT:  Jose [name],  79 y.o. male  PRE-OPERATIVE DIAGNOSIS:  Cervical LYMPHADENOPATHY   POST-OPERATIVE DIAGNOSIS:  squamous cell carcinoma  PROCEDURE:  Procedure(s): LYMPH NODE BIOPSY LARYNGOSCOPY AND ESOPHAGOSCOPY  SURGEON:  Susy Frizzle, MD  ASSISTANTS: none   ANESTHESIA:   general  EBL:  30 ml  DRAINS: none   LOCAL MEDICATIONS USED:  NONE  SPECIMEN:  Source of Specimen:  Right neck, and right base of tongue  COUNTS:  YES  PROCEDURE DETAILS: Patient was taken to the operating room and placed on the operating table in the supine position. Following induction of general anesthesia using laryngeal mask airway the right neck was prepped and draped in a standard fashion. A transverse incision was outlined overlying the palpable mass about 2 fingerbreadths below the angle of the mandible. Electrocautery was used to incise the skin and subcutaneous tissue as well as through the platysma. Blunt dissection reflecting the sternocleidomastoid muscle posteriorly revealed the underlying mass. A large piece, approximately 1 cubic centimeter was removed using electrocautery and sent for frozen section evaluation. While the frozen section was pending, and additional piece about 2-3 cm was then removed. There is lesion could not be removed in its entirety because of fixation to surrounding tissues. During the dissection, the mass was inadvertently dilated and some thick purulent material exuded from the mass. Cultures were taken. The frozen section came back as squamous cell carcinoma. Was then irrigated with saline. Hemostasis was completed using cautery. Incision was reapproximated using 4-0 chromic suture in the platysma layer and a subcuticular layer. Dermabond was used on the skin.  Because of the frozen section result, direct laryngoscopy and esophagoscopy was then performed. The patient was reintubated with a standard endotracheal tube  for the remainder of the procedure.  Direct laryngoscopy. The Jako laryngoscope was used to inspect the oral cavity, pharynx and larynx as well as the hypopharynx. The only abnormal finding was a slightly raised and slightly bloody and friable mass along the right base of tongue. Palpation with the finger revealed this to be approximately 3-4 cm in greatest dimension, slightly crossing the midline to the left side. It did not extend up into the tonsil or the faucil arches. Multiple biopsies were taken and sent for routine pathologic evaluation. No other lesions were identified.  Esophagoscopy. The cervical esophagus scope was used. Inspection down to the cricopharyngeus was performed was unable to pass through the cricopharyngeal inlet. I did not attempt any further for fear of possible perforation. Suction was used to evacuate blood and secretions. The patient was awakened extubated and transferred to recovery in stable condition.   PATIENT DISPOSITION:  PACU - hemodynamically stable.

## 2011-04-21 NOTE — Interval H&P Note (Signed)
History and Physical Interval Note:  04/21/2011 8:38 AM  Jose [name]  has presented today for surgery, with the diagnosis of LYMPHADENOPATHY AND NECK RIGHT SIDE  The various methods of treatment have been discussed with the patient and family. After consideration of risks, benefits and other options for treatment, the patient has consented to  Procedure(s): LYMPH NODE BIOPSY as a surgical intervention .  The patients' history has been reviewed, patient examined, no change in status, stable for surgery.  I have reviewed the patients' chart and labs.  Questions were answered to the patient's satisfaction.     Alphonsus Doyel H

## 2011-04-21 NOTE — OR Nursing (Signed)
Frozen tubed at 1117. Received at 1119.

## 2011-04-21 NOTE — Preoperative (Signed)
Beta Blockers   Reason not to administer Beta Blockers:Not Applicable 

## 2011-04-23 ENCOUNTER — Encounter (HOSPITAL_COMMUNITY): Payer: Self-pay | Admitting: Otolaryngology

## 2011-04-23 LAB — WOUND CULTURE

## 2011-04-27 ENCOUNTER — Other Ambulatory Visit: Payer: Self-pay | Admitting: Otolaryngology

## 2011-04-27 DIAGNOSIS — R591 Generalized enlarged lymph nodes: Secondary | ICD-10-CM

## 2011-04-28 ENCOUNTER — Telehealth: Payer: Self-pay | Admitting: Oncology

## 2011-04-28 LAB — ANAEROBIC CULTURE

## 2011-04-28 NOTE — Telephone Encounter (Signed)
called pts home to scheduled new pt apppt for 02/14.  d/t per pt.  will fax over letter to Dr. Pollyann Kennedy with d/t

## 2011-04-29 ENCOUNTER — Ambulatory Visit
Admission: RE | Admit: 2011-04-29 | Discharge: 2011-04-29 | Disposition: A | Discharge status: Home / Self Care | Payer: Medicare Other | Source: Ambulatory Visit | Attending: Otolaryngology | Admitting: Otolaryngology

## 2011-04-29 ENCOUNTER — Encounter: Payer: Self-pay | Admitting: *Deleted

## 2011-04-29 DIAGNOSIS — R591 Generalized enlarged lymph nodes: Secondary | ICD-10-CM

## 2011-04-29 MED ORDER — IOHEXOL 300 MG/ML  SOLN
50.0000 mL | Freq: Once | INTRAMUSCULAR | Status: AC | PRN
  Administered 2011-04-29: 50 mL via INTRAVENOUS

## 2011-04-30 ENCOUNTER — Ambulatory Visit
Admission: RE | Admit: 2011-04-30 | Discharge: 2011-04-30 | Disposition: A | Discharge status: Home / Self Care | Payer: Medicare Other | Source: Ambulatory Visit | Attending: Radiation Oncology | Admitting: Radiation Oncology

## 2011-04-30 ENCOUNTER — Encounter: Payer: Self-pay | Admitting: Radiation Oncology

## 2011-04-30 ENCOUNTER — Other Ambulatory Visit (HOSPITAL_COMMUNITY): Payer: Medicare Other | Admitting: Dentistry

## 2011-04-30 ENCOUNTER — Encounter (HOSPITAL_COMMUNITY): Payer: Self-pay | Admitting: Dentistry

## 2011-04-30 ENCOUNTER — Ambulatory Visit (HOSPITAL_COMMUNITY): Payer: Self-pay | Admitting: Dentistry

## 2011-04-30 VITALS — BP 121/75 | HR 70 | Temp 98.0°F | Resp 20 | Ht 72.0 in | Wt 175.1 lb

## 2011-04-30 DIAGNOSIS — C01 Malignant neoplasm of base of tongue: Secondary | ICD-10-CM

## 2011-04-30 DIAGNOSIS — Z87891 Personal history of nicotine dependence: Secondary | ICD-10-CM | POA: Insufficient documentation

## 2011-04-30 DIAGNOSIS — K121 Other forms of stomatitis: Secondary | ICD-10-CM | POA: Insufficient documentation

## 2011-04-30 DIAGNOSIS — I10 Essential (primary) hypertension: Secondary | ICD-10-CM | POA: Insufficient documentation

## 2011-04-30 DIAGNOSIS — J029 Acute pharyngitis, unspecified: Secondary | ICD-10-CM | POA: Insufficient documentation

## 2011-04-30 DIAGNOSIS — R1310 Dysphagia, unspecified: Secondary | ICD-10-CM | POA: Insufficient documentation

## 2011-04-30 DIAGNOSIS — Z79899 Other long term (current) drug therapy: Secondary | ICD-10-CM | POA: Insufficient documentation

## 2011-04-30 DIAGNOSIS — M264 Malocclusion, unspecified: Secondary | ICD-10-CM

## 2011-04-30 DIAGNOSIS — E785 Hyperlipidemia, unspecified: Secondary | ICD-10-CM | POA: Insufficient documentation

## 2011-04-30 DIAGNOSIS — Z85820 Personal history of malignant melanoma of skin: Secondary | ICD-10-CM | POA: Insufficient documentation

## 2011-04-30 DIAGNOSIS — K036 Deposits [accretions] on teeth: Secondary | ICD-10-CM

## 2011-04-30 DIAGNOSIS — C801 Malignant (primary) neoplasm, unspecified: Secondary | ICD-10-CM

## 2011-04-30 DIAGNOSIS — K08109 Complete loss of teeth, unspecified cause, unspecified class: Secondary | ICD-10-CM

## 2011-04-30 DIAGNOSIS — Z0189 Encounter for other specified special examinations: Secondary | ICD-10-CM

## 2011-04-30 DIAGNOSIS — K011 Impacted teeth: Secondary | ICD-10-CM

## 2011-04-30 DIAGNOSIS — Z853 Personal history of malignant neoplasm of breast: Secondary | ICD-10-CM | POA: Insufficient documentation

## 2011-04-30 DIAGNOSIS — R110 Nausea: Secondary | ICD-10-CM | POA: Insufficient documentation

## 2011-04-30 DIAGNOSIS — Z510 Encounter for antineoplastic radiation therapy: Secondary | ICD-10-CM | POA: Insufficient documentation

## 2011-04-30 NOTE — Patient Instructions (Signed)

## 2011-04-30 NOTE — Progress Notes (Signed)
Married, served in [employer], [place] war  Son recently passed away

## 2011-04-30 NOTE — Progress Notes (Signed)
DENTAL CONSULTATION  Date of Consultation:  04/30/2011 Patient Name:   Jose [name] Date of Birth:   07-16-1927 Medical Record Number: [id number]  VITALS: BP 140/85  Pulse 60  Temp 97.3 F (36.3 C)   CHIEF COMPLAINT: Patient needs a pre-chemoradiation therapy dental protocol evaluation.  HPI: Jose [name] is an 78 year old male referred by Dr. Beverlee Nims for dental consultation. 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 Patient with anticipated radiation therapy Dr. Mitzi Hansen and  chemotherapy with Dr. Gaylyn Rong. Patient now seen as part of a pre-chemoradiation therapy dental protocol evaluation.  Patient currently denies acute toothache, swellings, or abscesses. A she was last seen for denture adjustment approximately one to 2 years ago with Dr. Tonye Becket. Patient has not had periodontal therapy for" a long time". Patient has a maxillary acrylic partial denture that was fabricated 12 years ago. Patient does not have a lower partial denture.   PMH: Past Medical History  Diagnosis Date  . Blood transfusion     2006 had 4 units  . Arthritis     knees,back,shoulders  . Anxiety   . Right ureteral stone S/P URETERAL STENT 01-24-11  AND ESWL  03-01-11  . History of acute renal failure 01-23-11    DUE TO HYDRONEPHROSIS AND RIGHT STONE  . Normal cardiac stress test 09-25-2007  . Glaucoma   . Cataract immature   . Diverticulosis of colon   . History of GI diverticular bleed   . Hyperlipemia   . Impaired hearing NOT WEARING HIS BILATERL AIDS  . Head cold     COUGH , NO FEVER, SWELLING BILATERAL NODES (NECK)  . Urgency of urination     DUE TO URETERAL STONE AND STENT  . Frequency of urination   . Insomnia   . Anemia   . Chronic kidney disease     kidney stones  . GERD (gastroesophageal reflux disease)   . Hernia     inguinal  . Headache     migraines  . Glaucoma   . GI (gastrointestinal bleed)   . Hearing impairment      bilateral  . Trigeminal neuralgia   . Hypertension   . Male breast cancer 1988    S/P RIGHT MASTECTOMY W/ NODE DISSECTION AND CHEMO  . Cancer 04/21/11    tongue/soft tissue mass-inv squamous cell  . Skin cancer 2002    melanoma- back  . Trigeminal neuralgia     PSH: Past Surgical History  Procedure Date  . Cystoscopy w/ ureteral stent placement 01/24/2011    Procedure: CYSTOSCOPY WITH RETROGRADE PYELOGRAM/URETERAL STENT PLACEMENT;  Surgeon: Antony Haste, MD;  Location: [facility];  Service: Urology;  Laterality: Right;  . Right leg surg for fx AGE 23    patient states left thigh  . Craniectomy suboccipital for exploration / decompression cranial nerves 2003    5TH CRANIAL NERVE DECOMPRESSION  . Simple mastectomy 1988    MALE--- RIGHT BREAST W/ NODE DISSECTION  . Melanoma excision 10 YRS AGO    BACK AREA  . Extracorporeal shock wave lithotripsy 03-01-11    RIGHT  . Ureteroscopy 03/26/2011    Procedure: URETEROSCOPY;  Surgeon: Antony Haste, MD;  Location: [facility];  Service: Urology;  Laterality: Right;  RIGHT URETEROSCOPY, HOLMIUM LASER AND STENT C-ARM HOLMIUM LASER  . Examination under anesthesia 03/26/2011    Procedure: EXAM UNDER ANESTHESIA;  Surgeon: Antony Haste, MD;  Location:  [facility];  Service: Urology;  Laterality: N/A;  . Lymph node biopsy 04/21/2011    Procedure: LYMPH NODE BIOPSY;  Surgeon: Susy Frizzle, MD;  Location: [facility];  Service: ENT;  Laterality: Right;    ALLERGIES: Allergies  Allergen Reactions  . Hydromorphone Other (See Comments)    phlebitis  . Morphine And Related Other (See Comments)    phlebitis  . Codeine Other (See Comments)    Dizzy, mental status changes    MEDICATIONS: Current Outpatient Prescriptions  Medication Sig Dispense Refill  . amLODipine (NORVASC) 10 MG tablet Take 10 mg by mouth every morning.       . brimonidine (ALPHAGAN) 0.2 % ophthalmic solution Place 1 drop  into both eyes 2 (two) times daily.  5 mL    . gabapentin (NEURONTIN) 300 MG capsule Take 600 mg by mouth 2 (two) times daily before lunch and supper.       Marland Kitchen HYDROcodone-acetaminophen (LORTAB 7.5) 7.5-500 MG per tablet Take 1 tablet by mouth every 6 (six) hours as needed for pain.  30 tablet  0  . LORazepam (ATIVAN) 0.5 MG tablet Take 0.5 mg by mouth 2 (two) times daily as needed. ANXIETY       . Multiple Vitamins-Minerals (MULTIVITAMIN WITH MINERALS) tablet Take 1 tablet by mouth daily.       Marland Kitchen oxyCODONE (OXYCONTIN) 20 MG 12 hr tablet Take 20 mg by mouth every 12 (twelve) hours as needed. For pain      . oxymetazoline (AFRIN) 0.05 % nasal spray Place 1 spray into the nose 2 (two) times daily as needed. ALLERGIES       . traMADol (ULTRAM) 50 MG tablet Take 50 mg by mouth daily as needed. Maximum dose= 8 tablets per day For pain      . Travoprost, BAK Free, (TRAVATAMN) 0.004 % SOLN ophthalmic solution Place 1 drop into both eyes at bedtime.      . vitamin C (ASCORBIC ACID) 500 MG tablet Take 1,000 mg by mouth daily.       . vitamin E 400 UNIT capsule Take 400 Units by mouth daily.        No current facility-administered medications for this visit.   Facility-Administered Medications Ordered in Other Visits  Medication Dose Route Frequency Provider Last Rate Last Dose  . iohexol (OMNIPAQUE) 300 MG/ML solution 50 mL  50 mL Intravenous Once PRN Juline Patch, MD   50 mL at 04/29/11 1521    LABS: Lab Results  Component Value Date   WBC 8.4 04/21/2011   HGB 13.3 04/21/2011   HCT 39.4 04/21/2011   MCV 86.4 04/21/2011   PLT 246 04/21/2011      Component Value Date/Time   NA 143 04/21/2011 0900   K 4.4 04/21/2011 0900   CL 106 04/21/2011 0900   CO2 27 04/21/2011 0900   GLUCOSE 117* 04/21/2011 0900   BUN 21 04/21/2011 0900   CREATININE 1.60* 04/21/2011 0900   CALCIUM 9.2 04/21/2011 0900   GFRNONAA 39* 04/21/2011 0900   GFRAA 45* 04/21/2011 0900   No results found for this basename: INR, PROTIME    No results found for this basename: PTT    SOCIAL HISTORY: History   Social History  . Marital Status: Married    Spouse Name: N/A    Number of Children: N/A  . Years of Education: N/A   Occupational History  . Not on file.   Social History Main Topics  . Smoking  status: Former Smoker -- 0.2 packs/day for 30 years    Types: Cigars, Cigarettes    Quit date: 12/20/1980  . Smokeless tobacco: Former Neurosurgeon    Quit date: 12/20/1980  . Alcohol Use: No  . Drug Use: No  . Sexually Active: Yes   Other Topics Concern  . Not on file   Social History Narrative  . No narrative on file    FAMILY HISTORY: Family History  Problem Relation Age of Onset  . Heart disease Mother   . Heart disease Father   . Cancer Sister     "perineum"  . Cancer Paternal Grandmother     stomach     REVIEW OF SYSTEMS: Otherwise negative.  DENTAL HISTORY: CHIEF COMPLAINT: Patient needs a pre-chemoradiation therapy dental protocol evaluation.  HPI: Jaideep Pollack is an 78 year old male referred by Dr. Beverlee Nims for dental consultation. 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 Patient with anticipated radiation therapy Dr. Mitzi Hansen and  chemotherapy with Dr. Gaylyn Rong. Patient now seen as part of a pre-chemoradiation therapy dental protocol evaluation.  Patient currently denies acute toothache, swellings, or abscesses. A she was last seen for denture adjustment approximately one to 2 years ago with Dr. Tonye Becket. Patient has not had periodontal therapy for" a long time". Patient has a maxillary acrylic partial denture that was fabricated 12 years ago. Patient does not have a lower partial denture.   DENTAL EXAMINATION:  GENERAL: Patient is a well-developed, well-nourished male in no acute distress. HEAD AND NECK: Patient with right neck and left neck lymphadenopathy. The right neck is consistent with previous node biopsy with Dr. Pollyann Kennedy.  Patient denies acute TMJ symptoms but does have left TMJ crepitus. INTRAORAL EXAM: Patient has normal saliva. Patient has a mid palatal torus. Patient has a lateral exostosis in the area of tooth #30 on the lingual aspect. There is no evidence of abscess formation. DENTITION: Patient with multiple missing teeth numbers 1, 2, 3, 7, 8, 9, 10, 14, 15, 16, 18, 19, 20, 21, 22, 26, 28, 29, and 30. Tooth #32 is a full bony impaction. PERIODONTAL: Patient has chronic periodontitis with plaque and calculus accumulations, selective areas of gingival recession, and no significant tooth mobility noted. DENTAL CARIES/SUBOPTIMAL RESTORATIONS: Patient has multiple dental caries and suboptimal dental restorations as per dental charting form. ENDODONTIC: Patient currently denies acute pulpitis symptoms. Patient has had multiple root canal therapies with no obvious persistent periapical pathology or radiolucency. CROWN AND BRIDGE: She has multiple crown restorations some of which are suboptimal secondary to recurrent caries. Please see dental charting form. PROSTHODONTIC: Patient with the maxillary acrylic partial denture that is clinically acceptable. There is no lower partial denture. OCCLUSION: Patient with a deep overbite and a generalized malocclusion. The occlusion is stable at this time however.  RADIOGRAPHIC INTERPRETATION: A panoramic x-ray was taken and supplemented with 9 periapical radiographs. There are multiple missing teeth. There are multiple dental caries noted. There is an impacted tooth #32 . There is supra-eruption and drifting of the unopposed teeth into the edentulous areas. There are multiple root canal therapies noted. There is no evidence of periapical pathology at this time.   ASSESSMENTS: 1. Extensive dental caries and suboptimal dental restorations. 2. Impacted tooth #32. 3. Chronic periodontitis with bone loss 4. Plaque and calculus accumulation 5. Mid palatal torus 6. Mandibular  right lingual exostosis in the area of numbers 30. 7. Supra-eruption and drifting of the  unopposed teeth into the edentulous. 8. Poor occlusal scheme but a stable occlusion. 9. Multiple medical co-morbidities    PLAN/RECOMMENDATIONS: 1. I discussed the risks, benefits, and complications of various treatment options with the patient in relationship to his medical and dental conditions and anticipated chemoradiation therapy. We discussed various treatment options to include no treatment, extraction of teeth in primary field radiation therapy, extraction of the impacted wisdom tooth #32, multiple extractions with alveoloplasty, pre-prosthetic surgery as indicated, periodontal therapy, dental restorations, root canal therapy, crown and bridge therapy, implant therapy, and replacement of missing teeth as indicated after adequate healing and approximately 3 months after the last radiation therapy is complete. The patient is currently thinking about his options but is leaning towards extraction of all remaining teeth with alveoloplasty and pre-prosthetic surgery as indicated with an oral surgeon due to the complexity of the dental extractions. Patient is interested in having me discuss the plan of care with Dr. Tonye Becket (primary dentist) prior to referral to an oral surgeon however. Dr. Melynda Ripple has been contacted and I am awaiting his return call if this time. After discussion with Dr. Melynda Ripple, I will refer to oral surgeon as indicated.  2. Provision of written and verbal information on" radiation therapy and your mouth" as well as" chemotherapy and your her mouth"-today. 3.. Discussion of findings with medical team and coordination of future medical and dental care as indicated.  Charlynne Pander, DDS

## 2011-04-30 NOTE — Progress Notes (Signed)
Please see the Nurse Progress Note in the MD Initial Consult Encounter for this patient. 

## 2011-05-02 NOTE — Progress Notes (Signed)
CC:   Jefry H. Pollyann Kennedy, MD Exie Parody, M.D. Cindra Eves, D.D.S.  REFERRING PHYSICIAN:  Jefry H. Pollyann Kennedy, MD  DIAGNOSIS:  Squamous cell carcinoma of the base of tongue, T2 N2c M0.  HISTORY OF PRESENT ILLNESS:  The patient is an 78 year old male with a complaint of slowly enlarging bilateral neck nodes in addition to some soreness of the throat.  The patient had kidney stones he indicates approximately 1 month ago and he felt that this may have been related to this.  However, his neck nodes increased in size over time although he feels that they may be slightly smaller than their maximum.  He has lost approximately 15 pounds, and this is due to some pain on swallowing.  He has been taking some pain medicine p.r.n.  He has noticed a little bit of hoarseness as well.  The patient was seen by Dr. Pollyann Kennedy given this diagnosis of cervical lymphadenopathy and a lymph node biopsy and laryngoscopy was performed on 04/21/2011.  The palpable mass on the left appeared to be fixed to the surrounding tissues.  Biopsy was obtained and this returned positive for invasive squamous cell carcinoma.  The patient saw a raised and slightly bloody and friable mass along the right base of tongue measuring approximately 3-4 mm.  Multiple biopsies were taken of this and this also returned positive for invasive squamous cell carcinoma.  Therefore, both the right neck mass and the tongue base biopsy on the right returned positive for squamous cell carcinoma.  The patient had a CT scan of the neck on 04/29/2011.  The invasive squamous cell carcinoma of the base of tongue was seen and this crossed midline. The component on the right was larger, measuring 29 x 25 x 38 mm, and this extended down the base of the tongue to the vallecula on the right. On the left this measured 10 x 21 x 19 mm and extended into the floor of the vallecula.  The tumor extends to the base of the epiglottis bilaterally.  Bilateral cervical  lymphadenopathy was also seen.  Cross sectional measurement of the right-sided mass which was biopsied were 42 x 26 x 33 mm.  On the left there was a 24 x 23 mm level 2 node or group of nodes with central necrosis.  In addition non-necrotic right-sided level 2 lymph node was seen measuring 13 x 16 mm.  This was consistent with right greater than left invasive tongue squamous cell carcinoma with bilateral level 2/3 lymphadenopathy.  Given this finding the patient has been referred to me for consideration of a possible course of definitive radiotherapy.  PAST MEDICAL HISTORY: 1. The patient has a history of breast cancer in 1989.  He has no     history of radiation treatment and he has done well with his breast     cancer. 2. Hypertension. 3. Arthritis. 4. Anxiety. 5. History of kidney stones. 6. History of glaucoma. 7. History of cataracts. 8. Hyperlipidemia. 9. History of trigeminal neuralgia. 10.History of right leg surgery for a fracture. 11.History of melanoma on the back with surgery alone.  CURRENT MEDICATIONS:  Norvasc, Alphagan, Neurontin, Ativan, multivitamins, OxyContin, Afrin, Ultram, Travatan, ascorbic acid, vitamin E.  ALLERGIES:  Hydromorphone, morphine and codeine.  FAMILY HISTORY:  The patient states that his grandmother suffered from "pelvic cancer".  SOCIAL HISTORY:  The patient is married.  He has a son who recently died.  The patient quit smoking approximately 30 years ago.  He drinks alcohol on  an occasional basis.  He has no history of illicit drug use.  REVIEW OF SYSTEMS:  Negative other than as above.  PHYSICAL EXAMINATION:  Vital signs:  Weight 175 pounds, blood pressure 121/75, pulse 70, respiratory rate 20, temperature 98.0.  General:  A well-developed male in no acute distress.  Alert and oriented times 3. HEENT:  Normocephalic, atraumatic.  Pupils equal, round and reactive to light.  Extraocular movements intact.  Oral cavity examination shows  no lesions or masses anteriorly.  A friable appearing mass is visible in the base of tongue region although the patient has a strong gag reflex, limiting this visual exam to some degree.  Neck:  The neck shows easily palpable bilateral level 2/3 lymph nodes which are firm. Cardiovascular:  Regular rate and rhythm.  Respiratory:  Clear to auscultation.  GI:  Abdomen soft, nontender.  Normal bowel sounds. Extremities:  No edema present.  Fiberoptic exam:  After the use of topical anesthetic a scope was passed through the right naris.  Good visualization was obtained of the larynx and oropharynx, and a friable mass is present in the base of tongue region and greater on the right.  This does extend down into the vallecula.  Multiple pictures were taken of this.  No further inferior extension was seen with no suspicious findings in the larynx or hypopharynx.  IMPRESSION AND PLAN:  Mr. Seago is a pleasant 78 year old gentleman with a recent diagnosis of squamous cell carcinoma of the base of tongue, and this appears clinically to represent a T3 N2c tumor.  The patient, I believe, is a good candidate for a definitive course of chemoradiotherapy.  I discussed the details of such a treatment with the patient.  I would anticipate a 6-1/2 week course of treatment.  The patient is seeing Dr. Kristin Bruins later this morning which I believe will be important given his dentition and his anticipated radiation treatment.  He is also scheduled to see Dr. Gaylyn Rong next week.  I also discussed additional issues with the patient, including my recommendation to proceed with a PET scan to help further stage the patient and to highlight any additional disease within the head and neck region.  I would also like to have the patient seen in speech and swallowing therapy and also I discussed with the patient my recommendation for him to have a feeding tube placed at this time.  He is elderly and has lost approximately 50  pounds thus far and I believe this will be necessary as he goes through treatment.  I will help coordinate each of these issues and once his dental evaluation is complete I will proceed with scheduling the patient for a simulation at the appropriate time.  All of this was discussed with the patient and his wife and all of their questions were answered.    ______________________________ Radene Gunning, M.D., Ph.D. JSM/MEDQ  D:  05/02/2011  T:  05/02/2011  Job:  [id number]

## 2011-05-03 ENCOUNTER — Telehealth: Payer: Self-pay | Admitting: *Deleted

## 2011-05-06 ENCOUNTER — Other Ambulatory Visit: Payer: Medicare Other | Admitting: Lab

## 2011-05-06 ENCOUNTER — Ambulatory Visit (HOSPITAL_BASED_OUTPATIENT_CLINIC_OR_DEPARTMENT_OTHER): Payer: Medicare Other | Admitting: Oncology

## 2011-05-06 ENCOUNTER — Ambulatory Visit: Payer: Medicare Other

## 2011-05-06 ENCOUNTER — Telehealth: Payer: Self-pay | Admitting: Oncology

## 2011-05-06 VITALS — BP 140/88 | HR 60 | Temp 97.2°F | Ht 71.0 in | Wt 179.3 lb

## 2011-05-06 DIAGNOSIS — C801 Malignant (primary) neoplasm, unspecified: Secondary | ICD-10-CM

## 2011-05-06 DIAGNOSIS — C770 Secondary and unspecified malignant neoplasm of lymph nodes of head, face and neck: Secondary | ICD-10-CM

## 2011-05-06 DIAGNOSIS — C01 Malignant neoplasm of base of tongue: Secondary | ICD-10-CM

## 2011-05-06 DIAGNOSIS — C7839 Secondary malignant neoplasm of other respiratory organs: Secondary | ICD-10-CM

## 2011-05-06 LAB — COMPREHENSIVE METABOLIC PANEL
ALT: 23 U/L (ref 0–53)
Albumin: 3.7 g/dL (ref 3.5–5.2)
Alkaline Phosphatase: 62 U/L (ref 39–117)
Potassium: 4.4 mEq/L (ref 3.5–5.3)
Sodium: 140 mEq/L (ref 135–145)
Total Bilirubin: 0.3 mg/dL (ref 0.3–1.2)
Total Protein: 6.5 g/dL (ref 6.0–8.3)

## 2011-05-06 NOTE — Telephone Encounter (Signed)
gv pt appt for [id number] °

## 2011-05-07 ENCOUNTER — Encounter: Payer: Self-pay | Admitting: *Deleted

## 2011-05-07 NOTE — Progress Notes (Signed)
Faxed [facility] report to Dr. Amador Cunas office [phone].  (Cr 1.64)

## 2011-05-08 ENCOUNTER — Encounter: Payer: Self-pay | Admitting: Oncology

## 2011-05-08 NOTE — Progress Notes (Signed)
[facility] MEDICAL ONCOLOGY - INITIAL CONSULATION  Referral MD:  Dr. Serena Colonel, M.D.  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   HPI: [name] is an 78 year-old man with history of smoking.  He presented with kidney stone in November 2011.  He underwent ureteral stent placement.  Since then, he also developed bilateral neck node swelling.  It has been progressive to the size of a small lime R>L.  There was no pain, purulent discharge, skin breakthrough.  He also developed sore throat.  He had about 20-lb non-intentional weight loss in the interval.  He was referred to Dr. Pollyann Kennedy and underwent on 04/13/2011 FNA of the right neck node with nondiagnostic path but concerning for atypical cells.  He underwent on 04/21/2011 direct laryngoscopy in the OR which showed right base of the tongue ulceration and excisional biopsy of the right neck node.  They both showed squamous cell carcinoma.  He was kindly referred to the [facility] for evaluation.  Jose [name] presented to the clinic for the first time today with his wife.  He reported that he still has some discomfort in the right neck from the excision.  The wound has healed up without purulent discharge or erythema.  He still has some sore throat and thus he has had decreased appetite and continuing weight loss.  His stamina now is much less than before.  He used to do yard work last year; he has not been doing heavy chores.  He is still independent of activities of daily living.  His left cervical neck node is still present but it has not grown any further.   Patient denies headache, visual changes, confusion, drenching night sweats, nausea vomiting, jaundice, chest pain, palpitation, shortness of breath, dyspnea on exertion, productive cough, gum bleeding, epistaxis, hematemesis, hemoptysis, abdominal pain, abdominal swelling, early satiety, melena, hematochezia, hematuria, skin rash,  spontaneous bleeding, joint swelling, joint pain, heat or cold intolerance, bowel bladder incontinence, back pain, focal motor weakness, paresthesia, depression, suicidal or homocidal ideation, feeling hopelessness.     Past Medical History  Diagnosis Date  . Blood transfusion     2006 had 4 units  . Arthritis     knees,back,shoulders  . Anxiety   . Right ureteral stone S/P URETERAL STENT 01-24-11  AND ESWL  03-01-11  . History of acute renal failure 01-23-11    DUE TO HYDRONEPHROSIS AND RIGHT STONE  . Normal cardiac stress test 09-25-2007  . Glaucoma   . Cataract immature   . Diverticulosis of colon   . History of GI diverticular bleed   . Hyperlipemia   . Impaired hearing NOT WEARING HIS BILATERL AIDS  . Urgency of urination     DUE TO URETERAL STONE AND STENT  . Frequency of urination   . Insomnia   . Anemia   . Chronic kidney disease     kidney stones  . GERD (gastroesophageal reflux disease)   . Hernia     inguinal  . Headache     migraines  . Glaucoma   . Hearing impairment     bilateral  . Trigeminal neuralgia   . Hypertension   . Male breast cancer 1988    S/P RIGHT MASTECTOMY W/ NODE DISSECTION AND CHEMO  . 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 Skin cancer 2002    melanoma- back  :  Past Surgical History  Procedure Date  . Cystoscopy w/ ureteral stent placement 01/24/2011  Procedure: CYSTOSCOPY WITH RETROGRADE PYELOGRAM/URETERAL STENT PLACEMENT;  Surgeon: Antony Haste, MD;  Location: [facility];  Service: Urology;  Laterality: Right;  . Right leg surg for fx AGE 74    patient states left thigh  . Craniectomy suboccipital for exploration / decompression cranial nerves 2003    5TH CRANIAL NERVE DECOMPRESSION  . Simple mastectomy 1988    MALE--- RIGHT BREAST W/ NODE DISSECTION  . Melanoma excision 10 YRS AGO    BACK AREA  . Extracorporeal shock wave lithotripsy 03-01-11    RIGHT  . Ureteroscopy 03/26/2011    Procedure: URETEROSCOPY;  Surgeon: Antony Haste, MD;  Location: [facility];  Service: Urology;  Laterality: Right;  RIGHT URETEROSCOPY, HOLMIUM LASER AND STENT C-ARM HOLMIUM LASER  . Examination under anesthesia 03/26/2011    Procedure: EXAM UNDER ANESTHESIA;  Surgeon: Antony Haste, MD;  Location: [facility];  Service: Urology;  Laterality: N/A;  . Lymph node biopsy 04/21/2011    Procedure: LYMPH NODE BIOPSY;  Surgeon: Susy Frizzle, MD;  Location: [facility];  Service: ENT;  Laterality: Right;  :  Current Outpatient Prescriptions  Medication Sig Dispense Refill  . amLODipine (NORVASC) 10 MG tablet Take 10 mg by mouth every morning.       . brimonidine (ALPHAGAN) 0.2 % ophthalmic solution Place 1 drop into both eyes 2 (two) times daily.  5 mL    . gabapentin (NEURONTIN) 300 MG capsule Take 600 mg by mouth 2 (two) times daily before lunch and supper.       Marland Kitchen LORazepam (ATIVAN) 0.5 MG tablet Take 0.5 mg by mouth 2 (two) times daily as needed. ANXIETY       . Multiple Vitamins-Minerals (MULTIVITAMIN WITH MINERALS) tablet Take 1 tablet by mouth daily.       Marland Kitchen oxyCODONE (OXYCONTIN) 20 MG 12 hr tablet Take 20 mg by mouth every 12 (twelve) hours as needed. For pain      . oxymetazoline (AFRIN) 0.05 % nasal spray Place 1 spray into the nose 2 (two) times daily as needed. ALLERGIES       . traMADol (ULTRAM) 50 MG tablet Take 50 mg by mouth daily as needed. Maximum dose= 8 tablets per day For pain      . Travoprost, BAK Free, (TRAVATAMN) 0.004 % SOLN ophthalmic solution Place 1 drop into both eyes at bedtime.      . vitamin C (ASCORBIC ACID) 500 MG tablet Take 1,000 mg by mouth daily.       . vitamin E 400 UNIT capsule Take 400 Units by mouth daily.          Allergies  Allergen Reactions  . Hydromorphone Other (See Comments)    phlebitis  . Morphine And Related Other (See Comments)    phlebitis  . Codeine Other (See Comments)    Dizzy, mental status changes  :  Family History  Problem  Relation Age of Onset  . Heart disease Mother   . Heart disease Father   . Cancer Sister     "perineum"  . Cancer Paternal Grandmother     stomach  :  History   Social History  . Marital Status: Married    Spouse Name: N/A    Number of Children: N/A  . Years of Education: N/A   Occupational History  . Not on file.   Social History Main Topics  . Smoking status: Former Smoker -- 0.2 packs/day for 30 years  Types: Cigars, Cigarettes    Quit date: 12/20/1980  . Smokeless tobacco: Former Neurosurgeon    Quit date: 12/20/1980  . Alcohol Use: No  . Drug Use: No  . Sexually Active: Yes   Other Topics Concern  . Not on file   Social History Narrative  . No narrative on file    Pertinent items are noted in HPI.  Exam:   General:  well-nourished in no acute distress.  Eyes:  no scleral icterus.  ENT:  There were no oropharyngeal lesions.  Neck was without thyromegaly.  Lymphatics:  Positive for a 3cm left cervical node. Right cervical node excision wound has healed without erythema, purulent discharge.  There was no supraclavicular or axillary adenopathy.  Respiratory: lungs were clear bilaterally without wheezing or crackles.  Cardiovascular:  Regular rate and rhythm, S1/S2, without murmur, rub or gallop.  There was no pedal edema.  GI:  abdomen was soft, flat, nontender, nondistended, without organomegaly.  Muscoloskeletal:  no spinal tenderness of palpation of vertebral spine.  Skin exam was without echymosis, petichae.  Neuro exam was nonfocal.  Patient was able to get on and off exam table without assistance.  Gait was normal.  Patient was alerted and oriented.  Attention was good.   Language was appropriate.  Mood was normal without depression.  Speech was not pressured.  Thought content was not tangential.     LABS:  Cr 1.6; LFT within normal limit WBC 8.4; Hgb 13.3; Plt 246  04/21/2011  *RADIOLOGY REPORT*  Clinical Data: Cervical lymphadenopathy.  Cough.  Preoperative for  nodal excision.  CHEST - 2 VIEW  Comparison: 09/15/2007  Findings: Right axillary clips noted.  There is atherosclerotic calcification in the aortic arch.  Heart size is within normal limits.  Large lung volumes noted.  IMPRESSION:  1.  Aortic arch atherosclerotic calcification. 2.  The lungs appear clear.  Original Report Authenticated By: Dellia Cloud, M.D.   Ct Soft Tissue Neck W Contrast:  I personally reviewed this CT neck and showed to the patient and his wife the images.   04/29/2011  *RADIOLOGY REPORT*  Clinical Data: Bilateral cervical lymphadenopathy. Squamous cell carcinoma of the tongue on recent biopsy.  CT NECK WITH CONTRAST  Technique:  Multidetector CT imaging of the neck was performed with intravenous contrast.  Contrast: 50mL OMNIPAQUE IOHEXOL 300 MG/ML IV SOLN  Comparison: None.  Findings: Suprahyoid neck:  Invasive squamous cell carcinoma of the tongue crosses the midline.  The component on the right is larger and the site of primary tumor, measuring 29 x 25 x 38 mm.  This extends down the base of the tongue to the vallecula  on the right. On the left the tumor measures roughly 10 x 21 x 19 mm and extends into the floor of the vallecula.  Tumor extends into the base of the epiglottis bilaterally.  Major and minor salivary glands unremarkable.  Larynx:  Normal.  Infrahyoid neck:  No masses other than lymphadenopathy described below.  Lymph nodes:  Bilateral cervical lymphadenopathy is present.  There has been a biopsy of a right level II/III node with resultant air- fluid level.  Cross-sectional measurements on image 42 are 26 x 23 mm. On the left, a level II node or group of nodes measures 24 x 23 mm, with central necrosis.   A non necrotic right sided level II node is seen on image 29 which has cross-sectional measurements of 13 x 16 mm.  Upper chest/mediastinum:  Normal appearance.  Additional:  Visualized intracranial compartment suggests cerebellar atrophy.  No visible sinus or  mastoid fluid.  Moderate spondylosis, worst at C4-5 with central and rightward protrusion. Craniocervical vasculature patent with mild bilateral carotid calcification.  IMPRESSION: Right greater than left invasive tongue squamous cell carcinoma, with bilateral level II/III lymphadenopathy.  No osseous destructive lesion.  Original Report Authenticated By: Elsie Stain, M.D.    Dg Chest 2 View  04/21/2011  *RADIOLOGY REPORT*  Clinical Data: Cervical lymphadenopathy.  Cough.  Preoperative for nodal excision.  CHEST - 2 VIEW  Comparison: 09/15/2007  Findings: Right axillary clips noted.  There is atherosclerotic calcification in the aortic arch.  Heart size is within normal limits.  Large lung volumes noted.  IMPRESSION:  1.  Aortic arch atherosclerotic calcification. 2.  The lungs appear clear.  Original Report Authenticated By: Dellia Cloud, M.D.   Ct Soft Tissue Neck W Contrast  04/29/2011  *RADIOLOGY REPORT*  Clinical Data: Bilateral cervical lymphadenopathy. Squamous cell carcinoma of the tongue on recent biopsy.  CT NECK WITH CONTRAST  Technique:  Multidetector CT imaging of the neck was performed with intravenous contrast.  Contrast: 50mL OMNIPAQUE IOHEXOL 300 MG/ML IV SOLN  Comparison: None.  Findings: Suprahyoid neck:  Invasive squamous cell carcinoma of the tongue crosses the midline.  The component on the right is larger and the site of primary tumor, measuring 29 x 25 x 38 mm.  This extends down the base of the tongue to the vallecula  on the right. On the left the tumor measures roughly 10 x 21 x 19 mm and extends into the floor of the vallecula.  Tumor extends into the base of the epiglottis bilaterally.  Major and minor salivary glands unremarkable.  Larynx:  Normal.  Infrahyoid neck:  No masses other than lymphadenopathy described below.  Lymph nodes:  Bilateral cervical lymphadenopathy is present.  There has been a biopsy of a right level II/III node with resultant air- fluid level.   Cross-sectional measurements on image 42 are 26 x 23 mm. On the left, a level II node or group of nodes measures 24 x 23 mm, with central necrosis.   A non necrotic right sided level II node is seen on image 29 which has cross-sectional measurements of 13 x 16 mm.  Upper chest/mediastinum:  Normal appearance.  Additional:  Visualized intracranial compartment suggests cerebellar atrophy.  No visible sinus or mastoid fluid.  Moderate spondylosis, worst at C4-5 with central and rightward protrusion. Craniocervical vasculature patent with mild bilateral carotid calcification.  IMPRESSION: Right greater than left invasive tongue squamous cell carcinoma, with bilateral level II/III lymphadenopathy.  No osseous destructive lesion.  Original Report Authenticated By: Elsie Stain, M.D.    Assessment and Plan:   1.  HLP:  He is on diet control. 2.  HTN:  He is on Amlodipine.  3.  Trigeminal neuralgia:  He is on neurontin and tramadol. 4.  History of smoking:  He quit in 1982.  5.  History of kidney stone in 2012 s/p stent. 6.  History of male breast cancer in 1989:  Patient is not aware of stage.  S/p mastectomy; s/p adjuvant chemoradiation; s/p 5 years of adjuvant Tamoxifen in distant past.  He is in remission from this respect. 7.  Chronic Kidney disease. 8.  Newly diagnosed cT4 N2c Mx right base of the tongue with extension to bilateral epiglottis and bilateral cervical neck node.    Staging and Prognosis:  I discussed with Mr. Pickering  and this wife that he has at least stage IVB given bilateral node involvement.  Given locally extensive disease, I recommended PET scan to rule to distant met.  Given his age, and history of smoking and at least stage IVB, his prognosis is not as good otherwise.  The chance of cure is only about 50%.    Treatment options:  No role for primary resection given location and extend of disease.  He is a candidate for concurrent definitive chemoradiation.  Patient asked for  prognosis without treatment.  Without therapy, he will face iminent local progression with painful disease spread, bleeding, problem swallow/speaking.  I recommended at least consider radiation therapy for local control.  Chemotherapy in his situation (assuming no widespread met) is to improve radio-sensitivity.  He is not a candidate for Cisplatin due to his age, hearing loss, and chronic renal insufficiency. Carboplatin/Taxol is a good option given his high risk features.  However, given his age, he is at high risk of mucositis and other complications.  Erbitux is an option which he may tolerate well; but with stage IVB, it may not be enough for systemic control.  I discussed with patient side effects of combination regimen of Carboplatin andTaxol which include but not limited to mucositis, cytopenia, infection, infusion reaction, neuropathy, myalgia, arthralgia, pneumonitis. I discussed with patient side effects of Erbitux which include but not limited to skin rash, electrolytes abnormality, infusion reaction, diarrhea.  Mr. Romer and his wife would like to think over his options.  He will have staging PET scan next Tuesday 04/10/11.  He will then proceed with dental extraction prior to radiation assuming that he does not have distant metastatic disease.  I will see him again in about 2 weeks with his wife and daughter to finalize his decision.  The length of time of the face-to-face encounter was 45    minutes. More than 50% of time was spent counseling and coordination of care.

## 2011-05-10 ENCOUNTER — Encounter: Payer: Self-pay | Admitting: *Deleted

## 2011-05-10 NOTE — Progress Notes (Signed)
[facility] Psychosocial Distress Screening Clinical Social Work  Clinical Social Work was referred by distress screening protocol.  The patient scored a 5 on the Psychosocial Distress Thermometer which indicates moderate distress. Clinical Social Worker was unable to meet with pt in the exam room; therefore contacted pt at home to assess for distress and other psychosocial needs.  Pt stated he was "doing fine", and did not have any urgent needs at this time.  CSW offered support and informed pt on the supportive services at [facility].  Pt stated he had an appointment tomorrow and would be getting test result; which would determine treatment options/prognosis.  CSW encouraged pt to call if he had emotional or practical concerns associated with the test results.  Pt was appreciative of the contact, and stated he would call with concerns.     Clinical Social Worker follow up needed: Not at this time  Tamala Julian, MSW, LCSW Clinical Social Worker [facility] [phone]

## 2011-05-11 ENCOUNTER — Encounter: Payer: Self-pay | Admitting: Nutrition

## 2011-05-11 ENCOUNTER — Telehealth (HOSPITAL_COMMUNITY): Payer: Self-pay | Admitting: Dentistry

## 2011-05-11 ENCOUNTER — Encounter (HOSPITAL_COMMUNITY)
Admission: RE | Admit: 2011-05-11 | Discharge: 2011-05-11 | Disposition: A | Discharge status: Home / Self Care | Payer: Medicare Other | Source: Ambulatory Visit | Attending: Radiation Oncology | Admitting: Radiation Oncology

## 2011-05-11 DIAGNOSIS — R221 Localized swelling, mass and lump, neck: Secondary | ICD-10-CM | POA: Insufficient documentation

## 2011-05-11 DIAGNOSIS — R220 Localized swelling, mass and lump, head: Secondary | ICD-10-CM | POA: Insufficient documentation

## 2011-05-11 DIAGNOSIS — C01 Malignant neoplasm of base of tongue: Secondary | ICD-10-CM | POA: Insufficient documentation

## 2011-05-11 DIAGNOSIS — R599 Enlarged lymph nodes, unspecified: Secondary | ICD-10-CM | POA: Insufficient documentation

## 2011-05-11 DIAGNOSIS — R918 Other nonspecific abnormal finding of lung field: Secondary | ICD-10-CM | POA: Insufficient documentation

## 2011-05-11 MED ORDER — FLUDEOXYGLUCOSE F - 18 (FDG) INJECTION
16.3000 | Freq: Once | INTRAVENOUS | Status: AC | PRN
  Administered 2011-05-11: 16.3 via INTRAVENOUS

## 2011-05-12 ENCOUNTER — Other Ambulatory Visit: Payer: Self-pay | Admitting: Oncology

## 2011-05-12 ENCOUNTER — Other Ambulatory Visit (HOSPITAL_COMMUNITY): Payer: Self-pay | Admitting: Oncology

## 2011-05-12 DIAGNOSIS — C109 Malignant neoplasm of oropharynx, unspecified: Secondary | ICD-10-CM

## 2011-05-12 DIAGNOSIS — C029 Malignant neoplasm of tongue, unspecified: Secondary | ICD-10-CM

## 2011-05-12 DIAGNOSIS — K1230 Oral mucositis (ulcerative), unspecified: Secondary | ICD-10-CM

## 2011-05-12 DIAGNOSIS — R634 Abnormal weight loss: Secondary | ICD-10-CM

## 2011-05-13 ENCOUNTER — Telehealth: Payer: Self-pay | Admitting: Oncology

## 2011-05-13 NOTE — Telephone Encounter (Signed)
called pts wife and provided appt for f/u on 02/27 and peg tube placement for 02/25 @ [facility]

## 2011-05-14 ENCOUNTER — Other Ambulatory Visit: Payer: Self-pay | Admitting: Physician Assistant

## 2011-05-14 ENCOUNTER — Other Ambulatory Visit: Payer: Self-pay | Admitting: Radiology

## 2011-05-14 NOTE — Telephone Encounter (Signed)
xxx

## 2011-05-17 ENCOUNTER — Ambulatory Visit (HOSPITAL_COMMUNITY)
Admission: RE | Admit: 2011-05-17 | Discharge: 2011-05-17 | Disposition: A | Discharge status: Home / Self Care | Payer: Medicare Other | Source: Ambulatory Visit | Attending: Radiation Oncology | Admitting: Radiation Oncology

## 2011-05-17 VITALS — BP 164/83 | HR 57 | Temp 97.4°F | Resp 13 | Ht 72.0 in | Wt 175.0 lb

## 2011-05-17 DIAGNOSIS — C01 Malignant neoplasm of base of tongue: Secondary | ICD-10-CM | POA: Insufficient documentation

## 2011-05-17 LAB — CBC
HCT: 40.6 % (ref 39.0–52.0)
MCH: 30.3 pg (ref 26.0–34.0)
MCV: 86.6 fL (ref 78.0–100.0)
RBC: 4.69 MIL/uL (ref 4.22–5.81)
WBC: 8.6 10*3/uL (ref 4.0–10.5)

## 2011-05-17 LAB — BASIC METABOLIC PANEL
CO2: 28 mEq/L (ref 19–32)
Chloride: 103 mEq/L (ref 96–112)
Creatinine, Ser: 1.51 mg/dL — ABNORMAL HIGH (ref 0.50–1.35)
Glucose, Bld: 123 mg/dL — ABNORMAL HIGH (ref 70–99)
Sodium: 137 mEq/L (ref 135–145)

## 2011-05-17 LAB — APTT: aPTT: 33 seconds (ref 24–37)

## 2011-05-17 MED ORDER — GLUCAGON HCL (RDNA) 1 MG IJ SOLR
INTRAMUSCULAR | Status: AC | PRN
  Administered 2011-05-17: .5 mg via INTRAVENOUS

## 2011-05-17 MED ORDER — MIDAZOLAM HCL 2 MG/2ML IJ SOLN
INTRAMUSCULAR | Status: AC
  Filled 2011-05-17: qty 4

## 2011-05-17 MED ORDER — MIDAZOLAM HCL 5 MG/5ML IJ SOLN
INTRAMUSCULAR | Status: AC | PRN
  Administered 2011-05-17: 2 mg via INTRAVENOUS

## 2011-05-17 MED ORDER — SODIUM CHLORIDE 0.9 % IV SOLN: Freq: Once | INTRAVENOUS | Status: DC

## 2011-05-17 MED ORDER — CEFAZOLIN SODIUM 1-5 GM-% IV SOLN: 1.0000 g | Freq: Once | INTRAVENOUS | Status: DC

## 2011-05-17 MED ORDER — IOHEXOL 300 MG/ML  SOLN: 20.0000 mL | Freq: Once | INTRAMUSCULAR | Status: AC | PRN

## 2011-05-17 MED ORDER — GLUCAGON HCL (RDNA) 1 MG IJ SOLR
INTRAMUSCULAR | Status: AC
  Filled 2011-05-17: qty 1

## 2011-05-17 MED ORDER — FENTANYL CITRATE 0.05 MG/ML IJ SOLN
INTRAMUSCULAR | Status: AC
  Filled 2011-05-17: qty 4

## 2011-05-17 MED ORDER — FENTANYL CITRATE 0.05 MG/ML IJ SOLN
INTRAMUSCULAR | Status: AC | PRN
  Administered 2011-05-17: 100 ug via INTRAVENOUS

## 2011-05-17 MED ORDER — CEFAZOLIN SODIUM 1-5 GM-% IV SOLN
INTRAVENOUS | Status: AC
  Administered 2011-05-17: 11:00:00 via INTRAVENOUS
  Filled 2011-05-17: qty 50

## 2011-05-17 NOTE — H&P (Signed)
Jose [name] is an 78 y.o. male.   Chief Complaint: dysphagia HPI: Patient with history of base of tongue carcinoma and dysphagia presents today for elective percutaneous gastrostomy tube placement.  Past Medical History  Diagnosis Date  . Blood transfusion     2006 had 4 units  . Arthritis     knees,back,shoulders  . Anxiety   . Right ureteral stone S/P URETERAL STENT 01-24-11  AND ESWL  03-01-11  . History of acute renal failure 01-23-11    DUE TO HYDRONEPHROSIS AND RIGHT STONE  . Normal cardiac stress test 09-25-2007  . Glaucoma   . Cataract immature   . Diverticulosis of colon   . History of GI diverticular bleed   . Hyperlipemia   . Impaired hearing NOT WEARING HIS BILATERL AIDS  . Urgency of urination     DUE TO URETERAL STONE AND STENT  . Frequency of urination   . Insomnia   . Anemia   . Chronic kidney disease     kidney stones  . GERD (gastroesophageal reflux disease)   . Hernia     inguinal  . Headache     migraines  . Glaucoma   . Hearing impairment     bilateral  . Trigeminal neuralgia   . Hypertension   . Male breast cancer 1988    S/P RIGHT MASTECTOMY W/ NODE DISSECTION AND CHEMO  . 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 Skin cancer 2002    melanoma- back    Past Surgical History  Procedure Date  . Cystoscopy w/ ureteral stent placement 01/24/2011    Procedure: CYSTOSCOPY WITH RETROGRADE PYELOGRAM/URETERAL STENT PLACEMENT;  Surgeon: Antony Haste, MD;  Location: [facility];  Service: Urology;  Laterality: Right;  . Right leg surg for fx AGE 10    patient states left thigh  . Craniectomy suboccipital for exploration / decompression cranial nerves 2003    5TH CRANIAL NERVE DECOMPRESSION  . Simple mastectomy 1988    MALE--- RIGHT BREAST W/ NODE DISSECTION  . Melanoma excision 10 YRS AGO    BACK AREA  . Extracorporeal shock wave lithotripsy 03-01-11    RIGHT  . Ureteroscopy 03/26/2011    Procedure: URETEROSCOPY;  Surgeon: Antony Haste, MD;   Location: [facility];  Service: Urology;  Laterality: Right;  RIGHT URETEROSCOPY, HOLMIUM LASER AND STENT C-ARM HOLMIUM LASER  . Examination under anesthesia 03/26/2011    Procedure: EXAM UNDER ANESTHESIA;  Surgeon: Antony Haste, MD;  Location: [facility];  Service: Urology;  Laterality: N/A;  . Lymph node biopsy 04/21/2011    Procedure: LYMPH NODE BIOPSY;  Surgeon: Susy Frizzle, MD;  Location: [facility];  Service: ENT;  Laterality: Right;    Family History  Problem Relation Age of Onset  . Heart disease Mother   . Heart disease Father   . Cancer Sister     "perineum"  . Cancer Paternal Grandmother     stomach   Social History:  reports that he quit smoking about 30 years ago. His smoking use included Cigars and Cigarettes. He has a 7.5 pack-year smoking history. He quit smokeless tobacco use about 30 years ago. He reports that he does not drink alcohol or use illicit drugs.  Allergies:  Allergies  Allergen Reactions  . Hydromorphone Other (See Comments)    phlebitis  . Morphine And Related Other (See Comments)    phlebitis  . Codeine Other (See Comments)    Dizzy, mental status changes  Medications Prior to Admission  Medication Sig Dispense Refill  . amLODipine (NORVASC) 10 MG tablet Take 10 mg by mouth every morning.       . brimonidine (ALPHAGAN) 0.2 % ophthalmic solution Place 1 drop into both eyes 2 (two) times daily.  5 mL    . gabapentin (NEURONTIN) 300 MG capsule Take 600 mg by mouth 2 (two) times daily before lunch and supper.       Marland Kitchen LORazepam (ATIVAN) 0.5 MG tablet Take 0.5 mg by mouth 2 (two) times daily as needed. ANXIETY       . Multiple Vitamins-Minerals (MULTIVITAMIN WITH MINERALS) tablet Take 1 tablet by mouth daily.       Marland Kitchen oxyCODONE (OXYCONTIN) 20 MG 12 hr tablet Take 20 mg by mouth every 12 (twelve) hours as needed. For pain      . oxymetazoline (AFRIN) 0.05 % nasal spray Place 1 spray into the nose 2 (two) times  daily as needed. ALLERGIES       . traMADol (ULTRAM) 50 MG tablet Take 50 mg by mouth daily as needed. Maximum dose= 8 tablets per day For pain      . Travoprost, BAK Free, (TRAVATAMN) 0.004 % SOLN ophthalmic solution Place 1 drop into both eyes at bedtime.      . vitamin C (ASCORBIC ACID) 500 MG tablet Take 1,000 mg by mouth daily.       . vitamin E 400 UNIT capsule Take 400 Units by mouth daily.        Medications Prior to Admission  Medication Dose Route Frequency Provider Last Rate Last Dose  . 0.9 %  sodium chloride infusion   Intravenous Once Robet Leu, PA      . ceFAZolin (ANCEF) 1-5 GM-% IVPB           . ceFAZolin (ANCEF) IVPB 1 g/50 mL premix  1 g Intravenous Once Robet Leu, PA      . fentaNYL (SUBLIMAZE) 0.05 MG/ML injection           . midazolam (VERSED) 2 MG/2ML injection             Results for orders placed during the hospital encounter of 05/17/11 (from the past 48 hour(s))  CBC     Status: Normal   Collection Time   05/17/11  9:15 AM      Component Value Range Comment   WBC 8.6  4.0 - 10.5 (K/uL)    RBC 4.69  4.22 - 5.81 (MIL/uL)    Hemoglobin 14.2  13.0 - 17.0 (g/dL)    HCT 82.9  56.2 - 13.0 (%)    MCV 86.6  78.0 - 100.0 (fL)    MCH 30.3  26.0 - 34.0 (pg)    MCHC 35.0  30.0 - 36.0 (g/dL)    RDW 86.5  78.4 - 69.6 (%)    Platelets 266  150 - 400 (K/uL)    Results for orders placed during the hospital encounter of 05/17/11  APTT      Component Value Range   aPTT 33  24 - 37 (seconds)  BASIC METABOLIC PANEL      Component Value Range   Sodium 137  135 - 145 (mEq/L)   Potassium 3.8  3.5 - 5.1 (mEq/L)   Chloride 103  96 - 112 (mEq/L)   CO2 28  19 - 32 (mEq/L)   Glucose, Bld 123 (*) 70 - 99 (mg/dL)   BUN 19  6 - 23 (mg/dL)  Creatinine, Ser 1.51 (*) 0.50 - 1.35 (mg/dL)   Calcium 9.2  8.4 - 08.6 (mg/dL)   GFR calc non Af Amer 42 (*) >90 (mL/min)   GFR calc Af Amer 48 (*) >90 (mL/min)  CBC      Component Value Range   WBC 8.6  4.0 - 10.5 (K/uL)    RBC 4.69  4.22 - 5.81 (MIL/uL)   Hemoglobin 14.2  13.0 - 17.0 (g/dL)   HCT 57.8  46.9 - 62.9 (%)   MCV 86.6  78.0 - 100.0 (fL)   MCH 30.3  26.0 - 34.0 (pg)   MCHC 35.0  30.0 - 36.0 (g/dL)   RDW 52.8  41.3 - 24.4 (%)   Platelets 266  150 - 400 (K/uL)  PROTIME-INR      Component Value Range   Prothrombin Time 13.1  11.6 - 15.2 (seconds)   INR 0.97  0.00 - 1.49     Review of Systems  Constitutional: Negative for fever and chills.  HENT:       Dysphagia  Respiratory: Negative for cough and shortness of breath.   Cardiovascular: Negative for chest pain.  Gastrointestinal: Negative for nausea, vomiting and abdominal pain.  Neurological: Negative for headaches.  Endo/Heme/Allergies: Does not bruise/bleed easily.    Blood pressure 140/89, pulse 55, temperature 97.4 F (36.3 C), temperature source Oral, resp. rate 16, height 6' (1.829 m), weight 175 lb (79.379 kg), SpO2 97.00%. Physical Exam  Constitutional: He is oriented to person, place, and time. He appears well-developed and well-nourished.  Cardiovascular: Normal rate and regular rhythm.   Respiratory: Effort normal and breath sounds normal.  GI: Soft. Bowel sounds are normal. He exhibits no distension. There is no tenderness.  Musculoskeletal: Normal range of motion. He exhibits no edema.  Neurological: He is alert and oriented to person, place, and time.     Assessment/Plan Patient with history of dysphagia, base of tongue carcinoma; plan is for percutaneous gastrostomy tube placement.  Rodricus Candelaria,D KEVIN 05/17/2011, 9:56 AM

## 2011-05-17 NOTE — Procedures (Signed)
Successful fluoroscopic guided insertion of gastrostomy tube without immediate post procedural complicatoin.   The gastrostomy tube may be used immediately for medications.  Otherwise, place gastrostomy tube to low wall suction for 24 hrs.  Tube feeds may be initiated in 24 hours as per the primary team.   

## 2011-05-18 ENCOUNTER — Ambulatory Visit (HOSPITAL_COMMUNITY): Admission: RE | Admit: 2011-05-18 | Payer: Medicare Other | Source: Ambulatory Visit

## 2011-05-18 ENCOUNTER — Other Ambulatory Visit (HOSPITAL_COMMUNITY): Payer: Self-pay | Admitting: Oncology

## 2011-05-18 ENCOUNTER — Other Ambulatory Visit (HOSPITAL_COMMUNITY): Payer: Self-pay

## 2011-05-18 DIAGNOSIS — C021 Malignant neoplasm of border of tongue: Secondary | ICD-10-CM

## 2011-05-19 ENCOUNTER — Ambulatory Visit (HOSPITAL_COMMUNITY)
Admission: RE | Admit: 2011-05-19 | Discharge: 2011-05-19 | Disposition: A | Discharge status: Home / Self Care | Payer: Medicare Other | Source: Ambulatory Visit | Attending: Oncology | Admitting: Oncology

## 2011-05-19 DIAGNOSIS — K219 Gastro-esophageal reflux disease without esophagitis: Secondary | ICD-10-CM | POA: Insufficient documentation

## 2011-05-19 DIAGNOSIS — Z853 Personal history of malignant neoplasm of breast: Secondary | ICD-10-CM | POA: Insufficient documentation

## 2011-05-19 DIAGNOSIS — C021 Malignant neoplasm of border of tongue: Secondary | ICD-10-CM

## 2011-05-19 DIAGNOSIS — R1313 Dysphagia, pharyngeal phase: Secondary | ICD-10-CM | POA: Insufficient documentation

## 2011-05-19 DIAGNOSIS — I10 Essential (primary) hypertension: Secondary | ICD-10-CM | POA: Insufficient documentation

## 2011-05-19 DIAGNOSIS — Z85820 Personal history of malignant melanoma of skin: Secondary | ICD-10-CM | POA: Insufficient documentation

## 2011-05-19 DIAGNOSIS — E785 Hyperlipidemia, unspecified: Secondary | ICD-10-CM | POA: Insufficient documentation

## 2011-05-19 NOTE — Procedures (Signed)
Modified Barium Swallow Procedure Note Patient Details  Name: Jose [name] MRN: [id number] Date of Birth: 06/24/1927  Today's Date: 05/19/2011 Time:1005  - 1121    Past Medical History:  Past Medical History  Diagnosis Date  . Blood transfusion     2006 had 4 units  . Arthritis     knees,back,shoulders  . Anxiety   . Right ureteral stone S/P URETERAL STENT 01-24-11  AND ESWL  03-01-11  . History of acute renal failure 01-23-11    DUE TO HYDRONEPHROSIS AND RIGHT STONE  . Normal cardiac stress test 09-25-2007  . Glaucoma   . Cataract immature   . Diverticulosis of colon   . History of GI diverticular bleed   . Hyperlipemia   . Impaired hearing NOT WEARING HIS BILATERL AIDS  . Urgency of urination     DUE TO URETERAL STONE AND STENT  . Frequency of urination   . Insomnia   . Anemia   . Chronic kidney disease     kidney stones  . GERD (gastroesophageal reflux disease)   . Hernia     inguinal  . Headache     migraines  . Glaucoma   . Hearing impairment     bilateral  . Trigeminal neuralgia   . Hypertension   . Male breast cancer 1988    S/P RIGHT MASTECTOMY W/ NODE DISSECTION AND CHEMO  . 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 Skin cancer 2002    melanoma- back   Past Surgical History:  Past Surgical History  Procedure Date  . Cystoscopy w/ ureteral stent placement 01/24/2011    Procedure: CYSTOSCOPY WITH RETROGRADE PYELOGRAM/URETERAL STENT PLACEMENT;  Surgeon: Antony Haste, MD;  Location: [facility];  Service: Urology;  Laterality: Right;  . Right leg surg for fx AGE 55    patient states left thigh  . Craniectomy suboccipital for exploration / decompression cranial nerves 2003    5TH CRANIAL NERVE DECOMPRESSION  . Simple mastectomy 1988    MALE--- RIGHT BREAST W/ NODE DISSECTION  . Melanoma excision 10 YRS AGO    BACK AREA  . Extracorporeal shock wave lithotripsy 03-01-11    RIGHT  . Ureteroscopy 03/26/2011    Procedure: URETEROSCOPY;  Surgeon: Antony Haste, MD;  Location: [facility];  Service: Urology;  Laterality: Right;  RIGHT URETEROSCOPY, HOLMIUM LASER AND STENT C-ARM HOLMIUM LASER  . Examination under anesthesia 03/26/2011    Procedure: EXAM UNDER ANESTHESIA;  Surgeon: Antony Haste, MD;  Location: [facility];  Service: Urology;  Laterality: N/A;  . Lymph node biopsy 04/21/2011    Procedure: LYMPH NODE BIOPSY;  Surgeon: Susy Frizzle, MD;  Location: [facility];  Service: ENT;  Laterality: Right;   HPI:  78 yo male with recent diagnosis of lingual tonsil and tongue base/valleculae right, right palatine tonsil squamous cell carcinoma.  Pt is s/p laryngoscopy and biospy by Dr Pollyann Kennedy 04/21/11, referred by Dr Mitzi Hansen for MBS.  PMH + for smoking (stopped in 1982) after smoking since he was a teenager, anxiety, blood tranfusion, GI bleed, hyperlipidemia, diverticulosis, acute renal failure due to hydronephrosis and right stone, GERD for which he has self tx with OTC tums, male breast ca s/p mastectomoy with node dissection and chemo, skin cancer 2002, migraines, anemia, insomnia.  Pt has yet to have dentition removed in prep for radiation, reportedly due to finances.  Pt reports xerostomia x6 months, he presented with biotene mouth care product in his pocket.  Pain  with dry swallowing also reported, but not with consumption of po.  Occasional choking on liquids more than solids also reported with progression in the last few months.  No pulmonary infections or heimlich manuever indicated, but weight loss acknowledged with appetite being "so-so".  Pt did receive PEG 05/17/11 and has some pain from this healing.  Due to pt's trigeminal neuralgia, he uses a straw when drinking to decrease pain.  Pt takes his medications with applejuice, tucking his chin to help it to clear.       Recommendation/Prognosis  Clinical Impression Dysphagia Diagnosis: Mild oral phase dysphagia;Mild pharyngeal phase dysphagia Clinical  impression: Pt presents with mild oropharygneal dysphagia WITHOUT aspiration or penetration of any consistency tested and overall functional swallow.  Oral dysphagia may be due to pt's xerostomia and compensatory given pt report of having to "wallow food/drink around before swallow".  Pharyngeal deficits were motor and sensory resulting in mild stasis, suspected due to mass obliterating portion of vallecular space.  Pt compensated well by conducting dry swallows (per cue) to clear.  SLP provided barium tablet with pudding due to concerns for aspiration.  Pt takes small bites/sips and consumes po slowly, (is always the last one to finish eating per pt/wife), which will enhance his airway protection.  Recommend pt continue soft diet with thin liquid using compensatory strategies and monitoring diet tolerance as he proceeds into tx.  Encouraged pt to continue po as much as able with tx pH neutral status of water making it safest liquid to consume.  SLP also provided pt with list of diet modifications, xerostomia tips, neck stretching/massage and general reflux precautions for his use.  Of note, pt observed to belch frequently after MBS and acknowledged occasional reflux with sour taste, advised pt to inform MD.  Thanks for this referral.    Swallow Evaluation Recommendations Solid Consistency: Dysphagia 3 (Mechanical soft) Liquid Consistency: Thin Liquid Administration via: Straw (water with meals if note incr coughing/worse problem swallow) Medication Administration: Other (Comment) (start with water, whole with pudding, follow w/water) Supervision: Patient able to self feed Compensations: Slow rate;Small sips/bites;Follow solids with liquid (intermittent dry swallow to clear stasis pt doesn't sense) Oral Care Recommendations: Oral care QID   Individuals Consulted Consulted and Agree with Results and Recommendations: Patient;Family member/caregiver Family Member Consulted: spouse Report Sent to :  Referring physician  General:  Date of Onset: 05/19/11 HPI: 78 yo male with recent diagnosis of lingual tonsil and tongue base/valleculae right, right palatine tonsil squamous cell carcinoma.  Pt is s/p laryngoscopy and biospy by Dr Pollyann Kennedy 04/21/11, referred by Dr Mitzi Hansen for MBS.  PMH + for smoking (stopped in 1982) after smoking since he was a teenager, anxiety, blood tranfusion, GI bleed, hyperlipidemia, diverticulosis, acute renal failure due to hydronephrosis and right stone, GERD for which he has self tx with OTC tums, male breast ca s/p mastectomoy with node dissection and chemo, skin cancer 2002, migraines, anemia, insomnia.  Pt has yet to have dentition removed in prep for radiation, reportedly due to finances.  Pt reports xerostomia x6 months, he presented with biotene mouth care product in his pocket.  Pain with dry swallowing also reported, but not with consumption of po.  Occasional choking on liquids more than solids also reported with progression in the last few months.  No pulmonary infections or heimlich manuever indicated, but weight loss acknowledged with appetite being "so-so".  Pt did receive PEG 05/17/11 and has some pain from this healing.  Due to pt's trigeminal neuralgia,  he uses a straw when drinking to decrease pain.  Pt takes his medications with applejuice, tucking his chin to help it to clear.   Type of Study: Initial MBS Diet Prior to this Study: Dysphagia 3 (soft);Thin liquids Respiratory Status: Room air Behavior/Cognition: Alert;Cooperative;Pleasant mood Oral Cavity - Dentition: Poor condition;Dentures, top (top partial-pt to have dentition removed,not yet d/t finance) Oral Motor / Sensory Function: Impaired motor (h/o trigeminal neuralgia s/p CN 5 decompression 03) Oral impairment: Right facial;Left labial Vision: Functional for self-feeding Patient Positioning: Upright in chair Baseline Vocal Quality: Clear Volitional Cough: Strong Volitional Swallow: Able to elicit  (difficult d/t xerostomia) Anatomy: Other (Comment) (appearance of mass at tongue base- known tongue base ca) Pharyngeal Secretions: Not observed secondary MBS  Reason for Referral:  New dx of oropharyngeal cancer  Oral Phase Oral Preparation/Oral Phase Oral Phase: Impaired Oral - Nectar Oral - Nectar Straw: Delayed oral transit;Lingual/palatal residue;Other (Comment) (dry swallow) Oral - Thin Oral - Thin Straw: Delayed oral transit;Lingual/palatal residue;Other (Comment) (dry swallow) Oral - Solids Oral - Puree: Delayed oral transit;Other (Comment);Lingual/palatal residue (dry and liquid swallow) Oral - Regular: Delayed oral transit;Lingual/palatal residue;Other (Comment) (dry and liquid swallow) Oral - Pill: Delayed oral transit Oral Phase - Comment Oral Phase - Comment: Pt reports he masticates solids until they are liquified, as has done this for 6 mos to one year due to dentition issues.   Pharyngeal Phase  Pharyngeal Phase Pharyngeal Phase: Impaired Pharyngeal - Nectar Pharyngeal - Nectar Straw: Delayed swallow initiation;Premature spillage to valleculae;Pharyngeal residue - valleculae Pharyngeal - Thin Pharyngeal - Thin Straw: Pharyngeal residue - valleculae Pharyngeal - Solids Pharyngeal - Puree: Pharyngeal residue - valleculae Pharyngeal - Regular: Pharyngeal residue - valleculae Pharyngeal Phase - Comment Pharyngeal Comment: dry swallow and liquid swallow aided to clear residuals, pt does not sense mild stasis at oropharyngeal tongue base Cervical Esophageal Phase  Cervical Esophageal Phase Cervical Esophageal Phase: Impaired Cervical Esophageal Phase - Nectar Nectar Straw: Prominent cricopharyngeal segment;Esophageal backflow into the pharynx Cervical Esophageal Phase - Thin Thin Straw: Prominent cricopharyngeal segment;Esophageal backflow into the pharynx Cervical Esophageal Phase - Solids Puree: Prominent cricopharyngeal segment Regular: Prominent cricopharyngeal  segment Pill: Prominent cricopharyngeal segment Cervical Esophageal Phase - Comment Cervical Esophageal Comment: minimal esophageal backflow of liquids from cervical esophagus, did not enter larynx and cleared with dry swallows  Donavan Burnet, MS [facility] SLP [phone]

## 2011-05-20 ENCOUNTER — Encounter: Payer: Self-pay | Admitting: Nutrition

## 2011-05-20 ENCOUNTER — Other Ambulatory Visit: Payer: Medicare Other | Admitting: Lab

## 2011-05-20 ENCOUNTER — Ambulatory Visit (HOSPITAL_BASED_OUTPATIENT_CLINIC_OR_DEPARTMENT_OTHER): Payer: Medicare Other | Admitting: Oncology

## 2011-05-20 ENCOUNTER — Other Ambulatory Visit (HOSPITAL_COMMUNITY): Payer: Self-pay | Admitting: Family Medicine

## 2011-05-20 ENCOUNTER — Encounter: Payer: Self-pay | Admitting: Oncology

## 2011-05-20 VITALS — BP 151/76 | HR 62 | Temp 97.0°F | Ht 72.0 in | Wt 180.0 lb

## 2011-05-20 DIAGNOSIS — C801 Malignant (primary) neoplasm, unspecified: Secondary | ICD-10-CM

## 2011-05-20 DIAGNOSIS — C099 Malignant neoplasm of tonsil, unspecified: Secondary | ICD-10-CM

## 2011-05-20 DIAGNOSIS — C01 Malignant neoplasm of base of tongue: Secondary | ICD-10-CM

## 2011-05-20 MED ORDER — PROCHLORPERAZINE MALEATE 10 MG PO TABS: 10.0000 mg | ORAL_TABLET | Freq: Four times a day (QID) | ORAL | Status: DC | PRN

## 2011-05-20 MED ORDER — ONDANSETRON HCL 8 MG PO TABS: ORAL_TABLET | ORAL | Status: DC

## 2011-05-20 NOTE — Progress Notes (Signed)
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   CURRENT THERAPY:  Due to start concurrent chemoradiation.   INTERVAL HISTORY: Jose [name] 78 y.o. male returns to clinic with his wife, daughter, and grand daughter to finalize his treatment option and go over the staging PET scan.  He reports some discomfort at the PEG tube site which was placed last week.  He starts to have mouth sore, some dysphagia, odynophagia.  However, his weight has slightly increased.  He still has bilateral neck nodes without tenderness.   Patient denies fatigue, headache, visual changes, confusion, drenching night sweats, nausea vomiting, jaundice, chest pain, palpitation, shortness of breath, dyspnea on exertion, productive cough, gum bleeding, epistaxis, hematemesis, hemoptysis, abdominal pain, abdominal swelling, early satiety, melena, hematochezia, hematuria, skin rash, spontaneous bleeding, joint swelling, joint pain, heat or cold intolerance, bowel bladder incontinence, back pain, focal motor weakness, paresthesia, depression, suicidal or homocidal ideation, feeling hopelessness.   Past Medical History  Diagnosis Date  . Blood transfusion     2006 had 4 units  . Arthritis     knees,back,shoulders  . Anxiety   . Right ureteral stone S/P URETERAL STENT 01-24-11  AND ESWL  03-01-11  . History of acute renal failure 01-23-11    DUE TO HYDRONEPHROSIS AND RIGHT STONE  . Normal cardiac stress test 09-25-2007  . Glaucoma   . Cataract immature   . Diverticulosis of colon   . History of GI diverticular bleed   . Hyperlipemia   . Impaired hearing NOT WEARING HIS BILATERL AIDS  . Urgency of urination     DUE TO URETERAL STONE AND STENT  . Frequency of urination   . Insomnia   . Anemia   . Chronic kidney disease      kidney stones  . GERD (gastroesophageal reflux disease)   . Hernia     inguinal  . Headache     migraines  . Glaucoma   . Hearing impairment     bilateral  . Trigeminal neuralgia   . Hypertension   . Male breast cancer 1988    S/P RIGHT MASTECTOMY W/ NODE DISSECTION AND CHEMO  . 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 Skin cancer 2002    melanoma- back    Past Surgical History  Procedure Date  . Cystoscopy w/ ureteral stent placement 01/24/2011    Procedure: CYSTOSCOPY WITH RETROGRADE PYELOGRAM/URETERAL STENT PLACEMENT;  Surgeon: Antony Haste, MD;  Location: [facility];  Service: Urology;  Laterality: Right;  . Right leg surg for fx AGE 50    patient states left thigh  . Craniectomy suboccipital for exploration / decompression cranial nerves 2003    5TH CRANIAL NERVE DECOMPRESSION  . Simple mastectomy 1988    MALE--- RIGHT BREAST W/ NODE DISSECTION  . Melanoma excision 10 YRS AGO    BACK AREA  . Extracorporeal shock wave lithotripsy 03-01-11    RIGHT  . Ureteroscopy 03/26/2011    Procedure: URETEROSCOPY;  Surgeon: Antony Haste, MD;  Location: [facility];  Service: Urology;  Laterality: Right;  RIGHT URETEROSCOPY, HOLMIUM LASER AND STENT C-ARM HOLMIUM LASER  . Examination under anesthesia 03/26/2011    Procedure: EXAM UNDER ANESTHESIA;  Surgeon: Antony Haste, MD;  Location: [facility];  Service: Urology;  Laterality: N/A;  . Lymph  node biopsy 04/21/2011    Procedure: LYMPH NODE BIOPSY;  Surgeon: Susy Frizzle, MD;  Location: [facility];  Service: ENT;  Laterality: Right;    Current Outpatient Prescriptions  Medication Sig Dispense Refill  . amLODipine (NORVASC) 10 MG tablet Take 10 mg by mouth every morning.       . brimonidine (ALPHAGAN) 0.2 % ophthalmic solution Place 1 drop into both eyes 2 (two) times daily.  5 mL    . gabapentin (NEURONTIN) 300 MG capsule Take 600 mg by mouth 2 (two) times daily before lunch and supper.        Marland Kitchen LORazepam (ATIVAN) 0.5 MG tablet Take 0.5 mg by mouth 2 (two) times daily as needed. ANXIETY       . Multiple Vitamins-Minerals (MULTIVITAMIN WITH MINERALS) tablet Take 1 tablet by mouth daily.       . ondansetron (ZOFRAN) 8 MG tablet Take 1 tab two times a day starting the day after chemo for 3 days. Then take 1 tab two times a day as needed for nausea or vomiting.  30 tablet  1  . oxyCODONE (OXYCONTIN) 20 MG 12 hr tablet Take 20 mg by mouth every 12 (twelve) hours as needed. For pain      . oxymetazoline (AFRIN) 0.05 % nasal spray Place 1 spray into the nose 2 (two) times daily as needed. ALLERGIES       . prochlorperazine (COMPAZINE) 10 MG tablet Take 1 tablet (10 mg total) by mouth every 6 (six) hours as needed (Nausea or vomiting).  30 tablet  1  . traMADol (ULTRAM) 50 MG tablet Take 50 mg by mouth daily as needed. Maximum dose= 8 tablets per day For pain      . Travoprost, BAK Free, (TRAVATAMN) 0.004 % SOLN ophthalmic solution Place 1 drop into both eyes at bedtime.      . vitamin C (ASCORBIC ACID) 500 MG tablet Take 1,000 mg by mouth daily.       . vitamin E 400 UNIT capsule Take 400 Units by mouth daily.         ALLERGIES:  is allergic to hydromorphone; morphine and related; and codeine.  REVIEW OF SYSTEMS:  The rest of the 14-point review of system was negative.   Filed Vitals:   05/20/11 1448  BP: 151/76  Pulse: 62  Temp: 97 F (36.1 C)   Wt Readings from Last 3 Encounters:  05/20/11 180 lb (81.647 kg)  05/17/11 175 lb (79.379 kg)  05/06/11 179 lb 4.8 oz (81.33 kg)   ECOG Performance status: 1  PHYSICAL EXAMINATION:   General: well-nourished in no acute distress. Eyes: no scleral icterus. ENT: There were no oropharyngeal lesions. Neck was without thyromegaly. Lymphatics: Positive for a 3cm left cervical and a 2cm right cervical level III nodes. Right cervical node excision wound has healed without erythema, purulent discharge. There was no supraclavicular or  axillary adenopathy. Respiratory: lungs were clear bilaterally without wheezing or crackles. Cardiovascular: Regular rate and rhythm, S1/S2, without murmur, rub or gallop. There was no pedal edema. GI: abdomen was soft, flat, nontender, nondistended, without organomegaly.  PEG tube was dry, clean, intact.  Muscoloskeletal: no spinal tenderness of palpation of vertebral spine. Skin exam was without echymosis, petichae. Neuro exam was nonfocal. Patient was able to get on and off exam table without assistance. Gait was normal. Patient was alerted and oriented. Attention was good. Language was appropriate. Mood was normal without depression. Speech was not pressured. Thought content was not  tangential.    LABORATORY/RADIOLOGY DATA:  Nm Pet Image Initial (pi) Skull Base To Thigh:  I personally reviewed the following PET scan and showed the patient and his family members the images.  In brief, there was base of tongue/tonsilar uptake mainly in the right but also extension to the left.  There was bilateral cervical node uptake.  There was a small uptake in the right middle lobe but no convincing evidence of distant met spread.   05/11/2011  *RADIOLOGY REPORT*  Clinical Data:  Initial treatment strategy for tongue squamous cell carcinoma.  Previous history breast cancer.  NUCLEAR MEDICINE PET CT INITIAL (PI) SKULL BASE TO THIGH  Technique:  16.3 mCi F-18 FDG was injected intravenously via the left hand.  Full-ring PET imaging was performed from the skull base through the mid-thighs 63  minutes after injection.  CT data was obtained and used for attenuation correction and anatomic localization only.  (This was not acquired as a diagnostic CT examination.)  Fasting Blood Glucose:  97  Patient Weight:  175 pounds.  Comparison:  Neck CT on 04/29/2011  Findings: Ill-defined soft tissue mass is seen which involves the lingular tonsil/base of tongue, the right palatine tonsil, and valleculae, right side greater than left.   This shows diffuse hypermetabolic activity, with a maximum SUV of 22.0.  Hypermetabolic lymphadenopathy is seen in the upper internal jugular chains at level II A bilaterally, consistent with metastatic disease.  No other sites of metastatic adenopathy are identified within the neck.  No hypermetabolic lymphadenopathy identified within the thorax. Patchy airspace disease is seen in the right middle lobe which shows hypermetabolic activity and is suspicious for pneumonia or other inflammatory process.  No suspicious pulmonary nodules or masses are identified.  No hypermetabolic masses or lymphadenopathy are identified within the abdomen or pelvis.  No suspicious hypermetabolic bone lesions identified.  IMPRESSION:  1.  Hypermetabolic mass involving the lingular tonsil, valleculae, and right palatine tonsil, consistent with carcinoma. 2.  Bilateral upper internal jugular chain lymphadenopathy, consistent with metastatic disease. 3.  No evidence of distant metastatic disease. 4.  Hypermetabolic infiltrate in the right middle lobe, suspicious for pneumonia. Recommend clinical correlation and chest radiographic followup to confirm resolution.  Original Report Authenticated By: Danae Orleans, M.D.    ASSESSMENT AND PLAN:   1.  HLP:  He is on diet control. 2.  HTN:  He is on Amlodipine.  3.  Trigeminal neuralgia:  He is on neurontin and tramadol. 4.  History of smoking:  He quit in 1982.  5.  History of kidney stone in 2012 s/p stent. 6.  History of male breast cancer in 1989:   S/p mastectomy; s/p adjuvant chemoradiation; s/p 5 years of adjuvant Tamoxifen in distant past.  He is in remission from this cancer. 7.  Chronic Kidney disease. 8.  Right lung nodule uptake:  Most likely local inflammatory condition. He does not have fever, hemoptysis, pleurisy, cough or other signs of pneumonia.   I have low clinical suspicion for lung met from his oropharynx cancer given the isolating lesion.  9.  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    Staging and Prognosis:  He does not have distant met on the PET scan.  However, he is stage IVB and the chance of cure is no more than 50%.    Treatment options:  I went over treatment options with  patient and family members again today.  No role for primary resection given location and extend of disease.  He is a candidate for concurrent definitive chemoradiation.  Last week, he was on the fence about treatment or not.  I strongly urged him again today with without at least local control with radiation, he will have severe morbidity in the near future.  He is also at high risk of developing distant met given the clinical staging.  I recommended that chemotherapy given concurrently with radiation can improve response rate and overall survival.  I went over the 3 options for chemo again today with patient and his relatives.  He is not a candidate for Cisplatin due to his age, hearing loss, and chronic renal insufficiency. Carboplatin/Taxol is a good option given his high risk features.  However, given his age, he is at high risk of mucositis and other complications.  Erbitux is an option which he may tolerate well; but with stage IVB, it may not be enough for systemic control.  I went over with patient and family members side effects of combination regimen of Carboplatin andTaxol which include but not limited to mucositis, cytopenia, infection, infusion reaction, neuropathy, myalgia, arthralgia, pneumonitis. I discussed with patient side effects of Erbitux which include but not limited to skin rash, electrolytes abnormality, infusion reaction, diarrhea.  Mr. Autry decided to proceed with weekly Carboplatin/Taxol.  If he develops severe side effects, he will drop chemo and just finish with radiation alone.  He had canceled his appointment for dental extraction last week due to  inability to come up with cash for copay.  I referred him back to Dr. Kristin Bruins today for further discussion.    In preparation for chemo, he was given Rx for Compazine/Zofran prn.  He has appointment to see chemo class within 2 wks. I will see him in about 3-4 wks to tentatively start concurrent chemoradiation.  He has already a PEG tube placement.  He was referred already to Speech path.  He will see nutrition soon when he starts therapy.    The length of time of the face-to-face encounter was 25 minutes. More than 50% of time was spent counseling and coordination of care.

## 2011-05-21 ENCOUNTER — Telehealth: Payer: Self-pay | Admitting: *Deleted

## 2011-05-21 ENCOUNTER — Other Ambulatory Visit: Payer: Self-pay | Admitting: Oncology

## 2011-05-21 DIAGNOSIS — C109 Malignant neoplasm of oropharynx, unspecified: Secondary | ICD-10-CM

## 2011-05-21 NOTE — Telephone Encounter (Signed)
Wife left Vm informing pt's appt w/ Dr. Kristin Bruins has been moved to 3/13.  She thinks pt's XRT start date will also need to be moved along w/ pt's chemotherapy dates.   Called [facility] and s/w [place].  She will notify Dr. Mitzi Hansen of above.

## 2011-05-24 ENCOUNTER — Telehealth: Payer: Self-pay | Admitting: Oncology

## 2011-05-24 NOTE — Telephone Encounter (Signed)
Pt had been made aware and will p/u a sch when here for chemo class on 05/26/11   aom

## 2011-05-26 ENCOUNTER — Encounter: Payer: Self-pay | Admitting: *Deleted

## 2011-05-26 ENCOUNTER — Encounter: Payer: Self-pay | Admitting: Radiation Oncology

## 2011-05-26 ENCOUNTER — Ambulatory Visit: Payer: Medicare Other | Admitting: Nutrition

## 2011-05-26 ENCOUNTER — Ambulatory Visit: Admission: RE | Admit: 2011-05-26 | Payer: Medicare Other | Source: Ambulatory Visit | Admitting: Radiation Oncology

## 2011-05-26 ENCOUNTER — Ambulatory Visit: Payer: Medicare Other

## 2011-05-26 ENCOUNTER — Other Ambulatory Visit: Payer: Medicare Other

## 2011-05-26 ENCOUNTER — Ambulatory Visit
Admission: RE | Admit: 2011-05-26 | Discharge: 2011-05-26 | Disposition: A | Discharge status: Home / Self Care | Payer: Medicare Other | Source: Ambulatory Visit | Attending: Radiation Oncology | Admitting: Radiation Oncology

## 2011-05-26 VITALS — BP 132/75 | HR 60 | Temp 98.2°F | Resp 20 | Wt 178.2 lb

## 2011-05-26 DIAGNOSIS — C01 Malignant neoplasm of base of tongue: Secondary | ICD-10-CM

## 2011-05-26 NOTE — Progress Notes (Signed)
CC:   Exie Parody, M.D. Jefry H. Pollyann Kennedy, MD  DIAGNOSIS:  Squamous cell carcinoma of the base of tongue.  NARRATIVE:  [name] comes in today for followup.  He had been scheduled to proceed with a simulation based on when he was going to have some dental work done.  However he indicates that this has been rescheduled for 06/02/2011.  He therefore is having his teeth extracted on this date.  The patient is noting some increased pain as well as some increased swelling in the neck since he was last seen.  ASSESSMENT AND PLAN:  I am concerned about Mr. Cina time frame and have been trying to encourage him to proceed as quickly as possible.  He has had some various concerns about financial issues and this as well as other concerns, I believe, has led to some delay in his care.  He specifically canceled 1 appointment earlier which I think would have been helpful to try to move things along.  I did discuss with Mr. Porreca that I was concerned about some progressive disease prior to beginning treatment.  He is going to chemotherapy class today and I was able to talk with Dr. Gaylyn Rong about this issue.  My concern was based on the current time frame, we are not going to be able to begin his planning for radiation for another couple of weeks.  I wanted to touch base with Dr. Gaylyn Rong to see if there was anything on the chemotherapy side of things that can be done to keep things at bay while Mr. Yasuda continues to percolate through his appointments.  Mr. Clack really is continuing to have a difficult time making treatment decisions and I am not sure if he really understands the urgency of his disease.  I did try to make this clear. Dr. Gaylyn Rong felt however that there really was not much that chemotherapy would be able to do alone given the patient's age and comorbidities as well as his overall functional status.  We therefore will reschedule his simulation for this to be done soon after his teeth are extracted and  I will proceed with treatment planning as quickly as possible.  Dr. Gaylyn Rong does have followup scheduled at this time with Mr. Nastasi and he also was inclined to start chemo as soon as he is able as well.  I spent 10 minutes with Mr. Pepitone today, the majority of which was spent counseling him on his diagnosis and coordinating his care.    ______________________________ Radene Gunning, M.D., Ph.D. JSM/MEDQ  D:  05/26/2011  T:  05/26/2011  Job:  [id number]

## 2011-05-26 NOTE — Progress Notes (Signed)
Pt here prior to teeth extractions scheduled for 06/02/11. Pt states his "throat is sorer and he feels sore around his peg tube". Pt flushing peg tube at this time, still eating meals. C/o fatigue.

## 2011-05-26 NOTE — Assessment & Plan Note (Signed)
Jose [name] is an 78 year old male patient of Dr. Lodema Pilot diagnosed with oropharyngeal cancer.  MEDICAL HISTORY INCLUDES:  Hypercholesterolemia, hyperlipidemia, anxiety, hypertension, diverticulosis, nephrolithiasis, BPH, acute renal failure and male breast cancer.  MEDICATIONS INCLUDE:  Ativan, multivitamin, ascorbic acid and vitamin D.  LABS:  Glucose 123, creatinine of 1.51.  HEIGHT:  6 feet. WEIGHT:  178.2 pounds. USUAL BODY WEIGHT:  195 pounds. BMI:  24.6.  Jose [name] had his G-tube placed February 25th.  He reports it continues to be sore.  He is flushing it with water several times a day.  He has lost about 20 pounds unintentionally.  He is status post an MBS on the 27th were it was recommended that he consume a dysphagia 3 mechanical soft, thin liquid diet.  The patient is scheduled to begin chemotherapy at the end of the month.  In the meantime, he is planning on having some dental work done which will impede his oral intake further.  He is not currently using oral nutrition supplements at this time but he is consuming soft foods.  NUTRITION DIAGNOSIS:  Predicted suboptimal energy intake related to new diagnosis of oropharyngeal cancer and associated treatments as evidenced by the history or presence of this condition for which research shows an increased incidence of suboptimal energy intake.  INTERVENTION:  I have educated Jose [name] and his wife on the importance of small frequent meals with high-calorie, high-protein foods.  I have given him a list of high-protein foods to pick from and encouraged him to be sure to choose moist chopped meats as needed.  I have educated Jose [name] on the importance of using gravy or another liquid to help moisten foods as needed.  We discussed oral nutritional supplements such as Ensure, Boost or Carnation Instant Breakfast and for the patient to begin using between meals.  I briefly educated the patient on strategies for constipation, nausea and taste  changes.  I have provided him with samples of nutritional supplements with fact sheets and contact information for the patient.  MONITORING/EVALUATION (GOALS):  The patient will tolerate oral intake with high-calorie, high-protein foods to minimize weight loss.  I will initiate tube feedings as oral intake decreases.  NEXT VISIT:  Monday, March 25th during chemotherapy.   ______________________________ Zenovia Jarred, RD, LDN Clinical Nutrition Specialist BN/MEDQ  D:  05/26/2011  T:  05/26/2011  Job:  [id number]

## 2011-06-03 ENCOUNTER — Telehealth: Payer: Self-pay | Admitting: *Deleted

## 2011-06-03 NOTE — Telephone Encounter (Signed)
Returned call from pt's wife who states pt "had all his teeth pulled yesterday." Pt has FU w/oral surgeon Dr Jinny Sanders on 06/10/11, appt w/Dr Gaylyn Rong 06/14/11. She is asking when pt will have "mask made for radiation treatment". Informed her pt has this appt on 06/09/11. Wife verbalized understanding.

## 2011-06-09 ENCOUNTER — Ambulatory Visit
Admission: RE | Admit: 2011-06-09 | Discharge: 2011-06-09 | Disposition: A | Discharge status: Home / Self Care | Payer: Medicare Other | Source: Ambulatory Visit | Attending: Radiation Oncology | Admitting: Radiation Oncology

## 2011-06-09 DIAGNOSIS — C01 Malignant neoplasm of base of tongue: Secondary | ICD-10-CM

## 2011-06-09 NOTE — Evaluation (Signed)
Asked to look at G-tube site for 'redness' G-tube placed about 1 mo ago. Has developed some mild hypertrophic granulation tissue, but no drainage or bleeding. Drain otherwise looks good. Recommended frequent flushing and to keep peri-drain area dry. No ointments, creams. Seen with Dr. Cyndia Diver, [facility] 06/09/2011 12:19 PM

## 2011-06-14 ENCOUNTER — Telehealth: Payer: Self-pay | Admitting: Oncology

## 2011-06-14 ENCOUNTER — Encounter: Payer: Self-pay | Admitting: *Deleted

## 2011-06-14 ENCOUNTER — Other Ambulatory Visit: Payer: Medicare Other | Admitting: Lab

## 2011-06-14 ENCOUNTER — Ambulatory Visit (HOSPITAL_BASED_OUTPATIENT_CLINIC_OR_DEPARTMENT_OTHER): Payer: Medicare Other

## 2011-06-14 ENCOUNTER — Ambulatory Visit: Payer: Medicare Other | Admitting: Oncology

## 2011-06-14 ENCOUNTER — Ambulatory Visit: Payer: Medicare Other | Admitting: Nutrition

## 2011-06-14 VITALS — BP 130/70 | HR 70 | Temp 98.0°F | Ht 72.0 in | Wt 174.6 lb

## 2011-06-14 VITALS — BP 137/77 | HR 54 | Temp 97.9°F

## 2011-06-14 DIAGNOSIS — C109 Malignant neoplasm of oropharynx, unspecified: Secondary | ICD-10-CM

## 2011-06-14 DIAGNOSIS — C801 Malignant (primary) neoplasm, unspecified: Secondary | ICD-10-CM

## 2011-06-14 DIAGNOSIS — Z5111 Encounter for antineoplastic chemotherapy: Secondary | ICD-10-CM

## 2011-06-14 LAB — CBC WITH DIFFERENTIAL/PLATELET
EOS%: 1.4 % (ref 0.0–7.0)
Eosinophils Absolute: 0.1 10*3/uL (ref 0.0–0.5)
MCV: 86.4 fL (ref 79.3–98.0)
MONO%: 8.2 % (ref 0.0–14.0)
NEUT#: 5.1 10*3/uL (ref 1.5–6.5)
RBC: 4.48 10*6/uL (ref 4.20–5.82)
RDW: 13.4 % (ref 11.0–14.6)
WBC: 7.3 10*3/uL (ref 4.0–10.3)
lymph#: 1.4 10*3/uL (ref 0.9–3.3)

## 2011-06-14 LAB — COMPREHENSIVE METABOLIC PANEL
AST: 23 U/L (ref 0–37)
Albumin: 3.5 g/dL (ref 3.5–5.2)
Alkaline Phosphatase: 75 U/L (ref 39–117)
Chloride: 103 mEq/L (ref 96–112)
Glucose, Bld: 83 mg/dL (ref 70–99)
Potassium: 4.2 mEq/L (ref 3.5–5.3)
Sodium: 138 mEq/L (ref 135–145)
Total Protein: 7 g/dL (ref 6.0–8.3)

## 2011-06-14 MED ORDER — DEXAMETHASONE SODIUM PHOSPHATE 4 MG/ML IJ SOLN
20.0000 mg | Freq: Once | INTRAMUSCULAR | Status: AC
  Administered 2011-06-14: 20 mg via INTRAVENOUS

## 2011-06-14 MED ORDER — SODIUM CHLORIDE 0.9 % IV SOLN
138.6000 mg | Freq: Once | INTRAVENOUS | Status: AC
  Administered 2011-06-14: 140 mg via INTRAVENOUS
  Filled 2011-06-14: qty 14

## 2011-06-14 MED ORDER — PACLITAXEL CHEMO INJECTION 300 MG/50ML
45.0000 mg/m2 | Freq: Once | INTRAVENOUS | Status: AC
  Administered 2011-06-14: 90 mg via INTRAVENOUS
  Filled 2011-06-14: qty 15

## 2011-06-14 MED ORDER — DIPHENHYDRAMINE HCL 50 MG/ML IJ SOLN
50.0000 mg | Freq: Once | INTRAMUSCULAR | Status: AC
  Administered 2011-06-14: 50 mg via INTRAVENOUS

## 2011-06-14 MED ORDER — ACETAMINOPHEN 325 MG PO TABS
650.0000 mg | ORAL_TABLET | Freq: Once | ORAL | Status: AC
  Administered 2011-06-14: 650 mg via ORAL

## 2011-06-14 MED ORDER — FAMOTIDINE IN NACL 20-0.9 MG/50ML-% IV SOLN
20.0000 mg | Freq: Once | INTRAVENOUS | Status: AC
  Administered 2011-06-14: 20 mg via INTRAVENOUS

## 2011-06-14 MED ORDER — ONDANSETRON 16 MG/50ML IVPB (CHCC)
16.0000 mg | Freq: Once | INTRAVENOUS | Status: AC
  Administered 2011-06-14: 16 mg via INTRAVENOUS

## 2011-06-14 MED ORDER — SODIUM CHLORIDE 0.9 % IV SOLN
Freq: Once | INTRAVENOUS | Status: AC
  Administered 2011-06-14: 10:00:00 via INTRAVENOUS

## 2011-06-14 NOTE — Progress Notes (Signed)
[facility] OFFICE PROGRESS NOTE  Cc:  Rogelia Boga, MD, MD  DIAGNOSIS:  Newly diagnosed cT4 N2c M0 right base of the tongue squamous cell carcinoma with extension to bilateral epiglottis and bilateral cervical neck node.   CURRENT THERAPY: due to start weekly Carboplatin/Taxol today; and daily XRT 06/21/11.   INTERVAL HISTORY: Jose [name] 78 y.o. male returns to clinic with his wife.  He had all his teeth extracted about 10 days ago.  He still has some jaw pain from that.  He has been eating soup and mechanical soft.  He denies fever, purulent discharge.  He has been flushing his PEG tube which has not had any purulent discharge, pain, erythema.  He has mild fatigue; but is independent of all activities of daily living.   Patient denies headache, visual changes, confusion, drenching night sweats, nausea vomiting, jaundice, chest pain, palpitation, shortness of breath, dyspnea on exertion, productive cough, gum bleeding, epistaxis, hematemesis, hemoptysis, abdominal pain, abdominal swelling, early satiety, melena, hematochezia, hematuria, skin rash, spontaneous bleeding, joint swelling, joint pain, heat or cold intolerance, bowel bladder incontinence, back pain, focal motor weakness, paresthesia, depression, suicidal or homocidal ideation, feeling hopelessness.   Past Medical History  Diagnosis Date  . Blood transfusion     2006 had 4 units  . Arthritis     knees,back,shoulders  . Anxiety   . Right ureteral stone S/P URETERAL STENT 01-24-11  AND ESWL  03-01-11  . History of acute renal failure 01-23-11    DUE TO HYDRONEPHROSIS AND RIGHT STONE  . Normal cardiac stress test 09-25-2007  . Glaucoma   . Cataract immature   . Diverticulosis of colon   . History of GI diverticular bleed   . Hyperlipemia   . Impaired hearing NOT WEARING HIS BILATERL AIDS  . Urgency of urination     DUE TO URETERAL STONE AND STENT  . Frequency of urination   . Insomnia   . Anemia    . Chronic kidney disease     kidney stones  . GERD (gastroesophageal reflux disease)   . Hernia     inguinal  . Headache     migraines  . Glaucoma   . Hearing impairment     bilateral  . Trigeminal neuralgia   . Hypertension   . Male breast cancer 1988    S/P RIGHT MASTECTOMY W/ NODE DISSECTION AND CHEMO  . 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 Skin cancer 2002    melanoma- back    Past Surgical History  Procedure Date  . Cystoscopy w/ ureteral stent placement 01/24/2011    Procedure: CYSTOSCOPY WITH RETROGRADE PYELOGRAM/URETERAL STENT PLACEMENT;  Surgeon: Antony Haste, MD;  Location: [facility];  Service: Urology;  Laterality: Right;  . Right leg surg for fx AGE 43    patient states left thigh  . Craniectomy suboccipital for exploration / decompression cranial nerves 2003    5TH CRANIAL NERVE DECOMPRESSION  . Simple mastectomy 1988    MALE--- RIGHT BREAST W/ NODE DISSECTION  . Melanoma excision 10 YRS AGO    BACK AREA  . Extracorporeal shock wave lithotripsy 03-01-11    RIGHT  . Ureteroscopy 03/26/2011    Procedure: URETEROSCOPY;  Surgeon: Antony Haste, MD;  Location: [facility];  Service: Urology;  Laterality: Right;  RIGHT URETEROSCOPY, HOLMIUM LASER AND STENT C-ARM HOLMIUM LASER  . Examination under anesthesia 03/26/2011    Procedure: EXAM UNDER  ANESTHESIA;  Surgeon: Antony Haste, MD;  Location: [facility];  Service: Urology;  Laterality: N/A;  . Lymph node biopsy 04/21/2011    Procedure: LYMPH NODE BIOPSY;  Surgeon: Susy Frizzle, MD;  Location: [facility];  Service: ENT;  Laterality: Right;    Current Outpatient Prescriptions  Medication Sig Dispense Refill  . amLODipine (NORVASC) 10 MG tablet Take 10 mg by mouth every morning.       . brimonidine (ALPHAGAN) 0.2 % ophthalmic solution Place 1 drop into both eyes 2 (two) times daily.  5 mL    . gabapentin (NEURONTIN) 300 MG capsule Take 600 mg by mouth  2 (two) times daily before lunch and supper.       Marland Kitchen LORazepam (ATIVAN) 0.5 MG tablet Take 0.5 mg by mouth 2 (two) times daily as needed. ANXIETY       . Multiple Vitamins-Minerals (MULTIVITAMIN WITH MINERALS) tablet Take 1 tablet by mouth daily.       . ondansetron (ZOFRAN) 8 MG tablet Take 1 tab two times a day starting the day after chemo for 3 days. Then take 1 tab two times a day as needed for nausea or vomiting.  30 tablet  1  . oxyCODONE (OXYCONTIN) 20 MG 12 hr tablet Take 20 mg by mouth every 12 (twelve) hours as needed. For pain      . oxymetazoline (AFRIN) 0.05 % nasal spray Place 1 spray into the nose 2 (two) times daily as needed. ALLERGIES       . prochlorperazine (COMPAZINE) 10 MG tablet Take 1 tablet (10 mg total) by mouth every 6 (six) hours as needed (Nausea or vomiting).  30 tablet  1  . traMADol (ULTRAM) 50 MG tablet Take 50 mg by mouth daily as needed. Maximum dose= 8 tablets per day For pain      . Travoprost, BAK Free, (TRAVATAMN) 0.004 % SOLN ophthalmic solution Place 1 drop into both eyes at bedtime.      . vitamin C (ASCORBIC ACID) 500 MG tablet Take 1,000 mg by mouth daily.       . vitamin E 400 UNIT capsule Take 400 Units by mouth daily.         ALLERGIES:  is allergic to hydromorphone; morphine and related; and codeine.  REVIEW OF SYSTEMS:  The rest of the 14-point review of system was negative.   Filed Vitals:   06/14/11 0839  BP: 130/70  Pulse: 70  Temp: 98 F (36.7 C)   Wt Readings from Last 3 Encounters:  06/14/11 174 lb 9.6 oz (79.198 kg)  05/26/11 178 lb 3.2 oz (80.831 kg)  05/20/11 180 lb (81.647 kg)   ECOG Performance status: 1  PHYSICAL EXAMINATION:   General: well-nourished in no acute distress. Eyes: no scleral icterus. ENT: There was right base of tongue fullness on visual exam.  Neck was without thyromegaly. Lymphatics: Positive for a 3cm left cervical and a 2cm right cervical level III nodes. Right cervical node excision wound has healed  without erythema, purulent discharge. There was no supraclavicular or axillary adenopathy. Respiratory: lungs were clear bilaterally without wheezing or crackles. Cardiovascular: Regular rate and rhythm, S1/S2, without murmur, rub or gallop. There was no pedal edema. GI: abdomen was soft, flat, nontender, nondistended, without organomegaly.  PEG tube was dry, clean, intact.  Muscoloskeletal: no spinal tenderness of palpation of vertebral spine. Skin exam was without echymosis, petichae. Neuro exam was nonfocal. Patient was able to get on and off exam  table without assistance. Gait was normal. Patient was alerted and oriented. Attention was good. Language was appropriate. Mood was normal without depression. Speech was not pressured. Thought content was not tangential.    LABORATORY:   WBC 7.3; Hgb 13.1; Plt 264.    ASSESSMENT AND PLAN:   1.  HLP:  He is on diet control. 2.  HTN:  He is on Amlodipine.  3.  Trigeminal neuralgia:  He is on neurontin and tramadol. 4.  History of smoking:  He quit in 1982.  5.  History of kidney stone in 2012 s/p stent. 6.  History of male breast cancer in 1989:   S/p mastectomy; s/p adjuvant chemoradiation; s/p 5 years of adjuvant Tamoxifen in distant past.  He is in remission from this cancer. 7.  Chronic Kidney disease:  Stable Cr.  8.  Right lung nodule uptake:  Low suspicion for met or lung cancer at this time.  He has no evidence of active pneumonia.  Will pay attention to this on follow up PET.  9.  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    He is ready to start radiation next week to allow enough time for his gum to heal.  However, he has been waiting for a while.  Therefore, on my exam today, the gum has healed enough to start chemotherapy weekly carboplatin/Taxol.  Again, I explained to them that concurrent chemoradiation has the best chance of curing  this cancer.  However, if he tolerates chemo very poorly despite dose reduction, he will continue with just radiation alone.   We again discussed potential side effects of chemo which include but not limited to mucositis, cytopenia, infection, infusion reaction, neuropathy, myalgia, arthralgia, pneumonitis.  He expressed informed understanding and wished to proceed with chemo today.  10.  Mucositis prevention:  I stressed salt/baking soda mouth rinse.  11.  Mild calorie-protein malnutrition:  He sees [facility].  I encouraged him to continue oral intake with mechanical soft.  12.  Nausea/vomiting prophy:  He has compazine/zofran/ativan prn.  13.  Follow up:  Weekly lab/RV and chemo.

## 2011-06-14 NOTE — Progress Notes (Signed)
Approximately 10 minutes into infusion, patient began to complain of increasing difficulty swallowing.  Described throat size as being two fingers width before Taxol began and now feels like one finger.  No complaints of difficulty breathing, shortness of breath.  Vital signs remain stable, Taxol placed on hold.  Kristin,NP notified and in to infusion room to exam patient.  Orders given to continue with Taxol infusion and to notify if patient condition gets worse

## 2011-06-14 NOTE — Progress Notes (Signed)
Jose [name] has lost approximately 3-1/2 pounds.  He is down to 174.6 pounds from 178.2 pounds about 3 weeks ago.  He is status post dental extractions.  He is consuming soft moist foods without difficulty and reports drinking Ensure 2 to 3 times daily.  He is flushing his feeding tube with water, however, has not been putting any nutrition in his tube at this time.  He denies other nutritional issues.  NUTRITION DIAGNOSIS:  Predicted suboptimal energy intake has evolved into inadequate oral intake related to diagnosis of oropharyngeal cancer and associated treatments as evidenced by 2% weight loss in approximately 2-1/2 weeks.  ESTIMATED NUTRITIONAL NEEDS:  2200- 2400 kcal, 110-120 grams protein, 2.3 Liters fluid daily  INTERVENTION:  I have educated Jose [name] and his wife on the importance of continuing high-calorie moist soft foods frequently throughout the day.  I have encouraged him to increase Ensure Plus to t.i.d. to q.i.d. and to continue milkshakes and ice cream as desired.  I have again briefly explained that we will be using an enteral nutrition product via his feeding tube when he can no longer eat and drink by mouth as easily as he can today.  Recommend beginning with 1 can of Osmolite 1.5 QID with 60 ml free water before and after each bolus feeding.  This will provide approximately 1420 kcal, 59.6 grams protein, and 1204 ml free water.  I have answered multiple questions on his feeding tube.  MONITORING, EVALUATION, AND GOALS:  The patient is tolerating a high-calorie high-protein diet, however, he has been unable to minimize weight loss.  He will continue to work to increase his oral intake with supplements and high-calorie foods and will begin tube feeding if weight loss continues. Tube feedings will be adjusted based on oral intake to meet 100% of his estimated needs.   NEXT VISIT:  Monday, April 1st during chemotherapy.    ______________________________ Zenovia Jarred, RD, LDN Clinical  Nutrition Specialist BN/MEDQ  D:  06/14/2011  T:  06/14/2011  Job:  [id number]

## 2011-06-14 NOTE — Telephone Encounter (Signed)
Gv pt appt for march-may2013 

## 2011-06-14 NOTE — Progress Notes (Signed)
Referral for Speech Therapy faxed to [facility] at fax [phone]  Per Dr. Gaylyn Rong.

## 2011-06-15 ENCOUNTER — Telehealth: Payer: Self-pay

## 2011-06-15 NOTE — Telephone Encounter (Signed)
Called pt re: chemo f/u call.  Pt states he did not sleep well last night, despite taking his sleep medication.  Informed pt this is probably r/t to his steroids that he got as pre-medications yesterday, but this should improve.  Pt denies N/V, states he is drinking well, eating as much as he can, denies any mouth sores.  Pt states he does have some constipation, last BM was Saturday.  Pt states he took 2 tablets of bisacodyl, but has not had any results yet.  Spoke with Dr. Gaylyn Rong, who recommends pt take colace 100 mg bid hold if diarrhea, and when he does not have a BM for 3 days, to try milk of magnesia, miralax, or magnesium citrate OTC.  Informed pt of this, and he verbalizes understanding.  Pt states he does have his drug handouts to refer to, and number to call office if needed.

## 2011-06-15 NOTE — Telephone Encounter (Signed)
Message copied by Abby Potash on Tue Jun 15, 2011 12:20 PM ------      Message from: Tylene Fantasia      Created: Mon Jun 14, 2011 11:59 AM      Regarding: Chemo follow-up      Contact: [phone]       First time Taxol/Carbo.  Dr. Gaylyn Rong.  Oropharynx CA            Thanks       Morrie Sheldon

## 2011-06-21 ENCOUNTER — Ambulatory Visit: Payer: Medicare Other | Admitting: Oncology

## 2011-06-21 ENCOUNTER — Encounter: Payer: Self-pay | Admitting: Oncology

## 2011-06-21 ENCOUNTER — Ambulatory Visit: Payer: Medicare Other | Admitting: Nutrition

## 2011-06-21 ENCOUNTER — Other Ambulatory Visit: Payer: Medicare Other | Admitting: Lab

## 2011-06-21 ENCOUNTER — Ambulatory Visit (HOSPITAL_BASED_OUTPATIENT_CLINIC_OR_DEPARTMENT_OTHER): Payer: Medicare Other

## 2011-06-21 ENCOUNTER — Ambulatory Visit
Admission: RE | Admit: 2011-06-21 | Discharge: 2011-06-21 | Disposition: A | Discharge status: Home / Self Care | Payer: Medicare Other | Source: Ambulatory Visit | Attending: Radiation Oncology | Admitting: Radiation Oncology

## 2011-06-21 ENCOUNTER — Other Ambulatory Visit: Payer: Self-pay | Admitting: Lab

## 2011-06-21 VITALS — BP 126/72 | HR 61 | Temp 97.0°F | Ht 72.0 in | Wt 176.4 lb

## 2011-06-21 DIAGNOSIS — C01 Malignant neoplasm of base of tongue: Secondary | ICD-10-CM

## 2011-06-21 DIAGNOSIS — Z5111 Encounter for antineoplastic chemotherapy: Secondary | ICD-10-CM

## 2011-06-21 DIAGNOSIS — C109 Malignant neoplasm of oropharynx, unspecified: Secondary | ICD-10-CM

## 2011-06-21 DIAGNOSIS — C801 Malignant (primary) neoplasm, unspecified: Secondary | ICD-10-CM

## 2011-06-21 LAB — CBC WITH DIFFERENTIAL/PLATELET
BASO%: 0.2 % (ref 0.0–2.0)
EOS%: 1.9 % (ref 0.0–7.0)
MCH: 29.7 pg (ref 27.2–33.4)
MCV: 89.2 fL (ref 79.3–98.0)
MONO%: 7.9 % (ref 0.0–14.0)
RBC: 4.23 10*6/uL (ref 4.20–5.82)
RDW: 13.6 % (ref 11.0–14.6)
nRBC: 0 % (ref 0–0)

## 2011-06-21 LAB — BASIC METABOLIC PANEL
Calcium: 9.5 mg/dL (ref 8.4–10.5)
Sodium: 139 mEq/L (ref 135–145)

## 2011-06-21 MED ORDER — DIPHENHYDRAMINE HCL 50 MG/ML IJ SOLN
50.0000 mg | Freq: Once | INTRAMUSCULAR | Status: AC
  Administered 2011-06-21: 50 mg via INTRAVENOUS

## 2011-06-21 MED ORDER — DEXTROSE 5 % IV SOLN
45.0000 mg/m2 | Freq: Once | INTRAVENOUS | Status: AC
  Administered 2011-06-21: 90 mg via INTRAVENOUS
  Filled 2011-06-21: qty 15

## 2011-06-21 MED ORDER — DEXAMETHASONE SODIUM PHOSPHATE 4 MG/ML IJ SOLN
20.0000 mg | Freq: Once | INTRAMUSCULAR | Status: AC
  Administered 2011-06-21: 20 mg via INTRAVENOUS

## 2011-06-21 MED ORDER — SODIUM CHLORIDE 0.9 % IV SOLN
Freq: Once | INTRAVENOUS | Status: AC
  Administered 2011-06-21: 12:00:00 via INTRAVENOUS

## 2011-06-21 MED ORDER — HEPARIN SOD (PORK) LOCK FLUSH 100 UNIT/ML IV SOLN
500.0000 [IU] | Freq: Once | INTRAVENOUS | Status: DC | PRN
  Filled 2011-06-21: qty 5

## 2011-06-21 MED ORDER — SODIUM CHLORIDE 0.9 % IJ SOLN
10.0000 mL | INTRAMUSCULAR | Status: DC | PRN
  Filled 2011-06-21: qty 10

## 2011-06-21 MED ORDER — ONDANSETRON 16 MG/50ML IVPB (CHCC)
16.0000 mg | Freq: Once | INTRAVENOUS | Status: AC
  Administered 2011-06-21: 16 mg via INTRAVENOUS

## 2011-06-21 MED ORDER — FAMOTIDINE IN NACL 20-0.9 MG/50ML-% IV SOLN
20.0000 mg | Freq: Once | INTRAVENOUS | Status: AC
  Administered 2011-06-21: 20 mg via INTRAVENOUS

## 2011-06-21 MED ORDER — SODIUM CHLORIDE 0.9 % IV SOLN
138.6000 mg | Freq: Once | INTRAVENOUS | Status: AC
  Administered 2011-06-21: 140 mg via INTRAVENOUS
  Filled 2011-06-21: qty 14

## 2011-06-21 NOTE — Progress Notes (Signed)
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  CURRENT THERAPY: Weekly Carboplatin/Taxol given concurrently with XRT. Chemotherapy was started on 06/14/11. XRT to begin 06/21/11.  INTERVAL HISTORY: Jose [name] 78 y.o. male returns for returns to clinic with his wife. He overall tolerated his first cycle of carboplatin and Taxol well. He had questionable mild reaction to the Taxol. He complained of some difficulty swallowing, but this was short lived and he did not require discontinuation of the drug or additional medications during the infusion. He denies nausea vomiting diarrhea. His appetite is good his weight is stable. He is drinking about 2 cans of Ensure per day by mouth. He is not really using his PEG tube at all except for flushing it on a regular basis. He has been flushing his PEG tube which has not had any purulent discharge, pain, erythema. He has mild fatigue; but is independent of all activities of daily living. He has mild dysphagia. No mucositis. He reports that he is using Biotene mouth rinses 4 times per day. Patient is scheduled to be seen by the speech pathologist tomorrow for education regarding dysphagia. He has mild constipation and has tried MiraLax. He stated that the MiraLax cause a lot of gas he is no longer taking this. He is currently taking an over-the-counter medication from the dollar store. He does not have the name of this medication.   Patient denies headache, visual changes, confusion, drenching night sweats, nausea vomiting, jaundice, chest pain, palpitation, shortness of breath, dyspnea on exertion, productive cough, gum bleeding, epistaxis, hematemesis, hemoptysis, abdominal pain, abdominal swelling, early satiety, melena, hematochezia, hematuria, skin rash, spontaneous  bleeding, joint swelling, joint pain, heat or cold intolerance, bowel bladder incontinence, back pain, focal motor weakness, paresthesia, depression, suicidal or homocidal ideation, feeling hopelessness.   Past Medical History  Diagnosis Date  . Blood transfusion     2006 had 4 units  . Arthritis     knees,back,shoulders  . Anxiety   . Right ureteral stone S/P URETERAL STENT 01-24-11  AND ESWL  03-01-11  . History of acute renal failure 01-23-11    DUE TO HYDRONEPHROSIS AND RIGHT STONE  . Normal cardiac stress test 09-25-2007  . Glaucoma   . Cataract immature   . Diverticulosis of colon   . History of GI diverticular bleed   . Hyperlipemia   . Impaired hearing NOT WEARING HIS BILATERL AIDS  . Urgency of urination     DUE TO URETERAL STONE AND STENT  . Frequency of urination   . Insomnia   . Anemia   . Chronic kidney disease     kidney stones  . GERD (gastroesophageal reflux disease)   . Hernia     inguinal  . Headache     migraines  . Glaucoma   . Hearing impairment     bilateral  . Trigeminal neuralgia   . Hypertension   . Male breast cancer 1988    S/P RIGHT MASTECTOMY W/ NODE DISSECTION AND CHEMO  . 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 Skin cancer 2002    melanoma- back    Past Surgical History  Procedure Date  . Cystoscopy w/ ureteral stent placement 01/24/2011    Procedure: CYSTOSCOPY WITH RETROGRADE PYELOGRAM/URETERAL STENT PLACEMENT;  Surgeon: Molli Hazard  Fabian Sharp, MD;  Location: [facility];  Service: Urology;  Laterality: Right;  . Right leg surg for fx AGE 49    patient states left thigh  . Craniectomy suboccipital for exploration / decompression cranial nerves 2003    5TH CRANIAL NERVE DECOMPRESSION  . Simple mastectomy 1988    MALE--- RIGHT BREAST W/ NODE DISSECTION  . Melanoma excision 10 YRS AGO    BACK AREA  . Extracorporeal shock wave lithotripsy 03-01-11    RIGHT  . Ureteroscopy 03/26/2011    Procedure: URETEROSCOPY;  Surgeon:  Antony Haste, MD;  Location: [facility];  Service: Urology;  Laterality: Right;  RIGHT URETEROSCOPY, HOLMIUM LASER AND STENT C-ARM HOLMIUM LASER  . Examination under anesthesia 03/26/2011    Procedure: EXAM UNDER ANESTHESIA;  Surgeon: Antony Haste, MD;  Location: [facility];  Service: Urology;  Laterality: N/A;  . Lymph node biopsy 04/21/2011    Procedure: LYMPH NODE BIOPSY;  Surgeon: Susy Frizzle, MD;  Location: [facility];  Service: ENT;  Laterality: Right;    Current Outpatient Prescriptions  Medication Sig Dispense Refill  . amLODipine (NORVASC) 10 MG tablet Take 10 mg by mouth every morning.       . brimonidine (ALPHAGAN) 0.2 % ophthalmic solution Place 1 drop into both eyes 2 (two) times daily.  5 mL    . gabapentin (NEURONTIN) 300 MG capsule Take 600 mg by mouth 2 (two) times daily before lunch and supper.       Marland Kitchen LORazepam (ATIVAN) 0.5 MG tablet Take 0.5 mg by mouth 2 (two) times daily as needed. ANXIETY       . Multiple Vitamins-Minerals (MULTIVITAMIN WITH MINERALS) tablet Take 1 tablet by mouth daily.       . ondansetron (ZOFRAN) 8 MG tablet Take 1 tab two times a day starting the day after chemo for 3 days. Then take 1 tab two times a day as needed for nausea or vomiting.  30 tablet  1  . oxyCODONE (OXYCONTIN) 20 MG 12 hr tablet Take 20 mg by mouth every 12 (twelve) hours as needed. For pain      . oxymetazoline (AFRIN) 0.05 % nasal spray Place 1 spray into the nose 2 (two) times daily as needed. ALLERGIES       . prochlorperazine (COMPAZINE) 10 MG tablet Take 1 tablet (10 mg total) by mouth every 6 (six) hours as needed (Nausea or vomiting).  30 tablet  1  . traMADol (ULTRAM) 50 MG tablet Take 50 mg by mouth daily as needed. Maximum dose= 8 tablets per day For pain      . vitamin C (ASCORBIC ACID) 500 MG tablet Take 1,000 mg by mouth daily.       . vitamin E 400 UNIT capsule Take 400 Units by mouth daily.       . Travoprost, BAK  Free, (TRAVATAMN) 0.004 % SOLN ophthalmic solution Place 1 drop into both eyes at bedtime.      Marland Kitchen DISCONTD: amLODipine (NORVASC) 10 MG tablet Take 1 tablet (10 mg total) by mouth daily.  30 tablet  0    ALLERGIES:  is allergic to hydromorphone; morphine and related; and codeine.  REVIEW OF SYSTEMS:  The rest of the 14-point review of system was negative.   Filed Vitals:   06/21/11 0945  BP: 126/72  Pulse: 61  Temp: 97 F (36.1 C)   Wt Readings from Last 3 Encounters:  06/21/11 176 lb 6.4 oz (80.015  kg)  06/14/11 174 lb 9.6 oz (79.198 kg)  05/26/11 178 lb 3.2 oz (80.831 kg)   ECOG Performance status: 1  PHYSICAL EXAMINATION: General: well-nourished in no acute distress. Eyes: no scleral icterus. ENT: There was right base of tongue fullness on visual exam. Neck was without thyromegaly. Lymphatics: Positive for a 3cm left cervical and a 2cm right cervical level III nodes. Right cervical node excision wound has healed without erythema, purulent discharge. There was no supraclavicular or axillary adenopathy. Respiratory: lungs were clear bilaterally without wheezing or crackles. Cardiovascular: Regular rate and rhythm, S1/S2, without murmur, rub or gallop. There was no pedal edema. GI: abdomen was soft, flat, nontender, nondistended, without organomegaly. PEG tube was dry, clean, intact. Muscoloskeletal: no spinal tenderness of palpation of vertebral spine. Skin exam was without echymosis, petichae. Neuro exam was nonfocal. Patient was able to get on and off exam table without assistance. Gait was normal. Patient was alerted and oriented. Attention was good. Language was appropriate. Mood was normal without depression. Speech was not pressured. Thought content was not tangential.    LABORATORY/RADIOLOGY DATA: Results for BRAIN, HONEYCUTT (MRN [id number]) as of 06/21/2011 12:01  Ref. Range 06/21/2011 09:29  WBC Latest Range: 4.0-10.5 K/uL 5.1  RBC Latest Range: 4.22-5.81 MIL/uL 4.23  HGB Latest  Range: 13.0-17.0 g/dL 56.2 (L)  HCT Latest Range: 39.0-52.0 % 37.7 (L)  MCV Latest Range: 78.0-100.0 fL 89.2  MCH Latest Range: 26.0-34.0 pg 29.7  MCHC Latest Range: 30.0-36.0 g/dL 13.0  RDW Latest Range: 11.5-15.5 % 13.6  Platelets Latest Range: 150-400 K/uL 212  NEUT% Latest Range: 39.0-75.0 % 66.3  LYMPH% Latest Range: 14.0-49.0 % 23.7  MONO% Latest Range: 0.0-14.0 % 7.9  EOS% Latest Range: 0.0-7.0 % 1.9  BASO% Latest Range: 0.0-2.0 % 0.2  NEUT# Latest Range: 1.7-7.7 K/uL 3.4  MONO# Latest Range: 0.1-0.9 10e3/uL 0.4  Eosinophils Absolute Latest Range: 0.0-0.5 10e3/uL 0.1  Basophils Absolute Latest Range: 0.0-0.1 K/uL 0.0  lymph# Latest Range: 0.9-3.3 10e3/uL 1.2  nRBC Latest Range: 0-0 % 0   ASSESSMENT AND PLAN:  1. HLP: He is on diet control.  2. HTN: He is on Amlodipine.  3. Trigeminal neuralgia: He is on neurontin and tramadol.  4. History of smoking: He quit in 1982.  5. History of kidney stone in 2012 s/p stent.  6. History of male breast cancer in 1989: S/p mastectomy; s/p adjuvant chemoradiation; s/p 5 years of adjuvant Tamoxifen in distant past. He is in remission from this cancer.  7. Chronic Kidney disease: Stable Cr.  8. Right lung nodule uptake: Low suspicion for met or lung cancer at this time. He has no evidence of active pneumonia. Will pay attention to this on follow up PET.  9. 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 The patient is here for cycle #2 of weekly carboplatin/Taxol today. His counts are acceptable and he'll proceed with his second dose of chemotherapy today. He will begin radiation today. 10. Mucositis prevention: I stressed salt/baking soda mouth rinse.  11. Mild calorie-protein malnutrition: Weight remains stable. He sees [facility]. I encouraged him to continue oral intake with mechanical soft.  12. Nausea/vomiting prophy: He has compazine/zofran/ativan prn.    13. Constipation: I have encouraged patient to use Colace 100 mg twice a day. He may use MiraLax daily as needed. 14. Mild dysphagia: He will undergo evaluation education by speech therapy tomorrow. 15. Follow up: Weekly lab/RV  and chemo.    The patient was seen and examined by Dr Gaylyn Rong. >90% of plan of care developed by Dr Gaylyn Rong. The length of time of the face-to-face encounter was 20 minutes. More than 50% of time was spent counseling and coordination of care.

## 2011-06-21 NOTE — Progress Notes (Signed)
Brief Out patient Oncology Nutrition Note  Spoke with patient and his wife in chemo treatment room. Patient continues to flush tube with water daily. Wife reports patient is eating well, eats small servings. She stated she has been pureeing the food he wants. Today in the treatment room he was eating soup from [employer].  She reported he is drinking 2 Ensure Plus a day. His weight is up 2 lb. To 176.6.   Labs:  CMP     Component Value Date/Time   NA 138 06/14/2011 0820   K 4.2 06/14/2011 0820   CL 103 06/14/2011 0820   CO2 28 06/14/2011 0820   GLUCOSE 83 06/14/2011 0820   BUN 19 06/14/2011 0820   CREATININE 1.42* 06/14/2011 0820   CALCIUM 9.2 06/14/2011 0820   PROT 7.0 06/14/2011 0820   ALBUMIN 3.5 06/14/2011 0820   AST 23 06/14/2011 0820   ALT 25 06/14/2011 0820   ALKPHOS 75 06/14/2011 0820   BILITOT 0.4 06/14/2011 0820   GFRNONAA 42* 05/17/2011 0915   GFRAA 48* 05/17/2011 0915    Weight Status:  176.6 lb.  *Up 2 lb. From 174.6 on 06/14/11.  Nutrition Dx:  Inadequate Oral Intake. Status: Ongoing.   Goal:  1. Weight maintenance/ weight gain 2. Continue to increase oral intake of high-calorie, high protein foods.  Intervention:  Encouraged patient to continue to consume high calorie, high protein foods and to continue to drink the Ensure Plus daily, 4 as able. Informed patient to mix Ensure with ice cream to increase calorie content.   Monitor:  Will continue to monitor weight for need for tube feeding initiation. Will follow up with patient at next chemo treatment visit.    Iven Finn [facility] Pager #:  [phone]

## 2011-06-22 ENCOUNTER — Ambulatory Visit
Admission: RE | Admit: 2011-06-22 | Discharge: 2011-06-22 | Disposition: A | Discharge status: Home / Self Care | Payer: Medicare Other | Source: Ambulatory Visit | Attending: Radiation Oncology | Admitting: Radiation Oncology

## 2011-06-22 ENCOUNTER — Encounter: Payer: Self-pay | Admitting: Radiation Oncology

## 2011-06-22 ENCOUNTER — Ambulatory Visit: Payer: Medicare Other | Attending: Oncology

## 2011-06-22 VITALS — BP 138/69 | HR 62 | Temp 96.9°F | Wt 180.2 lb

## 2011-06-22 DIAGNOSIS — R1312 Dysphagia, oropharyngeal phase: Secondary | ICD-10-CM | POA: Insufficient documentation

## 2011-06-22 DIAGNOSIS — C01 Malignant neoplasm of base of tongue: Secondary | ICD-10-CM

## 2011-06-22 DIAGNOSIS — IMO0001 Reserved for inherently not codable concepts without codable children: Secondary | ICD-10-CM | POA: Insufficient documentation

## 2011-06-22 NOTE — Progress Notes (Signed)
Weekly Management Note Current Dose: 4.24 Gy  Projected Dose: 69.96 Gy   Narrative:  The patient presents for routine under treatment assessment.  CBCT/MVCT images/Port film x-rays were reviewed.  The chart was checked. Post sim education performed. No complaints. Saw Speech Therapy today.   Physical Findings: Forgetful. Appears stated age. No skin changes.  Vitals:  Filed Vitals:   06/22/11 1201  BP: 138/69  Pulse: 62  Temp: 96.9 F (36.1 C)   Weight:  Wt Readings from Last 3 Encounters:  06/22/11 180 lb 3.2 oz (81.738 kg)  06/21/11 176 lb 9.6 oz (80.105 kg)  06/21/11 176 lb 6.4 oz (80.015 kg)   Lab Results  Component Value Date   WBC 5.1 06/21/2011   HGB 12.6* 06/21/2011   HCT 37.7* 06/21/2011   MCV 89.2 06/21/2011   PLT 212 06/21/2011   Lab Results  Component Value Date   CREATININE 1.42* 06/21/2011   BUN 25* 06/21/2011   NA 139 06/21/2011   K 4.5 06/21/2011   CL 105 06/21/2011   CO2 28 06/21/2011     Impression:  The patient is tolerating radiation.  Plan:  Continue treatment as planned. Monitor closely. Post sim education performed. Patient had no questions.

## 2011-06-22 NOTE — Progress Notes (Signed)
2nd fraction to base of tongue.  Post -sim education regarding management of soremouth/throat, mouth care, pain, skin care, use of Peg, fatigue, importtance of exercising jaw frequently, and length of treatment course Informed that he will see Dr. Mitzi Hansen weekly on every Tuesday unless schedule changes.   Seen by Verdie Mosher today.  Already seen by the dietitian, Zenovia Jarred.  Peg feeding tube in place.  Flushes daily.  Eating by mouth.  Edentulous.

## 2011-06-23 ENCOUNTER — Ambulatory Visit
Admission: RE | Admit: 2011-06-23 | Discharge: 2011-06-23 | Disposition: A | Discharge status: Home / Self Care | Payer: Medicare Other | Source: Ambulatory Visit | Attending: Radiation Oncology | Admitting: Radiation Oncology

## 2011-06-23 ENCOUNTER — Ambulatory Visit: Payer: Medicare Other

## 2011-06-24 ENCOUNTER — Ambulatory Visit
Admission: RE | Admit: 2011-06-24 | Discharge: 2011-06-24 | Disposition: A | Discharge status: Home / Self Care | Payer: Medicare Other | Source: Ambulatory Visit | Attending: Radiation Oncology | Admitting: Radiation Oncology

## 2011-06-25 ENCOUNTER — Ambulatory Visit
Admission: RE | Admit: 2011-06-25 | Discharge: 2011-06-25 | Disposition: A | Discharge status: Home / Self Care | Payer: Medicare Other | Source: Ambulatory Visit | Attending: Radiation Oncology | Admitting: Radiation Oncology

## 2011-06-27 NOTE — Patient Instructions (Signed)
1.  Head and neck cancer:  - Continue with weekly chemo Carboplatin/Taxol - Continue with daily radiation.  2.  Mouth rinse:  Alternate between the followings: - salt/baking soda (1 teaspoon of salt; 1 teaspoon of bakign soda; in 1 Liter of water).   - diluted hydrogen peroxide (1 part peroxide to 4 parts water)

## 2011-06-28 ENCOUNTER — Ambulatory Visit (HOSPITAL_BASED_OUTPATIENT_CLINIC_OR_DEPARTMENT_OTHER): Payer: Medicare Other | Admitting: Oncology

## 2011-06-28 ENCOUNTER — Ambulatory Visit: Payer: Medicare Other | Admitting: Nutrition

## 2011-06-28 ENCOUNTER — Ambulatory Visit
Admission: RE | Admit: 2011-06-28 | Discharge: 2011-06-28 | Disposition: A | Discharge status: Home / Self Care | Payer: Medicare Other | Source: Ambulatory Visit | Attending: Radiation Oncology | Admitting: Radiation Oncology

## 2011-06-28 ENCOUNTER — Ambulatory Visit (HOSPITAL_BASED_OUTPATIENT_CLINIC_OR_DEPARTMENT_OTHER): Payer: Medicare Other

## 2011-06-28 ENCOUNTER — Other Ambulatory Visit (HOSPITAL_BASED_OUTPATIENT_CLINIC_OR_DEPARTMENT_OTHER): Payer: Self-pay

## 2011-06-28 ENCOUNTER — Other Ambulatory Visit: Payer: Self-pay | Admitting: Lab

## 2011-06-28 VITALS — BP 128/67 | HR 67 | Temp 97.3°F | Ht 72.0 in | Wt 173.8 lb

## 2011-06-28 DIAGNOSIS — C01 Malignant neoplasm of base of tongue: Secondary | ICD-10-CM

## 2011-06-28 DIAGNOSIS — C801 Malignant (primary) neoplasm, unspecified: Secondary | ICD-10-CM

## 2011-06-28 DIAGNOSIS — Z5111 Encounter for antineoplastic chemotherapy: Secondary | ICD-10-CM

## 2011-06-28 DIAGNOSIS — C109 Malignant neoplasm of oropharynx, unspecified: Secondary | ICD-10-CM

## 2011-06-28 DIAGNOSIS — E46 Unspecified protein-calorie malnutrition: Secondary | ICD-10-CM

## 2011-06-28 DIAGNOSIS — R911 Solitary pulmonary nodule: Secondary | ICD-10-CM

## 2011-06-28 LAB — CBC WITH DIFFERENTIAL/PLATELET
BASO%: 1.2 % (ref 0.0–2.0)
EOS%: 1.4 % (ref 0.0–7.0)
LYMPH%: 20.6 % (ref 14.0–49.0)
MCHC: 33.7 g/dL (ref 32.0–36.0)
MONO#: 0.5 10*3/uL (ref 0.1–0.9)
Platelets: 222 10*3/uL (ref 140–400)
RBC: 3.99 10*6/uL — ABNORMAL LOW (ref 4.20–5.82)
WBC: 4.2 10*3/uL (ref 4.0–10.3)
nRBC: 0 % (ref 0–0)

## 2011-06-28 LAB — COMPREHENSIVE METABOLIC PANEL
ALT: 26 U/L (ref 0–53)
CO2: 27 mEq/L (ref 19–32)
Calcium: 9 mg/dL (ref 8.4–10.5)
Chloride: 106 mEq/L (ref 96–112)
Sodium: 137 mEq/L (ref 135–145)
Total Bilirubin: 0.4 mg/dL (ref 0.3–1.2)
Total Protein: 6.5 g/dL (ref 6.0–8.3)

## 2011-06-28 MED ORDER — FAMOTIDINE IN NACL 20-0.9 MG/50ML-% IV SOLN
20.0000 mg | Freq: Once | INTRAVENOUS | Status: AC
  Administered 2011-06-28: 20 mg via INTRAVENOUS

## 2011-06-28 MED ORDER — DEXAMETHASONE SODIUM PHOSPHATE 4 MG/ML IJ SOLN
20.0000 mg | Freq: Once | INTRAMUSCULAR | Status: AC
  Administered 2011-06-28: 20 mg via INTRAVENOUS

## 2011-06-28 MED ORDER — OSMOLITE 1.5 CAL PO LIQD: ORAL | Status: DC

## 2011-06-28 MED ORDER — PACLITAXEL CHEMO INJECTION 300 MG/50ML
45.0000 mg/m2 | Freq: Once | INTRAVENOUS | Status: AC
  Administered 2011-06-28: 90 mg via INTRAVENOUS
  Filled 2011-06-28: qty 15

## 2011-06-28 MED ORDER — SODIUM CHLORIDE 0.9 % IV SOLN
138.6000 mg | Freq: Once | INTRAVENOUS | Status: AC
  Administered 2011-06-28: 140 mg via INTRAVENOUS
  Filled 2011-06-28: qty 14

## 2011-06-28 MED ORDER — SODIUM CHLORIDE 0.9 % IV SOLN
Freq: Once | INTRAVENOUS | Status: AC
  Administered 2011-06-28: 11:00:00 via INTRAVENOUS

## 2011-06-28 MED ORDER — ONDANSETRON 16 MG/50ML IVPB (CHCC)
16.0000 mg | Freq: Once | INTRAVENOUS | Status: AC
  Administered 2011-06-28: 16 mg via INTRAVENOUS

## 2011-06-28 MED ORDER — DIPHENHYDRAMINE HCL 50 MG/ML IJ SOLN
50.0000 mg | Freq: Once | INTRAMUSCULAR | Status: AC
  Administered 2011-06-28: 50 mg via INTRAVENOUS

## 2011-06-28 NOTE — Patient Instructions (Signed)
[facility] Discharge Instructions for Patients Receiving Chemotherapy  Today you received the following chemotherapy agents Taxol and Carboplatin.  To help prevent nausea and vomiting after your treatment, we encourage you to take your nausea medication as prescribed by your physician.  If you develop nausea and vomiting that is not controlled by your nausea medication, call the clinic. If it is after clinic hours your family physician or the after hours number for the clinic or go to the Emergency Department.   BELOW ARE SYMPTOMS THAT SHOULD BE REPORTED IMMEDIATELY:  *FEVER GREATER THAN 100.5 F  *CHILLS WITH OR WITHOUT FEVER  NAUSEA AND VOMITING THAT IS NOT CONTROLLED WITH YOUR NAUSEA MEDICATION  *UNUSUAL SHORTNESS OF BREATH  *UNUSUAL BRUISING OR BLEEDING  TENDERNESS IN MOUTH AND THROAT WITH OR WITHOUT PRESENCE OF ULCERS  *URINARY PROBLEMS  *BOWEL PROBLEMS  UNUSUAL RASH Items with * indicate a potential emergency and should be followed up as soon as possible.   Feel free to call the clinic you have any questions or concerns. The clinic phone number is [phone].   I have been informed and understand all the instructions given to me. I know to contact the clinic, my physician, or go to the Emergency Department if any problems should occur. I do not have any questions at this time, but understand that I may call the clinic during office hours   should I have any questions or need assistance in obtaining follow up care.    __________________________________________  _____________  __________ Signature of Patient or Authorized Representative            Date                   Time    __________________________________________ Nurse's Signature

## 2011-06-28 NOTE — Progress Notes (Signed)
Mr. Burtch has continued to have weight loss.  His weight was documented as 173.8 pounds today, which is decreased from 176 pounds last week.  He is having a little bit more difficulty eating, however, his wife is providing him with some very nice options with good variety.  He is drinking Ensure Plus 2-3 times a day.  His creatinine has decreased to 1.42 on March 25.  NUTRITION DIAGNOSIS:  Inadequate oral intake continues.  INTERVENTION:  I am beginning Osmolite 1.5 via feeding tube q.i.d. with 60 mL of water before and after each bolus feeding.  I have educated both patient and his wife today on tube feeding administration.  He is to continue to drink Ensure Plus as tolerated and I have assured him that it was very important for him to continue to eat as much as possible as this is only going to be providing about 1420 calories and 60 g of protein which is roughly half of his estimated needs.  The patient and wife verbalized understanding.  Tube feeding orders were written and will be faxed to [facility].  MONITORING/EVALUATION/GOALS:  The patient is tolerating his oral diet, however, has been unable to minimize weight loss.  He will begin tube feeding and tolerate tube feeding and oral diet to promote weight maintenance.  NEXT VISIT:  Monday, April 15 during chemotherapy.    ______________________________ Zenovia Jarred, RD, LDN Clinical Nutrition Specialist BN/MEDQ  D:  06/28/2011  T:  06/28/2011  Job:  [id number]

## 2011-06-28 NOTE — Progress Notes (Signed)
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   CURRENT THERAPY: started definitive concurrent chemoradiation with weekly Carboplatin/Taxol on 06/14/2011; and daily XRT 06/21/11.   INTERVAL HISTORY: Jose [name] 78 y.o. male returns to clinic with his wife.  He has decreased appetite; but he is still able to eat regular foods.  His stamina is stable.  He has baseline fatigue even before starting chemoradiation; but his fatigue has not worsened.  He has developed dry mouth.  He is doing mouth rinse with salt/baking soda but has not started diluted H2O2.  He has not mouth pain yet. PEG is has caused some soreness but no purulent discharge or erythema.   Patient denies fever, headache, visual changes, confusion, drenching night sweats, palpable lymph node swelling,odynophagia, dysphagia, nausea vomiting, jaundice, chest pain, palpitation, shortness of breath, dyspnea on exertion, productive cough, gum bleeding, epistaxis, hematemesis, hemoptysis, abdominal pain, abdominal swelling, early satiety, melena, hematochezia, hematuria, skin rash, spontaneous bleeding, joint swelling, joint pain, heat or cold intolerance, bowel bladder incontinence, back pain, focal motor weakness, paresthesia, depression, suicidal or homocidal ideation, feeling hopelessness.    Past Medical History  Diagnosis Date  . Blood transfusion     2006 had 4 units  . Arthritis     knees,back,shoulders  . Anxiety   . Right ureteral stone S/P URETERAL STENT 01-24-11  AND ESWL  03-01-11  . History of acute renal failure 01-23-11    DUE TO HYDRONEPHROSIS AND RIGHT STONE  . Normal cardiac stress test 09-25-2007  . Glaucoma   . Cataract immature   . Diverticulosis of colon   . History of GI diverticular bleed   . Hyperlipemia   .  Impaired hearing NOT WEARING HIS BILATERL AIDS  . Urgency of urination     DUE TO URETERAL STONE AND STENT  . Frequency of urination   . Insomnia   . Anemia   . Chronic kidney disease     kidney stones  . GERD (gastroesophageal reflux disease)   . Hernia     inguinal  . Headache     migraines  . Glaucoma   . Hearing impairment     bilateral  . Trigeminal neuralgia   . Hypertension   . Male breast cancer 1988    S/P RIGHT MASTECTOMY W/ NODE DISSECTION AND CHEMO  . 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 Skin cancer 2002    melanoma- back    Past Surgical History  Procedure Date  . Cystoscopy w/ ureteral stent placement 01/24/2011    Procedure: CYSTOSCOPY WITH RETROGRADE PYELOGRAM/URETERAL STENT PLACEMENT;  Surgeon: Antony Haste, MD;  Location: [facility];  Service: Urology;  Laterality: Right;  . Right leg surg for fx AGE 34    patient states left thigh  . Craniectomy suboccipital for exploration / decompression cranial nerves 2003    5TH CRANIAL NERVE DECOMPRESSION  . Simple mastectomy 1988    MALE--- RIGHT BREAST W/ NODE DISSECTION  . Melanoma excision 10 YRS AGO    BACK AREA  . Extracorporeal shock wave lithotripsy 03-01-11    RIGHT  . Ureteroscopy 03/26/2011    Procedure: URETEROSCOPY;  Surgeon: Antony Haste, MD;  Location: [name];  Service: Urology;  Laterality: Right;  RIGHT URETEROSCOPY, HOLMIUM  LASER AND STENT C-ARM HOLMIUM LASER  . Examination under anesthesia 03/26/2011    Procedure: EXAM UNDER ANESTHESIA;  Surgeon: Antony Haste, MD;  [name]  Service: Urology;  Laterality: N/A;  . Lymph node biopsy 04/21/2011    Procedure: LYMPH NODE BIOPSY;  Surgeon: Susy Frizzle, MD;  Location: [facility];  Service: ENT;  Laterality: Right;    Current Outpatient Prescriptions  Medication Sig Dispense Refill  . amLODipine (NORVASC) 10 MG tablet Take 10 mg by mouth every morning.       .  brimonidine (ALPHAGAN) 0.2 % ophthalmic solution Place 1 drop into both eyes 2 (two) times daily.  5 mL    . gabapentin (NEURONTIN) 300 MG capsule Take 600 mg by mouth 2 (two) times daily before lunch and supper.       Marland Kitchen LORazepam (ATIVAN) 0.5 MG tablet Take 0.5 mg by mouth 2 (two) times daily as needed. ANXIETY       . Multiple Vitamins-Minerals (MULTIVITAMIN WITH MINERALS) tablet Take 1 tablet by mouth daily.       . ondansetron (ZOFRAN) 8 MG tablet Take 1 tab two times a day starting the day after chemo for 3 days. Then take 1 tab two times a day as needed for nausea or vomiting.  30 tablet  1  . oxyCODONE (OXYCONTIN) 20 MG 12 hr tablet Take 20 mg by mouth every 12 (twelve) hours as needed. For pain      . oxymetazoline (AFRIN) 0.05 % nasal spray Place 1 spray into the nose 2 (two) times daily as needed. ALLERGIES       . traMADol (ULTRAM) 50 MG tablet Take 50 mg by mouth daily as needed. Maximum dose= 8 tablets per day For pain      . Travoprost, BAK Free, (TRAVATAMN) 0.004 % SOLN ophthalmic solution Place 1 drop into both eyes at bedtime.      . vitamin C (ASCORBIC ACID) 500 MG tablet Take 1,000 mg by mouth daily.       . vitamin E 400 UNIT capsule Take 400 Units by mouth daily.       . prochlorperazine (COMPAZINE) 10 MG tablet Take 1 tablet (10 mg total) by mouth every 6 (six) hours as needed (Nausea or vomiting).  30 tablet  1  . DISCONTD: amLODipine (NORVASC) 10 MG tablet Take 1 tablet (10 mg total) by mouth daily.  30 tablet  0   No current facility-administered medications for this visit.   Facility-Administered Medications Ordered in Other Visits  Medication Dose Route Frequency Provider Last Rate Last Dose  . 0.9 %  sodium chloride infusion   Intravenous Once Exie Parody, MD 20 mL/hr at 06/28/11 1034    . CARBOplatin (PARAPLATIN) 140 mg in sodium chloride 0.9 % 100 mL chemo infusion  140 mg Intravenous Once Exie Parody, MD      . dexamethasone (DECADRON) injection 20 mg  20 mg  Intravenous Once Exie Parody, MD   20 mg at 06/28/11 1038  . diphenhydrAMINE (BENADRYL) injection 50 mg  50 mg Intravenous Once Exie Parody, MD   50 mg at 06/28/11 1038  . famotidine (PEPCID) IVPB 20 mg  20 mg Intravenous Once Exie Parody, MD      . ondansetron (ZOFRAN) IVPB 16 mg  16 mg Intravenous Once Exie Parody, MD   16 mg at 06/28/11 1038  . PACLitaxel (TAXOL) 90 mg in dextrose 5 % 250 mL  chemo infusion (</= 80mg /m2)  45 mg/m2 (Treatment Plan Actual) Intravenous Once Exie Parody, MD        ALLERGIES:  is allergic to hydromorphone; morphine and related; and codeine.  REVIEW OF SYSTEMS:  The rest of the 14-point review of system was negative.   Filed Vitals:   06/28/11 0930  BP: 128/67  Pulse: 67  Temp: 97.3 F (36.3 C)   Wt Readings from Last 3 Encounters:  06/28/11 173 lb 12.8 oz (78.835 kg)  06/22/11 180 lb 3.2 oz (81.738 kg)  06/21/11 176 lb 9.6 oz (80.105 kg)   ECOG Performance status: 1  PHYSICAL EXAMINATION:   General: well-nourished in no acute distress. Eyes: no scleral icterus. ENT: There was right base of tongue fullness on visual exam.  Neck was without thyromegaly. Lymphatics: Positive for a 3cm left cervical node.  I could not feel right cervical node on today exam. There was no supraclavicular or axillary adenopathy. Respiratory: lungs were clear bilaterally without wheezing or crackles. Cardiovascular: Regular rate and rhythm, S1/S2, without murmur, rub or gallop. There was no pedal edema. GI: abdomen was soft, flat, nontender, nondistended, without organomegaly.  PEG tube was dry, clean, intact.  Muscoloskeletal: no spinal tenderness of palpation of vertebral spine. Skin exam was without echymosis, petichae. Neuro exam was nonfocal. Patient was able to get on and off exam table without assistance. Gait was normal. Patient was alerted and oriented. Attention was good. Language was appropriate. Mood was normal without depression. Speech was not pressured. Thought content was not  tangential.    LABORATORY:   WBC 4.2; Hgb 11.7; Plt 222; Cr last week was 1.42.     ASSESSMENT AND PLAN:   1.  HLP:  He is on diet control. 2.  HTN:  He is on Amlodipine.  3.  Trigeminal neuralgia:  He is on neurontin and tramadol. 4.  History of smoking:  He quit in 1982.  5.  History of kidney stone in 2012 s/p stent. 6.  History of male breast cancer in 1989:   S/p mastectomy; s/p adjuvant chemoradiation; s/p 5 years of adjuvant Tamoxifen in distant past.  He is in remission from this cancer. 7.  Chronic Kidney disease:  Stable Cr.  8.  Right lung nodule uptake:  Low suspicion for met or lung cancer at this time.  He has no evidence of active pneumonia.  Will pay attention to this on follow up PET.  9.  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   - He is s/p two weekly doses of Carboplatin/Taxol with grade 1 fatigue; grade 1 anorexia; and weight loss.  There is no indication for dose reduction yet.  I advised him to continue with weekly Carboplatin/Taxol and daily XRT.   10.  Mucositis prevention:  I advised him to continue with salt/baking soda mouth rinse alternating with diluted H2O2 (1:4 dilution) to thin out thick phlegm.   11.  Mild calorie-protein malnutrition:  He has PEG tube.  But he is able to continue to eat by mouth.   12.  Nausea/vomiting prophy:  He has compazine/zofran/ativan prn.  13.  Follow up:  Weekly lab/RV and chemo.

## 2011-06-29 ENCOUNTER — Ambulatory Visit
Admission: RE | Admit: 2011-06-29 | Discharge: 2011-06-29 | Disposition: A | Discharge status: Home / Self Care | Payer: Medicare Other | Source: Ambulatory Visit | Attending: Radiation Oncology | Admitting: Radiation Oncology

## 2011-06-30 ENCOUNTER — Ambulatory Visit
Admission: RE | Admit: 2011-06-30 | Discharge: 2011-06-30 | Disposition: A | Discharge status: Home / Self Care | Payer: Medicare Other | Source: Ambulatory Visit | Attending: Radiation Oncology | Admitting: Radiation Oncology

## 2011-07-01 ENCOUNTER — Ambulatory Visit
Admission: RE | Admit: 2011-07-01 | Discharge: 2011-07-01 | Disposition: A | Discharge status: Home / Self Care | Payer: Medicare Other | Source: Ambulatory Visit | Attending: Radiation Oncology | Admitting: Radiation Oncology

## 2011-07-01 ENCOUNTER — Encounter: Payer: Self-pay | Admitting: Radiation Oncology

## 2011-07-01 VITALS — BP 130/65 | HR 63 | Resp 18 | Wt 174.3 lb

## 2011-07-01 DIAGNOSIS — C01 Malignant neoplasm of base of tongue: Secondary | ICD-10-CM

## 2011-07-01 NOTE — Progress Notes (Addendum)
Patient presents to the clinic today unaccompanied for under treat visit with Dr. Mitzi Hansen. Patient is alert and oriented to person, place, and time. No distress noted. Steady gait noted. Pleasant affect noted. Patient reports throat pain when he swallowing 10 on a scale of 0-10. Also, patient reports that he has noted several mouth sores. Patient denies nausea, vomiting, headache, or diarrhea. Patient reports that he began using his PEG tube last night. Patient denies having an appetite.  Reported all findings to Dr. Mitzi Hansen.

## 2011-07-01 NOTE — Progress Notes (Signed)
Department of Radiation Oncology  Phone:  [phone] Fax:        [phone]  Weekly Treatment Note    Name: [name] Date: 07/01/2011 MRN: [id number] DOB: April 16, 1927   Current dose: 19.08 gray  Current fraction: 9   MEDICATIONS: Current Outpatient Prescriptions  Medication Sig Dispense Refill  . amLODipine (NORVASC) 10 MG tablet Take 10 mg by mouth every morning.       . brimonidine (ALPHAGAN) 0.2 % ophthalmic solution Place 1 drop into both eyes 2 (two) times daily.  5 mL    . gabapentin (NEURONTIN) 300 MG capsule Take 600 mg by mouth 2 (two) times daily before lunch and supper.       Marland Kitchen LORazepam (ATIVAN) 0.5 MG tablet Take 0.5 mg by mouth 2 (two) times daily as needed. ANXIETY       . Multiple Vitamins-Minerals (MULTIVITAMIN WITH MINERALS) tablet Take 1 tablet by mouth daily.       . Nutritional Supplements (FEEDING SUPPLEMENT, OSMOLITE 1.5 CAL,) LIQD Begin one can of Osmolite 1.5 via feeding tube QID at  0600, 1000, 1400, and 2000 daily.  Flush with 60 ml free water before and after each bolus feeding as tolerated.  Please send supplies, formula and RN for teaching.  948 mL  0  . ondansetron (ZOFRAN) 8 MG tablet Take 1 tab two times a day starting the day after chemo for 3 days. Then take 1 tab two times a day as needed for nausea or vomiting.  30 tablet  1  . oxyCODONE (OXYCONTIN) 20 MG 12 hr tablet Take 20 mg by mouth every 12 (twelve) hours as needed. For pain      . oxymetazoline (AFRIN) 0.05 % nasal spray Place 1 spray into the nose 2 (two) times daily as needed. ALLERGIES       . prochlorperazine (COMPAZINE) 10 MG tablet Take 1 tablet (10 mg total) by mouth every 6 (six) hours as needed (Nausea or vomiting).  30 tablet  1  . traMADol (ULTRAM) 50 MG tablet Take 50 mg by mouth daily as needed. Maximum dose= 8 tablets per day For pain      . Travoprost, BAK Free, (TRAVATAMN) 0.004 % SOLN ophthalmic solution Place 1 drop into both eyes at bedtime.      . vitamin C  (ASCORBIC ACID) 500 MG tablet Take 1,000 mg by mouth daily.       . vitamin E 400 UNIT capsule Take 400 Units by mouth daily.       Marland Kitchen DISCONTD: amLODipine (NORVASC) 10 MG tablet Take 1 tablet (10 mg total) by mouth daily.  30 tablet  0     ALLERGIES: Hydromorphone; Morphine and related; and Codeine   LABORATORY DATA:  Lab Results  Component Value Date   WBC 4.2 06/28/2011   HGB 11.7* 06/28/2011   HCT 34.7* 06/28/2011   MCV 87.0 06/28/2011   PLT 222 06/28/2011   Lab Results  Component Value Date   NA 137 06/28/2011   K 4.8 06/28/2011   CL 106 06/28/2011   CO2 27 06/28/2011   Lab Results  Component Value Date   ALT 26 06/28/2011   AST 18 06/28/2011   ALKPHOS 72 06/28/2011   BILITOT 0.4 06/28/2011     NARRATIVE: [name] was seen today for weekly treatment management. The chart was checked and the patient's films were reviewed. The patient is complaining of significant sore throat at this point. He describes this when he  swallows as a 10 level of pain. I discussed writing the patient a prescription for pain medication but he is very concerned about constipation. He decided after this discussion that he did not want anything at this time. I did indicate to him that I thought that this would certainly be something that he would need to do at some point during his treatment. The patient was steadfast in terms of holding off however. The patient did begin using his feeding tube last night  PHYSICAL EXAMINATION: weight is 174 lb 4.8 oz (79.062 kg). His blood pressure is 130/65 and his pulse is 63. His respiration is 18.      skin looks good. No significant mucositis present although this is beginning to emerge a little bit.  ASSESSMENT: The patient is doing satisfactorily with treatment.  PLAN: We will continue with the patient's radiation treatment as planned. The patient is to let us know if he needs some pain medication earlier than when I see him again next week and we will review this at that time.  The patient is urged to keep up his weight. Again he has begun using his feeding tube.

## 2011-07-02 ENCOUNTER — Ambulatory Visit (HOSPITAL_BASED_OUTPATIENT_CLINIC_OR_DEPARTMENT_OTHER)
Admission: RE | Admit: 2011-07-02 | Discharge: 2011-07-02 | Disposition: A | Discharge status: Home / Self Care | Payer: Medicare Other | Source: Ambulatory Visit | Attending: Radiation Oncology | Admitting: Radiation Oncology

## 2011-07-05 ENCOUNTER — Ambulatory Visit
Admission: RE | Admit: 2011-07-05 | Discharge: 2011-07-05 | Disposition: A | Discharge status: Home / Self Care | Payer: Medicare Other | Source: Ambulatory Visit | Attending: Radiation Oncology | Admitting: Radiation Oncology

## 2011-07-05 ENCOUNTER — Ambulatory Visit (HOSPITAL_BASED_OUTPATIENT_CLINIC_OR_DEPARTMENT_OTHER): Payer: Medicare Other | Admitting: Oncology

## 2011-07-05 ENCOUNTER — Other Ambulatory Visit: Payer: Self-pay

## 2011-07-05 ENCOUNTER — Encounter: Payer: Self-pay | Admitting: Oncology

## 2011-07-05 ENCOUNTER — Other Ambulatory Visit: Payer: Medicare Other

## 2011-07-05 ENCOUNTER — Ambulatory Visit: Payer: Medicare Other

## 2011-07-05 ENCOUNTER — Ambulatory Visit: Payer: Medicare Other | Admitting: Nutrition

## 2011-07-05 ENCOUNTER — Ambulatory Visit (HOSPITAL_BASED_OUTPATIENT_CLINIC_OR_DEPARTMENT_OTHER): Payer: Medicare Other

## 2011-07-05 VITALS — BP 129/65 | HR 63 | Temp 97.0°F | Ht 72.0 in | Wt 171.5 lb

## 2011-07-05 DIAGNOSIS — C7839 Secondary malignant neoplasm of other respiratory organs: Secondary | ICD-10-CM

## 2011-07-05 DIAGNOSIS — C01 Malignant neoplasm of base of tongue: Secondary | ICD-10-CM

## 2011-07-05 DIAGNOSIS — C770 Secondary and unspecified malignant neoplasm of lymph nodes of head, face and neck: Secondary | ICD-10-CM

## 2011-07-05 DIAGNOSIS — C109 Malignant neoplasm of oropharynx, unspecified: Secondary | ICD-10-CM

## 2011-07-05 DIAGNOSIS — Z5111 Encounter for antineoplastic chemotherapy: Secondary | ICD-10-CM

## 2011-07-05 DIAGNOSIS — C801 Malignant (primary) neoplasm, unspecified: Secondary | ICD-10-CM

## 2011-07-05 LAB — COMPREHENSIVE METABOLIC PANEL
AST: 18 U/L (ref 0–37)
Albumin: 3.8 g/dL (ref 3.5–5.2)
BUN: 22 mg/dL (ref 6–23)
Calcium: 8.7 mg/dL (ref 8.4–10.5)
Chloride: 105 mEq/L (ref 96–112)
Glucose, Bld: 147 mg/dL — ABNORMAL HIGH (ref 70–99)
Potassium: 4.7 mEq/L (ref 3.5–5.3)

## 2011-07-05 LAB — CBC WITH DIFFERENTIAL/PLATELET
BASO%: 2.1 % — ABNORMAL HIGH (ref 0.0–2.0)
EOS%: 0.9 % (ref 0.0–7.0)
MCH: 29.5 pg (ref 27.2–33.4)
MCHC: 34.1 g/dL (ref 32.0–36.0)
MCV: 86.4 fL (ref 79.3–98.0)
MONO%: 14.4 % — ABNORMAL HIGH (ref 0.0–14.0)
RDW: 13.6 % (ref 11.0–14.6)
lymph#: 0.6 10*3/uL — ABNORMAL LOW (ref 0.9–3.3)

## 2011-07-05 MED ORDER — SODIUM CHLORIDE 0.9 % IV SOLN
Freq: Once | INTRAVENOUS | Status: AC
  Administered 2011-07-05: 10:00:00 via INTRAVENOUS

## 2011-07-05 MED ORDER — SODIUM CHLORIDE 0.9 % IV SOLN
138.6000 mg | Freq: Once | INTRAVENOUS | Status: AC
  Administered 2011-07-05: 140 mg via INTRAVENOUS
  Filled 2011-07-05: qty 14

## 2011-07-05 MED ORDER — PACLITAXEL CHEMO INJECTION 300 MG/50ML
45.0000 mg/m2 | Freq: Once | INTRAVENOUS | Status: AC
  Administered 2011-07-05: 90 mg via INTRAVENOUS
  Filled 2011-07-05: qty 15

## 2011-07-05 MED ORDER — HYDROCODONE-ACETAMINOPHEN 7.5-500 MG/15ML PO SOLN: 15.0000 mL | Freq: Four times a day (QID) | ORAL | Status: AC | PRN

## 2011-07-05 MED ORDER — DEXAMETHASONE SODIUM PHOSPHATE 4 MG/ML IJ SOLN
20.0000 mg | Freq: Once | INTRAMUSCULAR | Status: AC
  Administered 2011-07-05: 20 mg via INTRAVENOUS

## 2011-07-05 MED ORDER — FAMOTIDINE IN NACL 20-0.9 MG/50ML-% IV SOLN
20.0000 mg | Freq: Once | INTRAVENOUS | Status: AC
  Administered 2011-07-05: 20 mg via INTRAVENOUS

## 2011-07-05 MED ORDER — OSMOLITE 1.5 CAL PO LIQD: ORAL | Status: DC

## 2011-07-05 MED ORDER — DIPHENHYDRAMINE HCL 50 MG/ML IJ SOLN
50.0000 mg | Freq: Once | INTRAMUSCULAR | Status: AC
  Administered 2011-07-05: 50 mg via INTRAVENOUS

## 2011-07-05 MED ORDER — ONDANSETRON 16 MG/50ML IVPB (CHCC)
16.0000 mg | Freq: Once | INTRAVENOUS | Status: AC
  Administered 2011-07-05: 16 mg via INTRAVENOUS

## 2011-07-05 NOTE — Patient Instructions (Signed)
1.  Head and neck cancer:  - Continue with weekly chemo Carboplatin/Taxol - Continue with daily radiation.  2.  Mouth rinse:  Alternate between the followings: - salt/baking soda (1 teaspoon of salt; 1 teaspoon of bakign soda; in 1 Liter of water).   - diluted hydrogen peroxide (1 part peroxide to 4 parts water)  

## 2011-07-05 NOTE — Patient Instructions (Signed)
[facility] Discharge Instructions for Patients Receiving Chemotherapy  Today you received the following chemotherapy agents taxol, carboplatin  To help prevent nausea and vomiting after your treatment, we encourage you to take your nausea medication as prescribed by MD Begin taking it at 4pm and take it as often as prescribed for the next 48 hours.   If you develop nausea and vomiting that is not controlled by your nausea medication, call the clinic. If it is after clinic hours your family physician or the after hours number for the clinic or go to the Emergency Department.   BELOW ARE SYMPTOMS THAT SHOULD BE REPORTED IMMEDIATELY:  *FEVER GREATER THAN 100.5 F  *CHILLS WITH OR WITHOUT FEVER  NAUSEA AND VOMITING THAT IS NOT CONTROLLED WITH YOUR NAUSEA MEDICATION  *UNUSUAL SHORTNESS OF BREATH  *UNUSUAL BRUISING OR BLEEDING  TENDERNESS IN MOUTH AND THROAT WITH OR WITHOUT PRESENCE OF ULCERS  *URINARY PROBLEMS  *BOWEL PROBLEMS  UNUSUAL RASH Items with * indicate a potential emergency and should be followed up as soon as possible.  One of the nurses will contact you 24 hours after your treatment. Please let the nurse know about any problems that you may have experienced. Feel free to call the clinic you have any questions or concerns. The clinic phone number is [phone].   I have been informed and understand all the instructions given to me. I know to contact the clinic, my physician, or go to the Emergency Department if any problems should occur. I do not have any questions at this time, but understand that I may call the clinic during office hours   should I have any questions or need assistance in obtaining follow up care.    __________________________________________  _____________  __________ Signature of Patient or Authorized Representative            Date                   Time    __________________________________________ Nurse's Signature

## 2011-07-05 NOTE — Progress Notes (Signed)
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   CURRENT THERAPY: started definitive concurrent chemoradiation with weekly Carboplatin/Taxol on 06/14/2011; and daily XRT 06/21/11.   INTERVAL HISTORY: Jose [name] 78 y.o. male returns to clinic with his wife. Reports that he is not able to eat by mouth due to difficulty swallowing.  He has pain in his throat. Requesting liquid pain mediation. Continues to do swallowing exercises. Can swallow small amounts of liquid. Using Osmolite ~2cans/day through PEG and Ensure 1-2 times per day. His stamina is stable.  He has baseline fatigue even before starting chemoradiation; but his fatigue has not worsened.  He has developed dry mouth.  He is doing mouth rinse with salt/baking soda but has not started diluted H2O2.  PEG is without purulent discharge or erythema.   Patient denies fever, headache, visual changes, confusion, drenching night sweats, palpable lymph node swelling, nausea, vomiting, jaundice, chest pain, palpitation, shortness of breath, dyspnea on exertion, productive cough, gum bleeding, epistaxis, hematemesis, hemoptysis, abdominal pain, abdominal swelling, early satiety, melena, hematochezia, hematuria, skin rash, spontaneous bleeding, joint swelling, joint pain, heat or cold intolerance, bowel bladder incontinence, back pain, focal motor weakness, paresthesia, depression, suicidal or homocidal ideation, feeling hopelessness.    Past Medical History  Diagnosis Date  . Blood transfusion     2006 had 4 units  . Arthritis     knees,back,shoulders  . Anxiety   . Right ureteral stone S/P URETERAL STENT 01-24-11  AND ESWL  03-01-11  . History of acute renal failure 01-23-11    DUE TO HYDRONEPHROSIS AND RIGHT STONE  . Normal cardiac stress test  09-25-2007  . Glaucoma   . Cataract immature   . Diverticulosis of colon   . History of GI diverticular bleed   . Hyperlipemia   . Impaired hearing NOT WEARING HIS BILATERL AIDS  . Urgency of urination     DUE TO URETERAL STONE AND STENT  . Frequency of urination   . Insomnia   . Anemia   . Chronic kidney disease     kidney stones  . GERD (gastroesophageal reflux disease)   . Hernia     inguinal  . Headache     migraines  . Glaucoma   . Hearing impairment     bilateral  . Trigeminal neuralgia   . Hypertension   . Male breast cancer 1988    S/P RIGHT MASTECTOMY W/ NODE DISSECTION AND CHEMO  . 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 Skin cancer 2002    melanoma- back    Past Surgical History  Procedure Date  . Cystoscopy w/ ureteral stent placement 01/24/2011    Procedure: CYSTOSCOPY WITH RETROGRADE PYELOGRAM/URETERAL STENT PLACEMENT;  Surgeon: Antony Haste, MD;  Location: [facility];  Service: Urology;  Laterality: Right;  . Right leg surg for fx AGE 59    patient states left thigh  . Craniectomy suboccipital for exploration / decompression cranial nerves 2003    5TH CRANIAL NERVE DECOMPRESSION  . Simple mastectomy 1988    MALE--- RIGHT BREAST W/ NODE DISSECTION  . Melanoma excision 10 YRS AGO    BACK AREA  . Extracorporeal shock wave lithotripsy 03-01-11    RIGHT  . Ureteroscopy 03/26/2011    Procedure: URETEROSCOPY;  Surgeon:  Antony Haste, MD;  Location: [facility];  Service: Urology;  Laterality: Right;  RIGHT URETEROSCOPY, HOLMIUM LASER AND STENT C-ARM HOLMIUM LASER  . Examination under anesthesia 03/26/2011    Procedure: EXAM UNDER ANESTHESIA;  Surgeon: Antony Haste, MD;  Location: [facility];  Service: Urology;  Laterality: N/A;  . Lymph node biopsy 04/21/2011    Procedure: LYMPH NODE BIOPSY;  Surgeon: Susy Frizzle, MD;  Location: [facility] OR;  Service: ENT;  Laterality: Right;    Current  Outpatient Prescriptions  Medication Sig Dispense Refill  . amLODipine (NORVASC) 10 MG tablet Take 10 mg by mouth every morning.       . brimonidine (ALPHAGAN) 0.2 % ophthalmic solution Place 1 drop into both eyes 2 (two) times daily.  5 mL    . gabapentin (NEURONTIN) 300 MG capsule Take 600 mg by mouth 2 (two) times daily before lunch and supper.       Marland Kitchen LORazepam (ATIVAN) 0.5 MG tablet Take 0.5 mg by mouth 2 (two) times daily as needed. ANXIETY       . Multiple Vitamins-Minerals (MULTIVITAMIN WITH MINERALS) tablet Take 1 tablet by mouth daily.       . Nutritional Supplements (FEEDING SUPPLEMENT, OSMOLITE 1.5 CAL,) LIQD Begin one can of Osmolite 1.5 via feeding tube QID at  0600, 1000, 1400, and 2000 daily.  Flush with 60 ml free water before and after each bolus feeding as tolerated.  Please send supplies, formula and RN for teaching.  948 mL  0  . ondansetron (ZOFRAN) 8 MG tablet Take 1 tab two times a day starting the day after chemo for 3 days. Then take 1 tab two times a day as needed for nausea or vomiting.  30 tablet  1  . oxyCODONE (OXYCONTIN) 20 MG 12 hr tablet Take 20 mg by mouth every 12 (twelve) hours as needed. For pain      . oxymetazoline (AFRIN) 0.05 % nasal spray Place 1 spray into the nose 2 (two) times daily as needed. ALLERGIES       . prochlorperazine (COMPAZINE) 10 MG tablet Take 1 tablet (10 mg total) by mouth every 6 (six) hours as needed (Nausea or vomiting).  30 tablet  1  . traMADol (ULTRAM) 50 MG tablet Take 50 mg by mouth daily as needed. Maximum dose= 8 tablets per day For pain      . Travoprost, BAK Free, (TRAVATAMN) 0.004 % SOLN ophthalmic solution Place 1 drop into both eyes at bedtime.      . vitamin C (ASCORBIC ACID) 500 MG tablet Take 1,000 mg by mouth daily.       . vitamin E 400 UNIT capsule Take 400 Units by mouth daily.       Marland Kitchen HYDROcodone-acetaminophen (LORTAB) 7.5-500 MG/15ML solution Take 15 mLs by mouth every 6 (six) hours as needed for pain.  480 mL  0   . DISCONTD: amLODipine (NORVASC) 10 MG tablet Take 1 tablet (10 mg total) by mouth daily.  30 tablet  0    ALLERGIES:  is allergic to hydromorphone; morphine and related; and codeine.  REVIEW OF SYSTEMS:  The rest of the 14-point review of system was negative.   Filed Vitals:   07/05/11 0842  BP: 129/65  Pulse: 63  Temp: 97 F (36.1 C)   Wt Readings from Last 3 Encounters:  07/05/11 171 lb 8 oz (77.792 kg)  07/01/11 174 lb 4.8 oz (79.062 kg)  06/28/11 173 lb  12.8 oz (78.835 kg)   ECOG Performance status: 1  PHYSICAL EXAMINATION:   General: well-nourished in no acute distress. Eyes: no scleral icterus. ENT: There was right base of tongue fullness on visual exam.  Neck was without thyromegaly. Lymphatics: Positive for a 0.5 cm left cervical node.  I could not feel right cervical node on today exam. There was no supraclavicular or axillary adenopathy. Respiratory: lungs were clear bilaterally without wheezing or crackles. Cardiovascular: Regular rate and rhythm, S1/S2, without murmur, rub or gallop. There was no pedal edema. GI: abdomen was soft, flat, nontender, nondistended, without organomegaly.  PEG tube was dry, clean, intact.  Muscoloskeletal: no spinal tenderness of palpation of vertebral spine. Skin exam was without echymosis, petichae. Neuro exam was nonfocal. Patient was able to get on and off exam table without assistance. Gait was normal. Patient was alerted and oriented. Attention was good. Language was appropriate. Mood was normal without depression. Speech was not pressured. Thought content was not tangential.    LABORATORY:   WBC 3.4, ANC 2.2, Hgb 11.5, Plt 212,000 Cr 1.34 on 06/28/11  ASSESSMENT AND PLAN:   1.  HLP:  He is on diet control. 2.  HTN:  He is on Amlodipine.  3.  Trigeminal neuralgia:  He is on neurontin and tramadol. 4.  History of smoking:  He quit in 1982.  5.  History of kidney stone in 2012 s/p stent. 6.  History of male breast cancer in 1989:    S/p mastectomy; s/p adjuvant chemoradiation; s/p 5 years of adjuvant Tamoxifen in distant past.  He is in remission from this cancer. 7.  Chronic Kidney disease:  Stable Cr. CMET pending today. 8.  Right lung nodule uptake:  Low suspicion for met or lung cancer at this time.  He has no evidence of active pneumonia.  Will pay attention to this on follow up PET.  9.  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  He is s/p 3 weekly doses of Carboplatin/Taxol with grade 1 fatigue; grade 1 anorexia; and weight loss.  There is no indication for dose reduction yet.  I advised him to continue with weekly Carboplatin/Taxol and daily XRT.  10.  Mucositis prevention:  I advised him to continue with salt/baking soda mouth rinse alternating with diluted H2O2 (1:4 dilution) to thin out thick phlegm. Written instructions were given to the patient and his wife regarding mouth rinses.  11.  Mild calorie-protein malnutrition:  Unable to swallow. He has PEG tube. I have reviewed Dietician recommendations with the patient and wife. Encouraged him to use Osmolite QID with Ensure in between as tolerated.  12.  Nausea/vomiting prophy:  He has compazine/zofran/ativan prn. 13. Pain. Now unable to swallow. On Oxycontin and able to swallow this. Lortab elixir prescription given today. May need to consider D/C of Oxycontin and switch to Fentanyl patch. 14.  Follow up:  Weekly lab/RV and chemo.    The patient was seen and examined by Dr Gaylyn Rong. >90% of plan of care developed by Dr Gaylyn Rong. The length of time of the face-to-face encounter was 30 minutes. More than 50% of time was spent counseling and coordination of care.

## 2011-07-05 NOTE — Progress Notes (Signed)
I spoke with Mr. and Mrs. Melberg in the chemotherapy area.  His weight has declined to 171.5 pounds from 173.8 pounds last week.  He is no longer eating or drinking by mouth.  He is using Osmolite 1.5 through his feeding tube.  However, he has only been using maybe 3 cans a day. He complains of bloating on occasion.  NUTRITION DIAGNOSIS:  Inadequate oral intake continues.    New nutrition diagnosis of less than optimal enteral nutrition related to decreased oral intake as evidenced by continued weight loss of an additional 2 pounds this past week.  INTERVENTION:  I have reinforced the importance of increasing tube feedings to meet 100% of patient's estimated need.  Tube feeding orders were written to increase Osmolite 1.5 via feeding tube to 1-1/2 cans q.i.d. with 60 mL of free water before and after each bolus feeding. The patient is to continue to drink water by mouth as tolerated. However, I do request that he provide 240 mL of water flushes via feeding tube  t.i.d. between feedings.  Tube feedings will provide 2130 calories, 89 mg of protein and approximately 2286 mL of free water.  I have educated and written these instructions out for the patient and wife.  They verbalized understanding.  New tube feeding orders were written and will be faxed to [facility].  MONITORING, EVALUATION AND GOALS:  The patient has been unable to tolerate his oral diet and has continued to have weight loss.  He will now tolerate tube feedings to meet 100% of his estimated needs to promote weight maintenance.  NEXT VISIT:  Monday, April 22nd, during chemotherapy.    ______________________________ Zenovia Jarred, RD, LDN Clinical Nutrition Specialist BN/MEDQ  D:  07/05/2011  T:  07/05/2011  Job:  [id number]

## 2011-07-06 ENCOUNTER — Ambulatory Visit
Admission: RE | Admit: 2011-07-06 | Discharge: 2011-07-06 | Disposition: A | Discharge status: Home / Self Care | Payer: Medicare Other | Source: Ambulatory Visit | Attending: Radiation Oncology | Admitting: Radiation Oncology

## 2011-07-06 ENCOUNTER — Ambulatory Visit (HOSPITAL_COMMUNITY): Payer: Self-pay | Admitting: Dentistry

## 2011-07-06 VITALS — BP 128/67 | HR 57 | Temp 97.3°F

## 2011-07-06 DIAGNOSIS — R1310 Dysphagia, unspecified: Secondary | ICD-10-CM

## 2011-07-06 DIAGNOSIS — K08199 Complete loss of teeth due to other specified cause, unspecified class: Secondary | ICD-10-CM

## 2011-07-06 DIAGNOSIS — R432 Parageusia: Secondary | ICD-10-CM

## 2011-07-06 DIAGNOSIS — R634 Abnormal weight loss: Secondary | ICD-10-CM

## 2011-07-06 DIAGNOSIS — C01 Malignant neoplasm of base of tongue: Secondary | ICD-10-CM

## 2011-07-06 DIAGNOSIS — Z09 Encounter for follow-up examination after completed treatment for conditions other than malignant neoplasm: Secondary | ICD-10-CM

## 2011-07-06 NOTE — Progress Notes (Signed)
Tuesday, July 06, 2011   BP:  128/67                  P:  57            T:  97.3           Wgt:174 lbs now. Patient was 188 pounds at start of the treatment.  Jose [name] is an 78 year old male recently diagnosed with squamous cell carcinoma right base of tongue. Patient underwent extraction remaining teeth with alveoloplasty with Dr. Chales Salmon on 06/02/2011.  Patient now undergoing active chemoradiation therapy. Patient presents for a periodic oral examination during radiation therapy.   Patient has completed 11 of 30 radiation treatments.  REVIEW OF CHIEF COMPLAINTS:  DRY MOUTH: Yes.  HARD TO SWALLOW: Yes HURT TO SWALLOW: Yes TASTE CHANGES: Patient has no taste at all. SORES IN MOUTH: No TRISMUS SYMPTOMS: Having a little problem with trismus. Made trismus device today. SYMPTOM RELIEF:  Using salt water and baking soda rinses. HOME ORAL HYGIENE REGIMEN: Patient is now edentulous. Extraction sites appear to be healing in well with generalized primary closure. Lower right quadrant extraction sites are healing in by secondary intention. BRUSHING: Patient instructed to brush tongue daily. Patient was given a red chemobrush for this procedure. TRISMUS EXERCISES: Maximim interincisal opening: 40 mm. Trismus device fabricated with 25 Sticks at 40 mm.  DENTAL EXAM: ORAL HYGIENE(PLAQUE): Patient is now edentulous. Minimal plaque noted on the tongue. LOCATION OF MUCOSITIS: None noted. DESCRIPTION OF SALIVA: Decreased. Mild to moderate xerostomia noted. ANY EXPOSED BONE: No bone exposed at this time. Lower right extraction sites are healing in by secondary intention. OTHER WATCHED AREAS: Lower right quadrant extraction site in the area of the previously impacted tooth #32. DIAGNOSES: 1.  Xerostomia  2.  Dysgeusia 3.  Dysphagia 4.  Odynophagia 5.  Weight Loss  RECOMMENDATIONS: 1. Brush tongue daily. Use salt water rinses as needed to aid healing. 2. Use trismus exercises as directed. 3. Use  Biotene Rinse or salt water/baking soda rinses. 4. Multiple sips of water as needed. 5. RTC in two months for periodic oral examination status post radiation therapy . Call if problems before then. Dr. Kristin Bruins

## 2011-07-07 ENCOUNTER — Ambulatory Visit
Admission: RE | Admit: 2011-07-07 | Discharge: 2011-07-07 | Disposition: A | Discharge status: Home / Self Care | Payer: Medicare Other | Source: Ambulatory Visit | Attending: Radiation Oncology | Admitting: Radiation Oncology

## 2011-07-08 ENCOUNTER — Ambulatory Visit
Admission: RE | Admit: 2011-07-08 | Discharge: 2011-07-08 | Disposition: A | Discharge status: Home / Self Care | Payer: Medicare Other | Source: Ambulatory Visit | Attending: Radiation Oncology | Admitting: Radiation Oncology

## 2011-07-08 ENCOUNTER — Encounter: Payer: Self-pay | Admitting: Radiation Oncology

## 2011-07-08 VITALS — BP 126/71 | HR 65 | Temp 97.8°F | Resp 20 | Wt 171.1 lb

## 2011-07-08 DIAGNOSIS — C01 Malignant neoplasm of base of tongue: Secondary | ICD-10-CM

## 2011-07-08 MED ORDER — BIAFINE EX EMUL
CUTANEOUS | Status: DC | PRN
  Administered 2011-07-08: 1 via TOPICAL

## 2011-07-08 NOTE — Progress Notes (Signed)
Department of Radiation Oncology  Phone:  [phone] Fax:        [phone]  Weekly Treatment Note    Name: Jose [name] Date: 07/08/2011 MRN: [id number] DOB: [birth year]   Current dose: 29.7 gray  Current fraction: 14   MEDICATIONS: Current Outpatient Prescriptions  Medication Sig Dispense Refill  . amLODipine (NORVASC) 10 MG tablet Take 10 mg by mouth every morning.       . brimonidine (ALPHAGAN) 0.2 % ophthalmic solution Place 1 drop into both eyes 2 (two) times daily.  5 mL    . gabapentin (NEURONTIN) 300 MG capsule Take 600 mg by mouth 2 (two) times daily before lunch and supper.       Marland Kitchen HYDROcodone-acetaminophen (LORTAB) 7.5-500 MG/15ML solution Take 15 mLs by mouth every 6 (six) hours as needed for pain.  480 mL  0  . LORazepam (ATIVAN) 0.5 MG tablet Take 0.5 mg by mouth 2 (two) times daily as needed. ANXIETY       . Multiple Vitamins-Minerals (MULTIVITAMIN WITH MINERALS) tablet Take 1 tablet by mouth daily.       . Nutritional Supplements (FEEDING SUPPLEMENT, OSMOLITE 1.5 CAL,) LIQD Increase Osmolite 1.5 via feeding tube to 1.5 cans QID with 60 cc water before and after each bolus TF.  Add 240 cc free water TID between feedings as tolerated.  1422 mL  0  . ondansetron (ZOFRAN) 8 MG tablet Take 1 tab two times a day starting the day after chemo for 3 days. Then take 1 tab two times a day as needed for nausea or vomiting.  30 tablet  1  . oxyCODONE (OXYCONTIN) 20 MG 12 hr tablet Take 20 mg by mouth every 12 (twelve) hours as needed. For pain      . oxymetazoline (AFRIN) 0.05 % nasal spray Place 1 spray into the nose 2 (two) times daily as needed. ALLERGIES       . prochlorperazine (COMPAZINE) 10 MG tablet Take 1 tablet (10 mg total) by mouth every 6 (six) hours as needed (Nausea or vomiting).  30 tablet  1  . traMADol (ULTRAM) 50 MG tablet Take 50 mg by mouth daily as needed. Maximum dose= 8 tablets per day For pain      . Travoprost, BAK Free, (TRAVATAMN) 0.004 %  SOLN ophthalmic solution Place 1 drop into both eyes at bedtime.      . vitamin C (ASCORBIC ACID) 500 MG tablet Take 1,000 mg by mouth daily.       . vitamin E 400 UNIT capsule Take 400 Units by mouth daily.       Marland Kitchen DISCONTD: amLODipine (NORVASC) 10 MG tablet Take 1 tablet (10 mg total) by mouth daily.  30 tablet  0   Current Facility-Administered Medications  Medication Dose Route Frequency Provider Last Rate Last Dose  . topical emolient (BIAFINE) emulsion   Topical PRN Jonna Coup, MD   1 application at 07/08/11 1145     ALLERGIES: Hydromorphone; Morphine and related; and Codeine   LABORATORY DATA:  Lab Results  Component Value Date   WBC 3.4* 07/05/2011   HGB 11.5* 07/05/2011   HCT 33.7* 07/05/2011   MCV 86.4 07/05/2011   PLT 212 07/05/2011   Lab Results  Component Value Date   NA 138 07/05/2011   K 4.7 07/05/2011   CL 105 07/05/2011   CO2 27 07/05/2011   Lab Results  Component Value Date   ALT 24 07/05/2011   AST 18 07/05/2011  ALKPHOS 75 07/05/2011   BILITOT 0.4 07/05/2011     NARRATIVE: Jose [name] was seen today for weekly treatment management. The chart was checked and the patient's films were reviewed. Patient is complaining of such problems of some ongoing difficulty swallowing, oral cavity discomfort, and change in taste. He is to pick up liquid Lortab today which has been prescribed for him. The patient really has not swallowing much at this point. He is taking in most of his nutrition by the feeding tube and he is met with the nutritionist to go over a plan for this. The patient's weight is down a little bit today I talked about this with him in addition to my recommendation for him to continue to swallow as much as possible by mouth. He expressed understanding of this.  PHYSICAL EXAMINATION: weight is 171 lb 1.6 oz (77.61 kg). His oral temperature is 97.8 F (36.6 C). His blood pressure is 126/71 and his pulse is 65. His respiration is 20.      the patient's skin  looks good with some developing erythema. He decide is developing in the oral cavity especially posteriorly. No thrush present  ASSESSMENT: The patient is doing satisfactorily with treatment.  PLAN: We will continue with the patient's radiation treatment as planned. No major changes today. The patient has medications which she's been using ongoing as well as some rinses.

## 2011-07-08 NOTE — Progress Notes (Signed)
Pt c/o painful swallowing, has new script for Lortab elixir; will be picked up today.  No pain otherwise. Takes med for nausea 2-3 x/day w/good relief.  Osmolite 4-5 cans/day plus H2O. Pt seeing dietician regularly. Pt not taking in any po's. Biafine to skin in tx area.

## 2011-07-09 ENCOUNTER — Ambulatory Visit
Admission: RE | Admit: 2011-07-09 | Discharge: 2011-07-09 | Disposition: A | Discharge status: Home / Self Care | Payer: Medicare Other | Source: Ambulatory Visit | Attending: Radiation Oncology | Admitting: Radiation Oncology

## 2011-07-12 ENCOUNTER — Ambulatory Visit
Admission: RE | Admit: 2011-07-12 | Discharge: 2011-07-12 | Disposition: A | Discharge status: Home / Self Care | Payer: Medicare Other | Source: Ambulatory Visit | Attending: Radiation Oncology | Admitting: Radiation Oncology

## 2011-07-12 ENCOUNTER — Ambulatory Visit: Payer: Self-pay

## 2011-07-12 ENCOUNTER — Other Ambulatory Visit (HOSPITAL_BASED_OUTPATIENT_CLINIC_OR_DEPARTMENT_OTHER): Payer: Self-pay | Admitting: Lab

## 2011-07-12 ENCOUNTER — Other Ambulatory Visit: Payer: Self-pay | Admitting: Lab

## 2011-07-12 ENCOUNTER — Ambulatory Visit (HOSPITAL_BASED_OUTPATIENT_CLINIC_OR_DEPARTMENT_OTHER): Payer: Medicare Other | Admitting: Oncology

## 2011-07-12 ENCOUNTER — Ambulatory Visit: Payer: Medicare Other | Admitting: Nutrition

## 2011-07-12 ENCOUNTER — Encounter: Payer: Self-pay | Admitting: Oncology

## 2011-07-12 VITALS — BP 124/54 | HR 74 | Temp 98.4°F | Ht 72.0 in | Wt 171.7 lb

## 2011-07-12 DIAGNOSIS — N189 Chronic kidney disease, unspecified: Secondary | ICD-10-CM

## 2011-07-12 DIAGNOSIS — C01 Malignant neoplasm of base of tongue: Secondary | ICD-10-CM

## 2011-07-12 DIAGNOSIS — C029 Malignant neoplasm of tongue, unspecified: Secondary | ICD-10-CM

## 2011-07-12 DIAGNOSIS — C109 Malignant neoplasm of oropharynx, unspecified: Secondary | ICD-10-CM

## 2011-07-12 DIAGNOSIS — Z853 Personal history of malignant neoplasm of breast: Secondary | ICD-10-CM

## 2011-07-12 LAB — CBC WITH DIFFERENTIAL/PLATELET
Basophils Absolute: 0.1 10*3/uL (ref 0.0–0.1)
EOS%: 0.3 % (ref 0.0–7.0)
HGB: 11.2 g/dL — ABNORMAL LOW (ref 13.0–17.1)
MCH: 29.6 pg (ref 27.2–33.4)
NEUT#: 2.2 10*3/uL (ref 1.5–6.5)
RDW: 13.7 % (ref 11.0–14.6)
lymph#: 0.4 10*3/uL — ABNORMAL LOW (ref 0.9–3.3)

## 2011-07-12 MED ORDER — FENTANYL 12 MCG/HR TD PT72: 1.0000 | MEDICATED_PATCH | TRANSDERMAL | Status: DC

## 2011-07-12 NOTE — Progress Notes (Signed)
Mr. and Mrs. Jose [name] present to nutrition followup.  He complains of fatigue.  His weight is stable at 171.7 pounds today from 171.5 pounds several weeks ago. His glucose was noted to be 147.  He reports compliance with 1-1/2 cans of Osmolite 1.5 q.i.d. with 60 mL of free water before and after each bolus.  This is an improvement over the last time I spoke with him.  However, at that time I had also requested that he add 240 mL of free water t.i.d. between feedings to provide additional hydration.  The patient has not been compliant with that recommendation.  NUTRITION DIAGNOSIS:  Inadequate oral intake does continue.  Diagnosis of less than optimal enteral nutrition infusion has improved.  INTERVENTION:  The patient is to continue Osmolite 1.5 1-1/2 cans q.i.d. with 60 mL of free water before and after each bolus feeding.  In addition, he is to add 240 mL of free water flushes t.i.d. Between feedings.  This will provide 2130 calories, 89 mg of protein and approximately 2286 mL of free water.  I have again educated and written out these instructions for the patient and wife.  I have stressed the importance of additional free water, not only to help with meeting free water needs but to improve bowels and hopefully fatigue. The patient verbalizes understanding.  MONITORING/EVALUATION/GOALS:  The patient has tolerated increase in tube feeding, but not free water flushes.  He will increase free water flushes 240 mL t.i.d.  NEXT VISIT:  Brief followup on Monday, April 29th during chemotherapy.    ______________________________ Zenovia Jarred, RD, LDN Clinical Nutrition Specialist BN/MEDQ  D:  07/12/2011  T:  07/12/2011  Job:  [id number]

## 2011-07-12 NOTE — Progress Notes (Signed)
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   CURRENT THERAPY: started definitive concurrent chemoradiation with weekly Carboplatin/Taxol on 06/14/2011; and daily XRT 06/21/11.   INTERVAL HISTORY: Jose [name] 78 y.o. male returns to clinic with his wife. Reports that he is not able to eat by mouth due to difficulty swallowing.  He has pain in his throat. Using Lortab 2-3 times per day. Unable to swallow Oxycontin at this time. Continues to do swallowing exercises. Can swallow small amounts of liquid. Using Osmolite 4-5 cans/day through PEG. Receiving ~1/2 liter of water daily. His stamina is stable.  He has baseline fatigue even before starting chemoradiation; but his fatigue has not worsened.  He has developed dry mouth.  He is doing mouth rinses with salt/baking soda and diluted H2O2.  Has had some bleeding from his mouth. PEG is without purulent discharge or erythema.   Patient denies fever, headache, visual changes, confusion, drenching night sweats, palpable lymph node swelling, nausea, vomiting, jaundice, chest pain, palpitation, shortness of breath, dyspnea on exertion, productive cough, gum bleeding, epistaxis, hematemesis, hemoptysis, abdominal pain, abdominal swelling, early satiety, melena, hematochezia, hematuria, skin rash, spontaneous bleeding, joint swelling, joint pain, heat or cold intolerance, bowel bladder incontinence, back pain, focal motor weakness, paresthesia, depression, suicidal or homocidal ideation, feeling hopelessness.    Past Medical History  Diagnosis Date  . Blood transfusion     2006 had 4 units  . Arthritis     knees,back,shoulders  . Anxiety   . Right ureteral stone S/P URETERAL STENT 01-24-11  AND ESWL  03-01-11  . History of acute renal failure 01-23-11    DUE TO HYDRONEPHROSIS AND RIGHT STONE  . Normal cardiac stress test 09-25-2007  . Glaucoma   . Cataract immature   . Diverticulosis of colon   . History of GI diverticular bleed   . Hyperlipemia   . Impaired hearing NOT WEARING HIS BILATERL AIDS  . Urgency of urination     DUE TO URETERAL STONE AND STENT  . Frequency of urination   . Insomnia   . Anemia   . Chronic kidney disease     kidney stones  . GERD (gastroesophageal reflux disease)   . Hernia     inguinal  . Headache     migraines  . Glaucoma   . Hearing impairment     bilateral  . Trigeminal neuralgia   . Hypertension   . Male breast cancer 1988    S/P RIGHT MASTECTOMY W/ NODE DISSECTION AND CHEMO  . 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 Skin cancer 2002    melanoma- back    Past Surgical History  Procedure Date  . Cystoscopy w/ ureteral stent placement 01/24/2011    Procedure: CYSTOSCOPY WITH RETROGRADE PYELOGRAM/URETERAL STENT PLACEMENT;  Surgeon: Antony Haste, MD;  Location: [facility];  Service: Urology;  Laterality: Right;  . Right leg surg for fx AGE 89    patient states left thigh  . Craniectomy suboccipital for exploration / decompression cranial nerves 2003    5TH CRANIAL NERVE DECOMPRESSION  . Simple mastectomy 1988    MALE--- RIGHT BREAST W/ NODE DISSECTION  . Melanoma excision 10 YRS AGO    BACK AREA  . Extracorporeal shock wave lithotripsy 03-01-11  RIGHT  . Ureteroscopy 03/26/2011    Procedure: URETEROSCOPY;  Surgeon: Antony Haste, MD;  Location: [facility];  Service: Urology;  Laterality: Right;  RIGHT URETEROSCOPY, HOLMIUM LASER AND STENT C-ARM HOLMIUM LASER  . Examination under anesthesia 03/26/2011    Procedure: EXAM UNDER ANESTHESIA;  Surgeon: Antony Haste, MD;  Location: [facility];  Service: Urology;  Laterality: N/A;  . Lymph node biopsy 04/21/2011    Procedure: LYMPH NODE BIOPSY;  Surgeon: Susy Frizzle, MD;   Location: [facility];  Service: ENT;  Laterality: Right;    Current Outpatient Prescriptions  Medication Sig Dispense Refill  . amLODipine (NORVASC) 10 MG tablet Take 10 mg by mouth every morning.       . brimonidine (ALPHAGAN) 0.2 % ophthalmic solution Place 1 drop into both eyes 2 (two) times daily.  5 mL    . fentaNYL (DURAGESIC - DOSED MCG/HR) 12 MCG/HR Place 1 patch (12.5 mcg total) onto the skin every 3 (three) days.  10 patch  0  . gabapentin (NEURONTIN) 300 MG capsule Take 600 mg by mouth 2 (two) times daily before lunch and supper.       Marland Kitchen HYDROcodone-acetaminophen (LORTAB) 7.5-500 MG/15ML solution Take 15 mLs by mouth every 6 (six) hours as needed for pain.  480 mL  0  . LORazepam (ATIVAN) 0.5 MG tablet Take 0.5 mg by mouth 2 (two) times daily as needed. ANXIETY       . Multiple Vitamins-Minerals (MULTIVITAMIN WITH MINERALS) tablet Take 1 tablet by mouth daily.       . Nutritional Supplements (FEEDING SUPPLEMENT, OSMOLITE 1.5 CAL,) LIQD Increase Osmolite 1.5 via feeding tube to 1.5 cans QID with 60 cc water before and after each bolus TF.  Add 240 cc free water TID between feedings as tolerated.  1422 mL  0  . ondansetron (ZOFRAN) 8 MG tablet Take 1 tab two times a day starting the day after chemo for 3 days. Then take 1 tab two times a day as needed for nausea or vomiting.  30 tablet  1  . oxymetazoline (AFRIN) 0.05 % nasal spray Place 1 spray into the nose 2 (two) times daily as needed. ALLERGIES       . prochlorperazine (COMPAZINE) 10 MG tablet Take 1 tablet (10 mg total) by mouth every 6 (six) hours as needed (Nausea or vomiting).  30 tablet  1  . traMADol (ULTRAM) 50 MG tablet Take 50 mg by mouth daily as needed. Maximum dose= 8 tablets per day For pain      . Travoprost, BAK Free, (TRAVATAMN) 0.004 % SOLN ophthalmic solution Place 1 drop into both eyes at bedtime.      . vitamin C (ASCORBIC ACID) 500 MG tablet Take 1,000 mg by mouth daily.       . vitamin E 400 UNIT capsule Take 400  Units by mouth daily.       Marland Kitchen DISCONTD: amLODipine (NORVASC) 10 MG tablet Take 1 tablet (10 mg total) by mouth daily.  30 tablet  0    ALLERGIES:  is allergic to hydromorphone; morphine and related; and codeine.  REVIEW OF SYSTEMS:  The rest of the 14-point review of system was negative.   Filed Vitals:   07/12/11 0930  BP: 124/54  Pulse: 74  Temp: 98.4 F (36.9 C)   Wt Readings from Last 3 Encounters:  07/12/11 171 lb 11.2 oz (77.883 kg)  07/08/11 171 lb 1.6 oz (77.61 kg)  07/05/11  171 lb 8 oz (77.792 kg)   ECOG Performance status: 1  PHYSICAL EXAMINATION:   General: well-nourished in no acute distress. Eyes: no scleral icterus. ENT: There was right base of tongue fullness on visual exam. Small amount of fresh blood on tongue and in posterior pharynx. Neck was without thyromegaly. Lymphatics: No palpable cervical lymphadenopathy. There was no supraclavicular or axillary adenopathy. Respiratory: lungs were clear bilaterally without wheezing or crackles. Cardiovascular: Regular rate and rhythm, S1/S2, without murmur, rub or gallop. There was no pedal edema. GI: abdomen was soft, flat, nontender, nondistended, without organomegaly.  PEG tube was dry, clean, intact.  Muscoloskeletal: no spinal tenderness of palpation of vertebral spine. Skin exam was without echymosis, petichae. Neuro exam was nonfocal. Patient was able to get on and off exam table without assistance. Gait was normal. Patient was alerted and oriented. Attention was good. Language was appropriate. Mood was normal without depression. Speech was not pressured. Thought content was not tangential.    LABORATORY:   WBC 3.0, ANC 2.2, Hgb 11.2, Plt 168,000 Cr 1.33 on 07/05/11  ASSESSMENT AND PLAN:   1.  HLP:  He is on diet control. 2.  HTN:  He is on Amlodipine.  3.  Trigeminal neuralgia:  He is on neurontin and tramadol. 4.  History of smoking:  He quit in 1982.  5.  History of kidney stone in 2012 s/p stent. 6.  History  of male breast cancer in 1989:   S/p mastectomy; s/p adjuvant chemoradiation; s/p 5 years of adjuvant Tamoxifen in distant past.  He is in remission from this cancer. 7.  Chronic Kidney disease:  Stable Cr.  8.  Right lung nodule uptake:  Low suspicion for met or lung cancer at this time.  He has no evidence of active pneumonia.  Will pay attention to this on follow up PET.  9.  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  He is s/p 4 weekly doses of Carboplatin/Taxol with grade 1 fatigue; grade 1 anorexia; and weight loss. He is here for cycle 5 today. He has developed grade 2 mucositis. Will hold his chemotherapy today. Encouraged him to continue XRT as scheduled. Will re-evaluate him next week to resume chemotherapy.   10.  Mucositis prevention:  I advised him to continue with salt/baking soda mouth rinse alternating with diluted H2O2 (1:4 dilution) to thin out thick phlegm. 11.  Mild calorie-protein malnutrition:  Unable to swallow. He has PEG tube. I have reviewed Dietician recommendations with the patient and wife. Encouraged him to use Osmolite QID. He will be seen by dietician today to discuss increasing amount of free fluid. 12.  Nausea/vomiting prophy:  He has compazine/zofran/ativan prn. 13. Pain. Now unable to swallow.  Unable to take Oxycontin. Prescription for Fentanyl patch given. Continue Lortab PRN. 14.  Follow up:  Weekly lab/RV and chemo.    The patient was seen and examined by Dr Gaylyn Rong. >90% of plan of care developed by Dr Gaylyn Rong. The length of time of the face-to-face encounter was 20 minutes. More than 50% of time was spent counseling and coordination of care.

## 2011-07-13 ENCOUNTER — Encounter: Payer: Self-pay | Admitting: *Deleted

## 2011-07-13 ENCOUNTER — Ambulatory Visit
Admission: RE | Admit: 2011-07-13 | Discharge: 2011-07-13 | Disposition: A | Discharge status: Home / Self Care | Payer: Medicare Other | Source: Ambulatory Visit | Attending: Radiation Oncology | Admitting: Radiation Oncology

## 2011-07-14 ENCOUNTER — Encounter: Payer: Self-pay | Admitting: Medical Oncology

## 2011-07-14 ENCOUNTER — Ambulatory Visit
Admission: RE | Admit: 2011-07-14 | Discharge: 2011-07-14 | Disposition: A | Discharge status: Home / Self Care | Payer: Medicare Other | Source: Ambulatory Visit | Attending: Radiation Oncology | Admitting: Radiation Oncology

## 2011-07-15 ENCOUNTER — Ambulatory Visit
Admission: RE | Admit: 2011-07-15 | Discharge: 2011-07-15 | Disposition: A | Discharge status: Home / Self Care | Payer: Medicare Other | Source: Ambulatory Visit | Attending: Radiation Oncology | Admitting: Radiation Oncology

## 2011-07-15 VITALS — Wt 172.3 lb

## 2011-07-15 DIAGNOSIS — C01 Malignant neoplasm of base of tongue: Secondary | ICD-10-CM

## 2011-07-15 NOTE — Progress Notes (Signed)
Department of Radiation Oncology  Phone:  [phone] Fax:        [phone]  Weekly Treatment Note    Name: Jose [name] Date: 07/15/2011 MRN: [id number] DOB: 11-12-1927   Current dose: 40.3 gray  Current fraction: 19   MEDICATIONS: Current Outpatient Prescriptions  Medication Sig Dispense Refill  . amLODipine (NORVASC) 10 MG tablet Take 10 mg by mouth every morning.       . brimonidine (ALPHAGAN) 0.2 % ophthalmic solution Place 1 drop into both eyes 2 (two) times daily.  5 mL    . fentaNYL (DURAGESIC - DOSED MCG/HR) 12 MCG/HR Place 1 patch (12.5 mcg total) onto the skin every 3 (three) days.  10 patch  0  . gabapentin (NEURONTIN) 300 MG capsule Take 600 mg by mouth 2 (two) times daily before lunch and supper.       Marland Kitchen HYDROcodone-acetaminophen (LORTAB) 7.5-500 MG/15ML solution Take 15 mLs by mouth every 6 (six) hours as needed for pain.  480 mL  0  . LORazepam (ATIVAN) 0.5 MG tablet Take 0.5 mg by mouth 2 (two) times daily as needed. ANXIETY       . Multiple Vitamins-Minerals (MULTIVITAMIN WITH MINERALS) tablet Take 1 tablet by mouth daily.       . Nutritional Supplements (FEEDING SUPPLEMENT, OSMOLITE 1.5 CAL,) LIQD Increase Osmolite 1.5 via feeding tube to 1.5 cans QID with 60 cc water before and after each bolus TF.  Add 240 cc free water TID between feedings as tolerated.  1422 mL  0  . ondansetron (ZOFRAN) 8 MG tablet Take 1 tab two times a day starting the day after chemo for 3 days. Then take 1 tab two times a day as needed for nausea or vomiting.  30 tablet  1  . oxymetazoline (AFRIN) 0.05 % nasal spray Place 1 spray into the nose 2 (two) times daily as needed. ALLERGIES       . prochlorperazine (COMPAZINE) 10 MG tablet Take 1 tablet (10 mg total) by mouth every 6 (six) hours as needed (Nausea or vomiting).  30 tablet  1  . traMADol (ULTRAM) 50 MG tablet Take 50 mg by mouth daily as needed. Maximum dose= 8 tablets per day For pain      . Travoprost, BAK Free,  (TRAVATAMN) 0.004 % SOLN ophthalmic solution Place 1 drop into both eyes at bedtime.      . vitamin C (ASCORBIC ACID) 500 MG tablet Take 1,000 mg by mouth daily.       . vitamin E 400 UNIT capsule Take 400 Units by mouth daily.       Marland Kitchen DISCONTD: amLODipine (NORVASC) 10 MG tablet Take 1 tablet (10 mg total) by mouth daily.  30 tablet  0     ALLERGIES: Hydromorphone; Morphine and related; and Codeine   LABORATORY DATA:  Lab Results  Component Value Date   WBC 3.0* 07/12/2011   HGB 11.2* 07/12/2011   HCT 32.7* 07/12/2011   MCV 86.5 07/12/2011   PLT 168 07/12/2011   Lab Results  Component Value Date   NA 138 07/05/2011   K 4.7 07/05/2011   CL 105 07/05/2011   CO2 27 07/05/2011   Lab Results  Component Value Date   ALT 24 07/05/2011   AST 18 07/05/2011   ALKPHOS 75 07/05/2011   BILITOT 0.4 07/05/2011     NARRATIVE: Jose [name] was seen today for weekly treatment management. The chart was checked and the patient's films were reviewed. The  patient appears to be doing satisfactorily at this time. He has several issues which are being addressed. He is taking Zofran twice a day for some nausea and is applying skin cream to the neck area. He is on Duragesic patch as well as Lortab elixir as needed. He has been doing the Lortab at night and really has not been using it much during the day.  PHYSICAL EXAMINATION: weight is 172 lb 4.8 oz (78.155 kg).      the skin shows some radiation change with some dryness and diffuse erythema. No moist desquamation present. The oral cavity demonstrates some mucositis as well  ASSESSMENT: The patient is doing satisfactorily with treatment.  PLAN: We will continue with the patient's radiation treatment as planned. Jose [name] appears to be doing okay at this time. His weight is actually up over the last week and I have encouraged him to continue his efforts in this regard.

## 2011-07-15 NOTE — Progress Notes (Signed)
Pt reports increased fatigue, chemo held on Monday this week. Pt states only pain is w/swallowing. 6 cans Osmolite daily per peg tube; has increased water intake; pt tol well. Takes Zofran qam, qhs. Applying Biafine to neck area. Duragesic patch on, takes Lortab elixir nightly. Pt states he can't tell much relief from sore throat.  Advised he may need to take Lortab during day for better pain relief.

## 2011-07-16 ENCOUNTER — Other Ambulatory Visit: Payer: Self-pay | Admitting: Oncology

## 2011-07-16 ENCOUNTER — Ambulatory Visit
Admission: RE | Admit: 2011-07-16 | Discharge: 2011-07-16 | Disposition: A | Discharge status: Home / Self Care | Payer: Medicare Other | Source: Ambulatory Visit | Attending: Radiation Oncology | Admitting: Radiation Oncology

## 2011-07-19 ENCOUNTER — Other Ambulatory Visit: Payer: Medicare Other | Admitting: Lab

## 2011-07-19 ENCOUNTER — Ambulatory Visit: Payer: Medicare Other | Admitting: Nutrition

## 2011-07-19 ENCOUNTER — Ambulatory Visit: Payer: Self-pay | Admitting: Oncology

## 2011-07-19 ENCOUNTER — Ambulatory Visit
Admission: RE | Admit: 2011-07-19 | Discharge: 2011-07-19 | Disposition: A | Discharge status: Home / Self Care | Payer: Medicare Other | Source: Ambulatory Visit | Attending: Radiation Oncology | Admitting: Radiation Oncology

## 2011-07-19 ENCOUNTER — Ambulatory Visit: Payer: Medicare Other | Admitting: Oncology

## 2011-07-19 ENCOUNTER — Other Ambulatory Visit: Payer: Self-pay | Admitting: Lab

## 2011-07-19 ENCOUNTER — Ambulatory Visit (HOSPITAL_BASED_OUTPATIENT_CLINIC_OR_DEPARTMENT_OTHER): Payer: Medicare Other

## 2011-07-19 ENCOUNTER — Encounter: Payer: Self-pay | Admitting: Oncology

## 2011-07-19 VITALS — BP 123/60 | HR 70 | Temp 97.2°F | Ht 72.0 in | Wt 170.0 lb

## 2011-07-19 DIAGNOSIS — C01 Malignant neoplasm of base of tongue: Secondary | ICD-10-CM

## 2011-07-19 DIAGNOSIS — C801 Malignant (primary) neoplasm, unspecified: Secondary | ICD-10-CM

## 2011-07-19 DIAGNOSIS — Z5111 Encounter for antineoplastic chemotherapy: Secondary | ICD-10-CM

## 2011-07-19 DIAGNOSIS — C109 Malignant neoplasm of oropharynx, unspecified: Secondary | ICD-10-CM

## 2011-07-19 LAB — CBC WITH DIFFERENTIAL/PLATELET
BASO%: 2.2 % — ABNORMAL HIGH (ref 0.0–2.0)
EOS%: 0.9 % (ref 0.0–7.0)
HGB: 11.5 g/dL — ABNORMAL LOW (ref 13.0–17.1)
MCH: 29.5 pg (ref 27.2–33.4)
MCHC: 33.9 g/dL (ref 32.0–36.0)
MONO%: 17.1 % — ABNORMAL HIGH (ref 0.0–14.0)
RBC: 3.9 10*6/uL — ABNORMAL LOW (ref 4.20–5.82)
RDW: 14.2 % (ref 11.0–14.6)
lymph#: 0.2 10*3/uL — ABNORMAL LOW (ref 0.9–3.3)

## 2011-07-19 LAB — COMPREHENSIVE METABOLIC PANEL
ALT: 19 U/L (ref 0–53)
AST: 18 U/L (ref 0–37)
Albumin: 3.7 g/dL (ref 3.5–5.2)
BUN: 32 mg/dL — ABNORMAL HIGH (ref 6–23)
Calcium: 8.7 mg/dL (ref 8.4–10.5)
Chloride: 103 mEq/L (ref 96–112)
Potassium: 4.1 mEq/L (ref 3.5–5.3)
Sodium: 138 mEq/L (ref 135–145)
Total Protein: 6 g/dL (ref 6.0–8.3)

## 2011-07-19 MED ORDER — BIAFINE EX EMUL
CUTANEOUS | Status: DC | PRN
  Filled 2011-07-19: qty 45

## 2011-07-19 MED ORDER — PACLITAXEL CHEMO INJECTION 300 MG/50ML
33.7500 mg/m2 | Freq: Once | INTRAVENOUS | Status: AC
  Administered 2011-07-19: 66 mg via INTRAVENOUS
  Filled 2011-07-19: qty 11

## 2011-07-19 MED ORDER — SODIUM CHLORIDE 0.9 % IV SOLN
Freq: Once | INTRAVENOUS | Status: AC
  Administered 2011-07-19: 10:00:00 via INTRAVENOUS

## 2011-07-19 MED ORDER — ONDANSETRON 16 MG/50ML IVPB (CHCC)
16.0000 mg | Freq: Once | INTRAVENOUS | Status: AC
  Administered 2011-07-19: 16 mg via INTRAVENOUS

## 2011-07-19 MED ORDER — FAMOTIDINE IN NACL 20-0.9 MG/50ML-% IV SOLN
20.0000 mg | Freq: Once | INTRAVENOUS | Status: AC
  Administered 2011-07-19: 20 mg via INTRAVENOUS

## 2011-07-19 MED ORDER — DEXAMETHASONE SODIUM PHOSPHATE 4 MG/ML IJ SOLN
20.0000 mg | Freq: Once | INTRAMUSCULAR | Status: AC
  Administered 2011-07-19: 20 mg via INTRAVENOUS

## 2011-07-19 MED ORDER — DIPHENHYDRAMINE HCL 50 MG/ML IJ SOLN
50.0000 mg | Freq: Once | INTRAMUSCULAR | Status: AC
  Administered 2011-07-19: 50 mg via INTRAVENOUS

## 2011-07-19 MED ORDER — SODIUM CHLORIDE 0.9 % IV SOLN
103.9500 mg | Freq: Once | INTRAVENOUS | Status: AC
  Administered 2011-07-19: 100 mg via INTRAVENOUS
  Filled 2011-07-19: qty 10

## 2011-07-19 NOTE — Progress Notes (Signed)
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   CURRENT THERAPY: started definitive concurrent chemoradiation with weekly Carboplatin/Taxol on 06/14/2011; and daily XRT 06/21/11.   INTERVAL HISTORY: Jose [name] 78 y.o. male returns to clinic with his wife. Chemotherapy was held last week secondary mucositis. Patient reports that he had a better week last week. Still not able to eat by mouth due to difficulty swallowing.  He has pain in his throat. Fentanyl patch was started last week. Using Lortab once a day typically at bedtime. Continues to do swallowing exercises. Can swallow small amounts of liquid. Using Osmolite 4.5 cans/day through PEG. Receiving ~1 liter of fluid daily. His stamina is stable.  He has baseline fatigue even before starting chemoradiation; but his fatigue has not worsened.  He is able to walk around the house, but today he is in a wheelchair due to having to walk a long distance for his treatments. He is doing mouth rinses with salt/baking soda and diluted H2O2.  Has had a small amount of bleeding from his mouth. PEG is without purulent discharge or erythema.   Patient denies fever, headache, visual changes, confusion, drenching night sweats, palpable lymph node swelling, nausea, vomiting, jaundice, chest pain, palpitation, shortness of breath, dyspnea on exertion, productive cough, gum bleeding, epistaxis, hematemesis, hemoptysis, abdominal pain, abdominal swelling, early satiety, melena, hematochezia, hematuria, skin rash, spontaneous bleeding, joint swelling, joint pain, heat or cold intolerance, bowel bladder incontinence, back pain, focal motor weakness, paresthesia, depression, suicidal or homocidal ideation, feeling hopelessness.   Past Medical History  Diagnosis Date  .  Blood transfusion     2006 had 4 units  . Arthritis     knees,back,shoulders  . Anxiety   . Right ureteral stone S/P URETERAL STENT 01-24-11  AND ESWL  03-01-11  . History of acute renal failure 01-23-11    DUE TO HYDRONEPHROSIS AND RIGHT STONE  . Normal cardiac stress test 09-25-2007  . Glaucoma   . Cataract immature   . Diverticulosis of colon   . History of GI diverticular bleed   . Hyperlipemia   . Impaired hearing NOT WEARING HIS BILATERL AIDS  . Urgency of urination     DUE TO URETERAL STONE AND STENT  . Frequency of urination   . Insomnia   . Anemia   . Chronic kidney disease     kidney stones  . GERD (gastroesophageal reflux disease)   . Hernia     inguinal  . Headache     migraines  . Glaucoma   . Hearing impairment     bilateral  . Trigeminal neuralgia   . Hypertension   . Male breast cancer 1988    S/P RIGHT MASTECTOMY W/ NODE DISSECTION AND CHEMO  . 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 Skin cancer 2002    melanoma- back    Past Surgical History  Procedure Date  . Cystoscopy w/ ureteral stent placement 01/24/2011    Procedure: CYSTOSCOPY WITH RETROGRADE PYELOGRAM/URETERAL STENT PLACEMENT;  Surgeon: Antony Haste, MD;  Location: [facility];  Service: Urology;  Laterality: Right;  . Right leg surg for fx AGE 32    patient states left thigh  . Craniectomy suboccipital for exploration / decompression cranial nerves 2003    5TH  CRANIAL NERVE DECOMPRESSION  . Simple mastectomy 1988    MALE--- RIGHT BREAST W/ NODE DISSECTION  . Melanoma excision 10 YRS AGO    BACK AREA  . Extracorporeal shock wave lithotripsy 03-01-11    RIGHT  . Ureteroscopy 03/26/2011    Procedure: URETEROSCOPY;  Surgeon: Antony Haste, MD;  Location: [facility];  Service: Urology;  Laterality: Right;  RIGHT URETEROSCOPY, HOLMIUM LASER AND STENT C-ARM HOLMIUM LASER  . Examination under anesthesia 03/26/2011    Procedure: EXAM UNDER ANESTHESIA;   Surgeon: Antony Haste, MD;  Location: [facility];  Service: Urology;  Laterality: N/A;  . Lymph node biopsy 04/21/2011    Procedure: LYMPH NODE BIOPSY;  Surgeon: Susy Frizzle, MD;  Location: [facility];  Service: ENT;  Laterality: Right;    Current Outpatient Prescriptions  Medication Sig Dispense Refill  . amLODipine (NORVASC) 10 MG tablet Take 10 mg by mouth every morning.       . brimonidine (ALPHAGAN) 0.2 % ophthalmic solution Place 1 drop into both eyes 2 (two) times daily.  5 mL    . fentaNYL (DURAGESIC - DOSED MCG/HR) 12 MCG/HR Place 1 patch (12.5 mcg total) onto the skin every 3 (three) days.  10 patch  0  . gabapentin (NEURONTIN) 300 MG capsule Take 600 mg by mouth 2 (two) times daily before lunch and supper.       Marland Kitchen LORazepam (ATIVAN) 0.5 MG tablet Take 0.5 mg by mouth 2 (two) times daily as needed. ANXIETY       . Multiple Vitamins-Minerals (MULTIVITAMIN WITH MINERALS) tablet Take 1 tablet by mouth daily.       . Nutritional Supplements (FEEDING SUPPLEMENT, OSMOLITE 1.5 CAL,) LIQD Increase Osmolite 1.5 via feeding tube to 1.5 cans QID with 60 cc water before and after each bolus TF.  Add 240 cc free water TID between feedings as tolerated.  1422 mL  0  . ondansetron (ZOFRAN) 8 MG tablet Take 1 tab two times a day starting the day after chemo for 3 days. Then take 1 tab two times a day as needed for nausea or vomiting.  30 tablet  1  . oxymetazoline (AFRIN) 0.05 % nasal spray Place 1 spray into the nose 2 (two) times daily as needed. ALLERGIES       . prochlorperazine (COMPAZINE) 10 MG tablet Take 1 tablet (10 mg total) by mouth every 6 (six) hours as needed (Nausea or vomiting).  30 tablet  1  . traMADol (ULTRAM) 50 MG tablet Take 50 mg by mouth daily as needed. Maximum dose= 8 tablets per day For pain      . Travoprost, BAK Free, (TRAVATAMN) 0.004 % SOLN ophthalmic solution Place 1 drop into both eyes at bedtime.      . vitamin C (ASCORBIC ACID) 500 MG tablet  Take 1,000 mg by mouth daily.       . vitamin E 400 UNIT capsule Take 400 Units by mouth daily.       Marland Kitchen DISCONTD: amLODipine (NORVASC) 10 MG tablet Take 1 tablet (10 mg total) by mouth daily.  30 tablet  0    ALLERGIES:  is allergic to hydromorphone; morphine and related; and codeine.  REVIEW OF SYSTEMS:  The rest of the 14-point review of system was negative.   Filed Vitals:   07/19/11 0845  BP: 123/60  Pulse: 70  Temp: 97.2 F (36.2 C)   Wt Readings from Last 3 Encounters:  07/19/11 170 lb (  77.111 kg)  07/15/11 172 lb 4.8 oz (78.155 kg)  07/12/11 171 lb 11.2 oz (77.883 kg)   ECOG Performance status: 1-2  PHYSICAL EXAMINATION:   General: well-nourished in no acute distress. Eyes: no scleral icterus. ENT: Small amount of fresh blood in posterior pharynx. Neck was without thyromegaly. Lymphatics: No palpable cervical lymphadenopathy. There was no supraclavicular or axillary adenopathy. Respiratory: lungs were clear bilaterally without wheezing or crackles. Cardiovascular: Regular rate and rhythm, S1/S2, without murmur, rub or gallop. There was no pedal edema. GI: abdomen was soft, flat, nontender, nondistended, without organomegaly.  PEG tube was dry, clean, intact.  Muscoloskeletal: no spinal tenderness of palpation of vertebral spine. Skin exam was without echymosis, petichae. Neuro exam was nonfocal. Patient was able to get on and off exam table without assistance. Gait was normal. Patient was alerted and oriented. Attention was good. Language was appropriate. Mood was normal without depression. Speech was not pressured. Thought content was not tangential.    LABORATORY:   WBC 3.2, ANC 2.4, Hgb 11.5, Plt 165,000 Cr 1.33 on 07/05/11  ASSESSMENT AND PLAN:   1.  HLP:  He is on diet control. 2.  HTN:  He is on Amlodipine.  3.  Trigeminal neuralgia:  He is on neurontin and tramadol. 4.  History of smoking:  He quit in 1982.  5.  History of kidney stone in 2012 s/p stent. 6.   History of male breast cancer in 1989:   S/p mastectomy; s/p adjuvant chemoradiation; s/p 5 years of adjuvant Tamoxifen in distant past.  He is in remission from this cancer. 7.  Chronic Kidney disease:  Stable Cr.  8.  Right lung nodule uptake:  Low suspicion for met or lung cancer at this time.  He has no evidence of active pneumonia.  Will pay attention to this on follow up PET.  9.  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  He is s/p 4 weekly doses of Carboplatin/Taxol with grade 1 fatigue; grade 1 anorexia; and weight loss. He developed grade 2 mucositis last week and chemotherapy was held. Mucositis is better today. Will proceed with chemotherapy today. Dose will be reduced by 25% due to fatigue and mucositis. Encouraged him to continue XRT as scheduled.  10.  Mucositis prevention:  I advised him to continue with salt/baking soda mouth rinse alternating with diluted H2O2 (1:4 dilution) to thin out thick phlegm. 11.  Mild calorie-protein malnutrition:  Unable to swallow. He has PEG tube. I have reviewed Dietician recommendations with the patient and wife. Encouraged him to use Osmolite 1.5 cans QID. He was reminded to add 240 cc of free fluid TID between feedings. 12.  Nausea/vomiting prophy:  He has compazine/zofran/ativan prn. 13. Pain. Unable to swallow. Continue fentanyl patch and Lortab when necessary. 14.  Follow up:  Weekly lab/RV and chemo.    The patient was seen and examined by Dr Gaylyn Rong. >90% of plan of care developed by Dr Gaylyn Rong. The length of time of the face-to-face encounter was 20 minutes. More than 50% of time was spent counseling and coordination of care.

## 2011-07-19 NOTE — Patient Instructions (Signed)
Pt aware of XRT appt 4/30. Will call if any problems

## 2011-07-19 NOTE — Progress Notes (Signed)
Jose [name] has been unable to tolerate six cans of Osmolite 1.5 via PEG on a consistent basis.  He reports he gets in 4-5 cans daily.  He complains of bloating and abdominal distention.  He and his wife both confirm that he is tolerating free water flushes as previously educated.  Weight has continued to decline and was documented as 170 pounds today, which is down from 172.3 pounds on April 25 and 171.7 pounds on April 22.  NUTRITION DIAGNOSIS:  Inadequate oral intake continues, diagnosis of less than optimal enteral nutrition infusion continues.  INTERVENTION:  After discussing several strategies with the patient and wife the patient wishes to proceed with attempting 2 cans of Osmolite 1.5 t.i.d. throughout the day.  He is to continue to get 60 mL of free water before and after each bolus feeding.  In addition, he will add 240 mL of free water t.i.d. He will continue to work to try to sip on fluids as tolerated to get in another 120 mL of fluid which he will be missing due to changing his bolus feeding schedule from 4 times a day to 3 times a day.  If this is not tolerated then the patient is agreeable to trying a different formula next week.  MONITORING, EVALUATION, GOALS:  The patient is not tolerating tube feedings to meet 100% of his needs.  NEXT VISIT:  Monday, May 6, during chemotherapy.    ______________________________ Zenovia Jarred, RD, LDN Clinical Nutrition Specialist BN/MEDQ  D:  07/19/2011  T:  07/19/2011  Job:  [id number]

## 2011-07-20 ENCOUNTER — Ambulatory Visit
Admission: RE | Admit: 2011-07-20 | Discharge: 2011-07-20 | Disposition: A | Discharge status: Home / Self Care | Payer: Medicare Other | Source: Ambulatory Visit | Attending: Radiation Oncology | Admitting: Radiation Oncology

## 2011-07-21 ENCOUNTER — Ambulatory Visit
Admission: RE | Admit: 2011-07-21 | Discharge: 2011-07-21 | Disposition: A | Discharge status: Home / Self Care | Payer: Medicare Other | Source: Ambulatory Visit | Attending: Radiation Oncology | Admitting: Radiation Oncology

## 2011-07-22 ENCOUNTER — Ambulatory Visit
Admission: RE | Admit: 2011-07-22 | Discharge: 2011-07-22 | Disposition: A | Discharge status: Home / Self Care | Payer: Medicare Other | Source: Ambulatory Visit | Attending: Radiation Oncology | Admitting: Radiation Oncology

## 2011-07-22 ENCOUNTER — Encounter: Payer: Self-pay | Admitting: Radiation Oncology

## 2011-07-22 VITALS — BP 143/73 | HR 71 | Resp 18 | Wt 173.6 lb

## 2011-07-22 DIAGNOSIS — C01 Malignant neoplasm of base of tongue: Secondary | ICD-10-CM

## 2011-07-22 NOTE — Progress Notes (Signed)
Patient presents to the clinic today accompanied by his son for an under treat visit with Dr. Mitzi Hansen. Patient is alert and oriented to person, place, and time. No distress noted. Steady gait noted. Pleasant affect noted. Patient reports pain in his mouth and down his right arm that is undescribable. Patient has gained four pounds since 07/19/2011. Patient reports thick saliva despite using peroxide rinse. Patient reports dry mouth. Ulcerations noted along left cheek. Patient taking nothing by mouth. Patient reports instilling 2 cans of Oslite three times per day. Reported all findings to Dr. Mitzi Hansen.

## 2011-07-22 NOTE — Progress Notes (Signed)
Department of Radiation Oncology  Phone:  [phone] Fax:        [phone]  Weekly Treatment Note    Name: Jose [name] Date: 07/22/2011 MRN: [id number] DOB: 28-Jul-1927   Current dose: 50.9 gray  Current fraction: 24   MEDICATIONS: Current Outpatient Prescriptions  Medication Sig Dispense Refill  . amLODipine (NORVASC) 10 MG tablet Take 10 mg by mouth every morning.       . brimonidine (ALPHAGAN) 0.2 % ophthalmic solution Place 1 drop into both eyes 2 (two) times daily.  5 mL    . fentaNYL (DURAGESIC - DOSED MCG/HR) 12 MCG/HR Place 1 patch (12.5 mcg total) onto the skin every 3 (three) days.  10 patch  0  . gabapentin (NEURONTIN) 300 MG capsule Take 600 mg by mouth 2 (two) times daily before lunch and supper.       Marland Kitchen LORazepam (ATIVAN) 0.5 MG tablet Take 0.5 mg by mouth 2 (two) times daily as needed. ANXIETY       . Multiple Vitamins-Minerals (MULTIVITAMIN WITH MINERALS) tablet Take 1 tablet by mouth daily.       . Nutritional Supplements (FEEDING SUPPLEMENT, OSMOLITE 1.5 CAL,) LIQD Increase Osmolite 1.5 via feeding tube to 1.5 cans QID with 60 cc water before and after each bolus TF.  Add 240 cc free water TID between feedings as tolerated.  1422 mL  0  . ondansetron (ZOFRAN) 8 MG tablet Take 1 tab two times a day starting the day after chemo for 3 days. Then take 1 tab two times a day as needed for nausea or vomiting.  30 tablet  1  . oxymetazoline (AFRIN) 0.05 % nasal spray Place 1 spray into the nose 2 (two) times daily as needed. ALLERGIES       . prochlorperazine (COMPAZINE) 10 MG tablet Take 1 tablet (10 mg total) by mouth every 6 (six) hours as needed (Nausea or vomiting).  30 tablet  1  . traMADol (ULTRAM) 50 MG tablet Take 50 mg by mouth daily as needed. Maximum dose= 8 tablets per day For pain      . Travoprost, BAK Free, (TRAVATAMN) 0.004 % SOLN ophthalmic solution Place 1 drop into both eyes at bedtime.      . vitamin C (ASCORBIC ACID) 500 MG tablet Take 1,000  mg by mouth daily.       . vitamin E 400 UNIT capsule Take 400 Units by mouth daily.       Marland Kitchen DISCONTD: amLODipine (NORVASC) 10 MG tablet Take 1 tablet (10 mg total) by mouth daily.  30 tablet  0   No current facility-administered medications for this encounter.   Facility-Administered Medications Ordered in Other Encounters  Medication Dose Route Frequency Provider Last Rate Last Dose  . topical emolient (BIAFINE) emulsion   Topical PRN Jonna Coup, MD         ALLERGIES: Hydromorphone; Morphine and related; and Codeine   LABORATORY DATA:  Lab Results  Component Value Date   WBC 3.2* 07/19/2011   HGB 11.5* 07/19/2011   HCT 33.9* 07/19/2011   MCV 86.9 07/19/2011   PLT 165 07/19/2011   Lab Results  Component Value Date   NA 138 07/19/2011   K 4.1 07/19/2011   CL 103 07/19/2011   CO2 29 07/19/2011   Lab Results  Component Value Date   ALT 19 07/19/2011   AST 18 07/19/2011   ALKPHOS 69 07/19/2011   BILITOT 0.4 07/19/2011     NARRATIVE: Jose Shark  L [name] was seen today for weekly treatment management. The chart was checked and the patient's films were reviewed. The patient appears to be doing satisfactorily. He is having some significant acute toxicity, however which is to be expected at this point. The patient was somewhat emotional about everything today. He has actually gained some weight which am pleased with and I discussed this with him. He is complaining of thick saliva as well as dry mouth, and also a sore throat. He is using his feeding tube essentially exclusively at this point..  PHYSICAL EXAMINATION: weight is 173 lb 9.6 oz (78.744 kg). His blood pressure is 143/73 and his pulse is 71. His respiration is 18.      significant desquamation is emerging with some significant dry desquamation. No moist desquamation at this point. I am hopeful that this will hold off another couple of weeks although it may lead to some moist desquamation at the end of treatment. Significant mucositis  present especially in the posterior oral cavity.  ASSESSMENT: The patient is doing satisfactorily with treatment.  PLAN: We will continue with the patient's radiation treatment as planned. Patient states that his pain is under good control at this point. This is one of the main focus points I believe for the remainder.

## 2011-07-23 ENCOUNTER — Ambulatory Visit
Admission: RE | Admit: 2011-07-23 | Discharge: 2011-07-23 | Disposition: A | Discharge status: Home / Self Care | Payer: Medicare Other | Source: Ambulatory Visit | Attending: Radiation Oncology | Admitting: Radiation Oncology

## 2011-07-26 ENCOUNTER — Other Ambulatory Visit: Payer: Self-pay | Admitting: Oncology

## 2011-07-26 ENCOUNTER — Ambulatory Visit: Payer: Medicare Other | Admitting: Nutrition

## 2011-07-26 ENCOUNTER — Ambulatory Visit
Admission: RE | Admit: 2011-07-26 | Discharge: 2011-07-26 | Disposition: A | Discharge status: Home / Self Care | Payer: Medicare Other | Source: Ambulatory Visit | Attending: Radiation Oncology | Admitting: Radiation Oncology

## 2011-07-26 ENCOUNTER — Ambulatory Visit (HOSPITAL_BASED_OUTPATIENT_CLINIC_OR_DEPARTMENT_OTHER): Payer: Medicare Other

## 2011-07-26 ENCOUNTER — Ambulatory Visit (HOSPITAL_BASED_OUTPATIENT_CLINIC_OR_DEPARTMENT_OTHER): Payer: Medicare Other | Admitting: Oncology

## 2011-07-26 ENCOUNTER — Other Ambulatory Visit (HOSPITAL_BASED_OUTPATIENT_CLINIC_OR_DEPARTMENT_OTHER): Payer: Medicare Other | Admitting: Lab

## 2011-07-26 VITALS — BP 111/56 | HR 67 | Temp 97.6°F | Ht 72.0 in | Wt 175.5 lb

## 2011-07-26 DIAGNOSIS — Z5111 Encounter for antineoplastic chemotherapy: Secondary | ICD-10-CM

## 2011-07-26 DIAGNOSIS — N189 Chronic kidney disease, unspecified: Secondary | ICD-10-CM

## 2011-07-26 DIAGNOSIS — C109 Malignant neoplasm of oropharynx, unspecified: Secondary | ICD-10-CM

## 2011-07-26 DIAGNOSIS — C029 Malignant neoplasm of tongue, unspecified: Secondary | ICD-10-CM

## 2011-07-26 DIAGNOSIS — Z9221 Personal history of antineoplastic chemotherapy: Secondary | ICD-10-CM

## 2011-07-26 DIAGNOSIS — C01 Malignant neoplasm of base of tongue: Secondary | ICD-10-CM

## 2011-07-26 DIAGNOSIS — G500 Trigeminal neuralgia: Secondary | ICD-10-CM

## 2011-07-26 DIAGNOSIS — C801 Malignant (primary) neoplasm, unspecified: Secondary | ICD-10-CM

## 2011-07-26 HISTORY — DX: Personal history of antineoplastic chemotherapy: Z92.21

## 2011-07-26 LAB — CBC WITH DIFFERENTIAL/PLATELET
Basophils Absolute: 0 10*3/uL (ref 0.0–0.1)
EOS%: 1.2 % (ref 0.0–7.0)
HCT: 30.5 % — ABNORMAL LOW (ref 38.4–49.9)
LYMPH%: 5.4 % — ABNORMAL LOW (ref 14.0–49.0)
MCH: 29.7 pg (ref 27.2–33.4)
MCV: 87.9 fL (ref 79.3–98.0)
NEUT#: 3.2 10*3/uL (ref 1.5–6.5)
NEUT%: 78.1 % — ABNORMAL HIGH (ref 39.0–75.0)
Platelets: 203 10*3/uL (ref 140–400)
WBC: 4.1 10*3/uL (ref 4.0–10.3)
lymph#: 0.2 10*3/uL — ABNORMAL LOW (ref 0.9–3.3)

## 2011-07-26 LAB — BASIC METABOLIC PANEL
BUN: 34 mg/dL — ABNORMAL HIGH (ref 6–23)
Creatinine, Ser: 1.37 mg/dL — ABNORMAL HIGH (ref 0.50–1.35)
Glucose, Bld: 169 mg/dL — ABNORMAL HIGH (ref 70–99)
Potassium: 4.6 mEq/L (ref 3.5–5.3)

## 2011-07-26 MED ORDER — SODIUM CHLORIDE 0.9 % IV SOLN
Freq: Once | INTRAVENOUS | Status: AC
  Administered 2011-07-26: 10:00:00 via INTRAVENOUS

## 2011-07-26 MED ORDER — FAMOTIDINE IN NACL 20-0.9 MG/50ML-% IV SOLN
20.0000 mg | Freq: Once | INTRAVENOUS | Status: AC
  Administered 2011-07-26: 20 mg via INTRAVENOUS

## 2011-07-26 MED ORDER — ONDANSETRON 16 MG/50ML IVPB (CHCC)
16.0000 mg | Freq: Once | INTRAVENOUS | Status: AC
  Administered 2011-07-26: 16 mg via INTRAVENOUS

## 2011-07-26 MED ORDER — PACLITAXEL CHEMO INJECTION 300 MG/50ML
33.7500 mg/m2 | Freq: Once | INTRAVENOUS | Status: AC
  Administered 2011-07-26: 66 mg via INTRAVENOUS
  Filled 2011-07-26: qty 11

## 2011-07-26 MED ORDER — DIPHENHYDRAMINE HCL 50 MG/ML IJ SOLN: 50.0000 mg | Freq: Once | INTRAMUSCULAR | Status: DC

## 2011-07-26 MED ORDER — DEXAMETHASONE SODIUM PHOSPHATE 4 MG/ML IJ SOLN
20.0000 mg | Freq: Once | INTRAMUSCULAR | Status: AC
  Administered 2011-07-26: 20 mg via INTRAVENOUS

## 2011-07-26 MED ORDER — SODIUM CHLORIDE 0.9 % IV SOLN
103.9500 mg | Freq: Once | INTRAVENOUS | Status: AC
  Administered 2011-07-26: 100 mg via INTRAVENOUS
  Filled 2011-07-26: qty 10

## 2011-07-26 NOTE — Progress Notes (Signed)
[name] reports tolerating 2 cans of Osmolite 1.5 t.i.d. without any difficulty.  He continues 60 mL of free water before and after each bolus feeding.  In addition, he knows he is supposed to be putting 240 mL of water through his tube 3 times a day as well as some water by mouth; however, he is not able to tell me whether or not he has been consistent with that.  He denies problems or issues with his feeding. His weight has increased to 175.5 pounds today, which is up from 173.6 pounds on 05/02 and up from 170 pounds 1 week ago on 04/29.  NUTRITION DIAGNOSIS:  Inadequate oral intake continues.  Diagnosis of less than optimal enteral nutrition infusion has resolved.  INTERVENTION:  I have encouraged [name] to continue 2 cans of Osmolite 1.5 t.i.d. with 60 mL of free water before and after.  He is to be sure to flush his tube with 240 mL of free water t.i.d.  He is to continue to try to consume water by mouth as well as continuing swallowing exercises.  MONITORING, EVALUATION, GOALS:  The patient has tolerated his tube feeding to meet 100% of his estimated needs with reported weight gain.   NEXT VISIT:  Monday, May 13 during chemotherapy.    ______________________________ Zenovia Jarred, RD, LDN Clinical Nutrition Specialist BN/MEDQ  D:  07/26/2011  T:  07/26/2011  Job:  [id number]

## 2011-07-26 NOTE — Progress Notes (Signed)
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   CURRENT THERAPY: started definitive concurrent chemoradiation with weekly Carboplatin/Taxol on 06/14/2011; and daily XRT 06/21/11.   INTERVAL HISTORY: Jose [name] 78 y.o. male returns for regular follow up.  He is here with her husband.  He has been having more fatigue; and has been less mobile this weekend.  He is still independent of activities of daily living.  His mucositis this week is not as severe as last week.  He still has Fentanyl patch 59mcg/hr and takes Lortab elixir prn breakthrough pain.  He is now 100% dependent of his PEG tube; he is not very adherent to his swallowing exercises; however, he is still able to drink some water.  His cervical adenopathy has significantly improved.  He has developed skin rash in the neck from treatment; he is using Biafine cream daily.  He is adherent to mouth rinses with salt/baking soda alternating with diluted H2O2.  He denies problem with his PEG tube.   Patient denies headache, visual changes, confusion, drenching night sweats, palpable lymph node swelling, nausea vomiting, jaundice, chest pain, palpitation, shortness of breath, dyspnea on exertion, productive cough, gum bleeding, epistaxis, hematemesis, hemoptysis, abdominal pain, abdominal swelling, early satiety, melena, hematochezia, hematuria, skin rash, spontaneous bleeding, joint swelling, joint pain, heat or cold intolerance, bowel bladder incontinence, back pain, focal motor weakness, paresthesia, depression, suicidal or homocidal ideation, feeling hopelessness.   Past Medical History  Diagnosis Date  . Blood transfusion     2006 had 4 units  . Arthritis     knees,back,shoulders  .  Anxiety   . Right ureteral stone S/P URETERAL STENT 01-24-11  AND ESWL  03-01-11  . History of acute renal failure 01-23-11    DUE TO HYDRONEPHROSIS AND RIGHT STONE  . Normal cardiac stress test 09-25-2007  . Glaucoma   . Cataract immature   . Diverticulosis of colon   . History of GI diverticular bleed   . Hyperlipemia   . Impaired hearing NOT WEARING HIS BILATERL AIDS  . Urgency of urination     DUE TO URETERAL STONE AND STENT  . Frequency of urination   . Insomnia   . Anemia   . Chronic kidney disease     kidney stones  . GERD (gastroesophageal reflux disease)   . Hernia     inguinal  . Headache     migraines  . Glaucoma   . Hearing impairment     bilateral  . Trigeminal neuralgia   . Hypertension   . Male breast cancer 1988    S/P RIGHT MASTECTOMY W/ NODE DISSECTION AND CHEMO  . 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 Skin cancer 2002    melanoma- back    Past Surgical History  Procedure Date  . Cystoscopy w/ ureteral stent placement 01/24/2011    Procedure: CYSTOSCOPY WITH RETROGRADE PYELOGRAM/URETERAL STENT PLACEMENT;  Surgeon: Antony Haste, MD;  Location: [facility];  Service: Urology;  Laterality: Right;  . Right leg surg for fx AGE 73    patient states left thigh  . Craniectomy suboccipital for exploration / decompression cranial nerves 2003    5TH CRANIAL NERVE DECOMPRESSION  . Simple mastectomy 1988  MALE--- RIGHT BREAST W/ NODE DISSECTION  . Melanoma excision 10 YRS AGO    BACK AREA  . Extracorporeal shock wave lithotripsy 03-01-11    RIGHT  . Ureteroscopy 03/26/2011    Procedure: URETEROSCOPY;  Surgeon: Antony Haste, MD;  Location: [facility];  Service: Urology;  Laterality: Right;  RIGHT URETEROSCOPY, HOLMIUM LASER AND STENT C-ARM HOLMIUM LASER  . Examination under anesthesia 03/26/2011    Procedure: EXAM UNDER ANESTHESIA;  Surgeon: Antony Haste, MD;  Location: [facility];   Service: Urology;  Laterality: N/A;  . Lymph node biopsy 04/21/2011    Procedure: LYMPH NODE BIOPSY;  Surgeon: Susy Frizzle, MD;  Location: [facility];  Service: ENT;  Laterality: Right;    Current Outpatient Prescriptions  Medication Sig Dispense Refill  . amLODipine (NORVASC) 10 MG tablet Take 10 mg by mouth every morning.       . brimonidine (ALPHAGAN) 0.2 % ophthalmic solution Place 1 drop into both eyes 2 (two) times daily.  5 mL    . fentaNYL (DURAGESIC - DOSED MCG/HR) 12 MCG/HR Place 1 patch (12.5 mcg total) onto the skin every 3 (three) days.  10 patch  0  . gabapentin (NEURONTIN) 300 MG capsule Take 600 mg by mouth 2 (two) times daily before lunch and supper.       Marland Kitchen HYDROcodone-acetaminophen (LORTAB) 7.5-500 MG/15ML solution Take 15 mLs by mouth every 6 (six) hours as needed.      Marland Kitchen LORazepam (ATIVAN) 0.5 MG tablet Take 0.5 mg by mouth 2 (two) times daily as needed. ANXIETY       . Multiple Vitamins-Minerals (MULTIVITAMIN WITH MINERALS) tablet Take 1 tablet by mouth daily.       . Nutritional Supplements (FEEDING SUPPLEMENT, OSMOLITE 1.5 CAL,) LIQD Increase Osmolite 1.5 via feeding tube to 1.5 cans QID with 60 cc water before and after each bolus TF.  Add 240 cc free water TID between feedings as tolerated.  1422 mL  0  . ondansetron (ZOFRAN) 8 MG tablet Take 1 tab two times a day starting the day after chemo for 3 days. Then take 1 tab two times a day as needed for nausea or vomiting.  30 tablet  1  . oxymetazoline (AFRIN) 0.05 % nasal spray Place 1 spray into the nose 2 (two) times daily as needed. ALLERGIES       . prochlorperazine (COMPAZINE) 10 MG tablet Take 1 tablet (10 mg total) by mouth every 6 (six) hours as needed (Nausea or vomiting).  30 tablet  1  . Travoprost, BAK Free, (TRAVATAMN) 0.004 % SOLN ophthalmic solution Place 1 drop into both eyes at bedtime.      . vitamin C (ASCORBIC ACID) 500 MG tablet Take 1,000 mg by mouth daily.       . vitamin E 400 UNIT capsule Take 400  Units by mouth daily.       . traMADol (ULTRAM) 50 MG tablet Take 50 mg by mouth daily as needed. Maximum dose= 8 tablets per day For pain      . DISCONTD: amLODipine (NORVASC) 10 MG tablet Take 1 tablet (10 mg total) by mouth daily.  30 tablet  0   No current facility-administered medications for this visit.   Facility-Administered Medications Ordered in Other Visits  Medication Dose Route Frequency Provider Last Rate Last Dose  . topical emolient (BIAFINE) emulsion   Topical PRN Jonna Coup, MD        ALLERGIES:  is allergic to hydromorphone; morphine and related; and codeine.  REVIEW OF SYSTEMS:  The rest of the 14-point review of system was negative.   Filed Vitals:   07/26/11 0845  BP: 111/56  Pulse: 67  Temp: 97.6 F (36.4 C)   Wt Readings from Last 3 Encounters:  07/26/11 175 lb 8 oz (79.606 kg)  07/22/11 173 lb 9.6 oz (78.744 kg)  07/19/11 170 lb (77.111 kg)   ECOG Performance status: 1-2  PHYSICAL EXAMINATION:   General:  Thin, tired appearing man, in no acute distress.  Eyes:  no scleral icterus.  ENT:  There was soft palate erythema from treatment without thrush.  Neck was without thyromegaly.  Lymphatics:  Negative cervical, supraclavicular or axillary adenopathy.  Skin of his neck was red and flaky but no open/weeping wound.  Respiratory: lungs were clear bilaterally without wheezing or crackles.  Cardiovascular:  Regular rate and rhythm, S1/S2, without murmur, rub or gallop.  There was no pedal edema.  GI:  abdomen was soft, flat, nontender, nondistended, without organomegaly.  PEG tube dry/clean/intact.  Muscoloskeletal:  no spinal tenderness of palpation of vertebral spine.  Skin exam was without echymosis, petichae.  Neuro exam was nonfocal.  Patient was able to get on and off exam table without assistance.  Gait was normal.  Patient was alerted and oriented.  Attention was good.   Language was appropriate.  Mood was normal without depression.  Speech was not  pressured.  Thought content was not tangential.     LABORATORY/RADIOLOGY DATA:  Lab Results  Component Value Date   WBC 4.1 07/26/2011   HGB 10.3* 07/26/2011   HCT 30.5* 07/26/2011   PLT 203 07/26/2011   GLUCOSE 167* 07/19/2011   CHOL  Value: 217        ATP III CLASSIFICATION:  <200     mg/dL   Desirable  161-096  mg/dL   Borderline High  >=045    mg/dL   High* 06/28/8117   TRIG 150* 09/16/2007   HDL 23* 09/16/2007   LDLCALC  Value: 164        Total Cholesterol/HDL:CHD Risk Coronary Heart Disease Risk Table                     Men   Women  1/2 Average Risk   3.4   3.3* 09/16/2007   ALKPHOS 69 07/19/2011   ALT 19 07/19/2011   AST 18 07/19/2011   NA 138 07/19/2011   K 4.1 07/19/2011   CL 103 07/19/2011   CREATININE 1.24 07/19/2011   BUN 32* 07/19/2011   CO2 29 07/19/2011   INR 0.97 05/17/2011   HGBA1C 6.1* 01/24/2011     ASSESSMENT AND PLAN:    1. HLP: He is on diet control.  2. HTN: He is on Amlodipine.  3. Trigeminal neuralgia: He is on neurontin and tramadol.  4. History of smoking: He quit in 1982.  5. History of kidney stone in 2012 s/p stent.  6. History of male breast cancer in 1989: S/p mastectomy; s/p adjuvant chemoradiation; s/p 5 years of adjuvant Tamoxifen in distant past. He is in remission from this cancer.  7. Chronic Kidney disease: Stable Cr.  8. Right lung nodule uptake: Low suspicion for met or lung cancer at this time. He has no evidence of active pneumonia. Will pay attention to this on follow up PET.   9. cT4 N2c Mx right base of the tongue squamous cell carcinoma with extension to bilateral  epiglottis and bilateral cervical neck node. He is s/p 5 weekly doses of Carboplatin/Taxol with grade 1-2 fatigue, grade 1 anorexia/weight loss, grade 1 anemia, grade 2 mucositis.  With dose reduction of chemo last week, his mucositis has stabilized.  I recommended him to proceed with the penultimate dose of chemo today (same dose as last cycle without further dose reduction).    10.  Mucositis prevention: I advised him again to continue with salt/baking soda mouth rinse alternating with diluted H2O2 (1:4 dilution) to thin out thick phlegm.   11. Mild calorie-protein malnutrition: He is now PEG tube dependent.  His weight is relatively stable.  I advised him to be more adherent to swallowing exercises to decrease the risk of esophageal stricture.   12. Nausea/vomiting prophy: He has compazine/zofran/ativan prn.   13. Pain. Unable to swallow. Continue fentanyl patch and Lortab when necessary.   This regimen is working well for him.   14. Follow up: in one week.         The length of time of the face-to-face encounter was 15 minutes. More than 50% of time was spent counseling and coordination of care.

## 2011-07-26 NOTE — Patient Instructions (Signed)
[facility] Discharge Instructions for Patients Receiving Chemotherapy  Today you received the following chemotherapy agents  Taxotere/ carboplatin  To help prevent nausea and vomiting after your treatment, we encourage you to take your nausea medication and take it as often as prescribed    If you develop nausea and vomiting that is not controlled by your nausea medication, call the clinic. If it is after clinic hours your family physician or the after hours number for the clinic or go to the Emergency Department.   BELOW ARE SYMPTOMS THAT SHOULD BE REPORTED IMMEDIATELY:  *FEVER GREATER THAN 100.5 F  *CHILLS WITH OR WITHOUT FEVER  NAUSEA AND VOMITING THAT IS NOT CONTROLLED WITH YOUR NAUSEA MEDICATION  *UNUSUAL SHORTNESS OF BREATH  *UNUSUAL BRUISING OR BLEEDING  TENDERNESS IN MOUTH AND THROAT WITH OR WITHOUT PRESENCE OF ULCERS  *URINARY PROBLEMS  *BOWEL PROBLEMS  UNUSUAL RASH Items with * indicate a potential emergency and should be followed up as soon as possible.  One of the nurses will contact you 24 hours after your treatment. Please let the nurse know about any problems that you may have experienced. Feel free to call the clinic you have any questions or concerns. The clinic phone number is [phone].   I have been informed and understand all the instructions given to me. I know to contact the clinic, my physician, or go to the Emergency Department if any problems should occur. I do not have any questions at this time, but understand that I may call the clinic during office hours   should I have any questions or need assistance in obtaining follow up care.    __________________________________________  _____________  __________ Signature of Patient or Authorized Representative            Date                   Time    __________________________________________ Nurse's Signature

## 2011-07-27 ENCOUNTER — Ambulatory Visit
Admission: RE | Admit: 2011-07-27 | Discharge: 2011-07-27 | Disposition: A | Discharge status: Home / Self Care | Payer: Medicare Other | Source: Ambulatory Visit | Attending: Radiation Oncology | Admitting: Radiation Oncology

## 2011-07-28 ENCOUNTER — Ambulatory Visit
Admission: RE | Admit: 2011-07-28 | Discharge: 2011-07-28 | Disposition: A | Discharge status: Home / Self Care | Payer: Medicare Other | Source: Ambulatory Visit | Attending: Radiation Oncology | Admitting: Radiation Oncology

## 2011-07-29 ENCOUNTER — Ambulatory Visit
Admission: RE | Admit: 2011-07-29 | Discharge: 2011-07-29 | Disposition: A | Discharge status: Home / Self Care | Payer: Medicare Other | Source: Ambulatory Visit | Attending: Radiation Oncology | Admitting: Radiation Oncology

## 2011-07-29 ENCOUNTER — Encounter: Payer: Self-pay | Admitting: Radiation Oncology

## 2011-07-29 ENCOUNTER — Encounter: Payer: Self-pay | Admitting: *Deleted

## 2011-07-29 VITALS — BP 141/69 | HR 71 | Temp 99.2°F | Resp 20 | Wt 174.1 lb

## 2011-07-29 DIAGNOSIS — C01 Malignant neoplasm of base of tongue: Secondary | ICD-10-CM

## 2011-07-29 MED ORDER — BIAFINE EX EMUL
CUTANEOUS | Status: AC | PRN
  Administered 2011-07-29: 12:00:00 via TOPICAL

## 2011-07-29 NOTE — Progress Notes (Signed)
Encounter addended by: Lowella Petties, RN on: 07/29/2011  1:08 PM<BR>     Documentation filed: Orders

## 2011-07-29 NOTE — Progress Notes (Signed)
Patient alert,oriented x3 completed 29/33 rad txs, using biafine cream, gave another tube,patient almost out, dry desquamation on neck and ears, c/o sore throat, has sore on right side tongue, very weak ,fatigued, had chemotherapay on 07/26/11,  Via peg osmolite 1.5  1 can tid with 60ml fre water before and after , using mouth rinces, salt/baking soda alternates with hydrogen peroxide, has fentanyl patch and lortab for pain 11:40 AM

## 2011-07-29 NOTE — Progress Notes (Signed)
Department of Radiation Oncology  Phone:  [phone] Fax:        [phone]  Weekly Treatment Note    Name: Jose [name] Date: 07/29/2011 MRN: [id number] DOB: [birth year]   Current dose: 61.5 gray  Current fraction: 29   MEDICATIONS: Current Outpatient Prescriptions  Medication Sig Dispense Refill  . amLODipine (NORVASC) 10 MG tablet Take 10 mg by mouth every morning.       . brimonidine (ALPHAGAN) 0.2 % ophthalmic solution Place 1 drop into both eyes 2 (two) times daily.  5 mL    . fentaNYL (DURAGESIC - DOSED MCG/HR) 12 MCG/HR Place 1 patch (12.5 mcg total) onto the skin every 3 (three) days.  10 patch  0  . gabapentin (NEURONTIN) 300 MG capsule Take 600 mg by mouth 2 (two) times daily before lunch and supper.       Marland Kitchen HYDROcodone-acetaminophen (LORTAB) 7.5-500 MG/15ML solution Take 15 mLs by mouth every 6 (six) hours as needed.      Marland Kitchen LORazepam (ATIVAN) 0.5 MG tablet Take 0.5 mg by mouth 2 (two) times daily as needed. ANXIETY       . Multiple Vitamins-Minerals (MULTIVITAMIN WITH MINERALS) tablet Take 1 tablet by mouth daily.       . Nutritional Supplements (FEEDING SUPPLEMENT, OSMOLITE 1.5 CAL,) LIQD Increase Osmolite 1.5 via feeding tube to 1.5 cans QID with 60 cc water before and after each bolus TF.  Add 240 cc free water TID between feedings as tolerated.  1422 mL  0  . ondansetron (ZOFRAN) 8 MG tablet Take 1 tab two times a day starting the day after chemo for 3 days. Then take 1 tab two times a day as needed for nausea or vomiting.  30 tablet  1  . oxymetazoline (AFRIN) 0.05 % nasal spray Place 1 spray into the nose 2 (two) times daily as needed. ALLERGIES       . prochlorperazine (COMPAZINE) 10 MG tablet Take 1 tablet (10 mg total) by mouth every 6 (six) hours as needed (Nausea or vomiting).  30 tablet  1  . traMADol (ULTRAM) 50 MG tablet Take 50 mg by mouth daily as needed. Maximum dose= 8 tablets per day For pain      . Travoprost, BAK Free, (TRAVATAMN) 0.004 %  SOLN ophthalmic solution Place 1 drop into both eyes at bedtime.      . vitamin C (ASCORBIC ACID) 500 MG tablet Take 1,000 mg by mouth daily.       . vitamin E 400 UNIT capsule Take 400 Units by mouth daily.       Marland Kitchen DISCONTD: amLODipine (NORVASC) 10 MG tablet Take 1 tablet (10 mg total) by mouth daily.  30 tablet  0   No current facility-administered medications for this encounter.   Facility-Administered Medications Ordered in Other Encounters  Medication Dose Route Frequency Provider Last Rate Last Dose  . topical emolient (BIAFINE) emulsion   Topical PRN Jonna Coup, MD         ALLERGIES: Hydromorphone; Morphine and related; and Codeine   LABORATORY DATA:  Lab Results  Component Value Date   WBC 4.1 07/26/2011   HGB 10.3* 07/26/2011   HCT 30.5* 07/26/2011   MCV 87.9 07/26/2011   PLT 203 07/26/2011   Lab Results  Component Value Date   NA 138 07/26/2011   K 4.6 07/26/2011   CL 102 07/26/2011   CO2 31 07/26/2011   Lab Results  Component Value Date   ALT 19  07/19/2011   AST 18 07/19/2011   ALKPHOS 69 07/19/2011   BILITOT 0.4 07/19/2011     NARRATIVE: Jose [name] was seen today for weekly treatment management. The chart was checked and the patient's films were reviewed. The patient is doing reasonably well this week. He does appear quite fatigued. No major changes and no new complaints. The patient is using Biafine cream each day and his tube feeds appear to be going reasonably well. The patient's weight is stable and his blood pressure is okay today  PHYSICAL EXAMINATION: weight is 174 lb 1.6 oz (78.971 kg). His oral temperature is 99.2 F (37.3 C). His blood pressure is 141/69 and his pulse is 71. His respiration is 20.      the patient exhibits some ongoing irritation of the scan within the treatment area with some desquamation/residual buildup of skin cream. Stable mucositis  ASSESSMENT: The patient is doing satisfactorily with treatment.  PLAN: We will continue with the  patient's radiation treatment as planned. Patient will finish next week. I'm pleased with how he is done and how he has hung in there. I will see him next Wednesday.

## 2011-07-30 ENCOUNTER — Encounter: Payer: Self-pay | Admitting: Radiation Oncology

## 2011-07-30 ENCOUNTER — Ambulatory Visit
Admission: RE | Admit: 2011-07-30 | Discharge: 2011-07-30 | Disposition: A | Discharge status: Home / Self Care | Payer: Medicare Other | Source: Ambulatory Visit | Attending: Radiation Oncology | Admitting: Radiation Oncology

## 2011-07-30 VITALS — BP 135/70 | HR 74 | Temp 98.5°F | Resp 18 | Wt 174.1 lb

## 2011-07-30 DIAGNOSIS — C01 Malignant neoplasm of base of tongue: Secondary | ICD-10-CM

## 2011-07-30 NOTE — Progress Notes (Signed)
I was asked to see the patient today. I was told that the patient requested this. When I discussed this with the patient he indicated that he did not wish to be seen today and he did want to go home. He was unsure of why I was seeing him.  The patient again is quite fatigued. His status has been declining some with treatment although he is nearing completion of his radiation. Hopefully we will be able to finish his course next week.  Orthostatics were fine which was my main concern with him today. He was seen yesterday.

## 2011-07-30 NOTE — Progress Notes (Signed)
Patient driven to appointment today by a family friend who wished to wait in the lobby. Received patient in the clinic today for an unscheduled PUT with Dr. Mitzi Hansen because of weakness and disorientation. Patient is alert and oriented to person, place, and time. No distress noted. Flat affect noted. Patient reports constant burning mouth and throat pain 9 on a scale of 0-10 despite using fentanyl and lortab for pain. Patient reports having a blood nose this morning. Patient reports vomiting twice this morin. Patient reports that he tries to instill at least six cans of osmolite per day via PEG tube. Patient reports he has instilled two can thus far today. Patient reports that he is very weak. Dry mouth and thick saliva noted and reported. Patient reports using Biafine cream to anterior neck as directed. Dry desquamation on anterior neck noted. Patient reports he is very weak. Orthostatics obtained. Reported all findings to Dr. Mitzi Hansen    Sitting 74, 135/70 Standing 80, 123/61

## 2011-08-02 ENCOUNTER — Encounter: Payer: Self-pay | Admitting: Nutrition

## 2011-08-02 ENCOUNTER — Ambulatory Visit
Admission: RE | Admit: 2011-08-02 | Discharge: 2011-08-02 | Disposition: A | Discharge status: Home / Self Care | Payer: Medicare Other | Source: Ambulatory Visit | Attending: Radiation Oncology | Admitting: Radiation Oncology

## 2011-08-02 ENCOUNTER — Telehealth: Payer: Self-pay | Admitting: Oncology

## 2011-08-02 ENCOUNTER — Ambulatory Visit (HOSPITAL_BASED_OUTPATIENT_CLINIC_OR_DEPARTMENT_OTHER): Payer: Medicare Other | Admitting: Oncology

## 2011-08-02 ENCOUNTER — Ambulatory Visit: Payer: Self-pay

## 2011-08-02 ENCOUNTER — Other Ambulatory Visit (HOSPITAL_BASED_OUTPATIENT_CLINIC_OR_DEPARTMENT_OTHER): Payer: Medicare Other | Admitting: Lab

## 2011-08-02 ENCOUNTER — Encounter: Payer: Self-pay | Admitting: Oncology

## 2011-08-02 VITALS — BP 143/70 | HR 78 | Temp 98.4°F | Ht 72.0 in | Wt 177.2 lb

## 2011-08-02 DIAGNOSIS — C109 Malignant neoplasm of oropharynx, unspecified: Secondary | ICD-10-CM

## 2011-08-02 DIAGNOSIS — C770 Secondary and unspecified malignant neoplasm of lymph nodes of head, face and neck: Secondary | ICD-10-CM

## 2011-08-02 DIAGNOSIS — N189 Chronic kidney disease, unspecified: Secondary | ICD-10-CM

## 2011-08-02 DIAGNOSIS — C029 Malignant neoplasm of tongue, unspecified: Secondary | ICD-10-CM

## 2011-08-02 DIAGNOSIS — C801 Malignant (primary) neoplasm, unspecified: Secondary | ICD-10-CM

## 2011-08-02 DIAGNOSIS — C01 Malignant neoplasm of base of tongue: Secondary | ICD-10-CM

## 2011-08-02 DIAGNOSIS — R5381 Other malaise: Secondary | ICD-10-CM

## 2011-08-02 LAB — CBC WITH DIFFERENTIAL/PLATELET
BASO%: 0.9 % (ref 0.0–2.0)
EOS%: 0.6 % (ref 0.0–7.0)
HCT: 28.5 % — ABNORMAL LOW (ref 38.4–49.9)
LYMPH%: 10.6 % — ABNORMAL LOW (ref 14.0–49.0)
MCH: 30.2 pg (ref 27.2–33.4)
MCHC: 34.4 g/dL (ref 32.0–36.0)
MCV: 87.7 fL (ref 79.3–98.0)
MONO%: 21.3 % — ABNORMAL HIGH (ref 0.0–14.0)
NEUT%: 66.6 % (ref 39.0–75.0)
Platelets: 281 10*3/uL (ref 140–400)

## 2011-08-02 MED ORDER — FENTANYL 12 MCG/HR TD PT72: 1.0000 | MEDICATED_PATCH | TRANSDERMAL | Status: DC

## 2011-08-02 MED ORDER — PROCHLORPERAZINE MALEATE 10 MG PO TABS: 10.0000 mg | ORAL_TABLET | Freq: Four times a day (QID) | ORAL | Status: DC | PRN

## 2011-08-02 MED ORDER — HYDROCODONE-ACETAMINOPHEN 7.5-500 MG/15ML PO SOLN: 15.0000 mL | Freq: Four times a day (QID) | ORAL | Status: DC | PRN

## 2011-08-02 NOTE — Telephone Encounter (Signed)
Gave pt appt for 08/17/11 lab and ML, called [facility] to cancel chemo today. Called Radiation Onc. To see if they will take pt early since chemo was cancelled

## 2011-08-02 NOTE — Progress Notes (Signed)
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   CURRENT THERAPY: started definitive concurrent chemoradiation with weekly Carboplatin/Taxol on 06/14/2011; and daily XRT 06/21/11.   INTERVAL HISTORY: Jose [name] 78 y.o. male returns for regular follow up.  He is here with his wife.  He has been having more fatigue; and has been less mobile this weekend.  He is still independent of activities of daily living.  Rates pain 7/10, but improved with Lortab elixir PRN. He still has Fentanyl patch 45mcg/hr and takes Lortab elixir prn breakthrough pain.  He is now 100% dependent of his PEG tube; he is not very adherent to his swallowing exercises; however, he is still able to drink some water. Has not yet seen ST, but plan to refer once treatment completed. His cervical adenopathy has significantly improved.  He has developed skin rash in the neck from treatment; he is using Biafine cream daily.  He is adherent to mouth rinses with salt/baking soda alternating with diluted H2O2.  He denies problem with his PEG tube.   Patient denies headache, visual changes, confusion, drenching night sweats, palpable lymph node swelling, nausea vomiting, jaundice, chest pain, palpitation, shortness of breath, dyspnea on exertion, productive cough, gum bleeding, epistaxis, hematemesis, hemoptysis, abdominal pain, abdominal swelling, early satiety, melena, hematochezia, hematuria, skin rash, spontaneous bleeding, joint swelling, joint pain, heat or cold intolerance, bowel bladder incontinence, back pain, focal motor weakness, paresthesia, depression, suicidal or homocidal ideation, feeling hopelessness.   Past Medical History  Diagnosis Date  . Blood transfusion     2006  had 4 units  . Arthritis     knees,back,shoulders  . Anxiety   . Right ureteral stone S/P URETERAL STENT 01-24-11  AND ESWL  03-01-11  . History of acute renal failure 01-23-11    DUE TO HYDRONEPHROSIS AND RIGHT STONE  . Normal cardiac stress test 09-25-2007  . Glaucoma   . Cataract immature   . Diverticulosis of colon   . History of GI diverticular bleed   . Hyperlipemia   . Impaired hearing NOT WEARING HIS BILATERL AIDS  . Urgency of urination     DUE TO URETERAL STONE AND STENT  . Frequency of urination   . Insomnia   . Anemia   . Chronic kidney disease     kidney stones  . GERD (gastroesophageal reflux disease)   . Hernia     inguinal  . Headache     migraines  . Glaucoma   . Hearing impairment     bilateral  . Trigeminal neuralgia   . Hypertension   . Male breast cancer 1988    S/P RIGHT MASTECTOMY W/ NODE DISSECTION AND CHEMO  . 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 Skin cancer 2002    melanoma- back    Past Surgical History  Procedure Date  . Cystoscopy w/ ureteral stent placement 01/24/2011    Procedure: CYSTOSCOPY WITH RETROGRADE PYELOGRAM/URETERAL STENT PLACEMENT;  Surgeon: Antony Haste, MD;  Location: [facility];  Service: Urology;  Laterality: Right;  . Right leg surg for fx AGE 76    patient states left thigh  . Craniectomy suboccipital for exploration / decompression cranial nerves 2003    5TH CRANIAL  NERVE DECOMPRESSION  . Simple mastectomy 1988    MALE--- RIGHT BREAST W/ NODE DISSECTION  . Melanoma excision 10 YRS AGO    BACK AREA  . Extracorporeal shock wave lithotripsy 03-01-11    RIGHT  . Ureteroscopy 03/26/2011    Procedure: URETEROSCOPY;  Surgeon: Antony Haste, MD;  Location: [facility];  Service: Urology;  Laterality: Right;  RIGHT URETEROSCOPY, HOLMIUM LASER AND STENT C-ARM HOLMIUM LASER  . Examination under anesthesia 03/26/2011    Procedure: EXAM UNDER ANESTHESIA;  Surgeon: Antony Haste, MD;  Location: [facility];  Service: Urology;  Laterality: N/A;  . Lymph node biopsy 04/21/2011    Procedure: LYMPH NODE BIOPSY;  Surgeon: Susy Frizzle, MD;  Location: [facility];  Service: ENT;  Laterality: Right;    Current Outpatient Prescriptions  Medication Sig Dispense Refill  . amLODipine (NORVASC) 10 MG tablet Take 10 mg by mouth every morning.       . brimonidine (ALPHAGAN) 0.2 % ophthalmic solution Place 1 drop into both eyes 2 (two) times daily.  5 mL    . fentaNYL (DURAGESIC - DOSED MCG/HR) 12 MCG/HR Place 1 patch (12.5 mcg total) onto the skin every 3 (three) days.  10 patch  0  . gabapentin (NEURONTIN) 300 MG capsule Take 600 mg by mouth 2 (two) times daily before lunch and supper.       Marland Kitchen HYDROcodone-acetaminophen (LORTAB) 7.5-500 MG/15ML solution Take 15 mLs by mouth every 6 (six) hours as needed.  480 mL  0  . LORazepam (ATIVAN) 0.5 MG tablet Take 0.5 mg by mouth 2 (two) times daily as needed. ANXIETY       . Multiple Vitamins-Minerals (MULTIVITAMIN WITH MINERALS) tablet Take 1 tablet by mouth daily.       . Nutritional Supplements (FEEDING SUPPLEMENT, OSMOLITE 1.5 CAL,) LIQD Increase Osmolite 1.5 via feeding tube to 1.5 cans QID with 60 cc water before and after each bolus TF.  Add 240 cc free water TID between feedings as tolerated.  1422 mL  0  . ondansetron (ZOFRAN) 8 MG tablet Take 1 tab two times a day starting the day after chemo for 3 days. Then take 1 tab two times a day as needed for nausea or vomiting.  30 tablet  1  . oxymetazoline (AFRIN) 0.05 % nasal spray Place 1 spray into the nose 2 (two) times daily as needed. ALLERGIES       . prochlorperazine (COMPAZINE) 10 MG tablet Take 1 tablet (10 mg total) by mouth every 6 (six) hours as needed (Nausea or vomiting).  60 tablet  1  . traMADol (ULTRAM) 50 MG tablet Take 50 mg by mouth daily as needed. Maximum dose= 8 tablets per day For pain      . Travoprost, BAK Free, (TRAVATAMN) 0.004 % SOLN  ophthalmic solution Place 1 drop into both eyes at bedtime.      . vitamin C (ASCORBIC ACID) 500 MG tablet Take 1,000 mg by mouth daily.       . vitamin E 400 UNIT capsule Take 400 Units by mouth daily.       Marland Kitchen DISCONTD: amLODipine (NORVASC) 10 MG tablet Take 1 tablet (10 mg total) by mouth daily.  30 tablet  0  . DISCONTD: prochlorperazine (COMPAZINE) 10 MG tablet Take 1 tablet (10 mg total) by mouth every 6 (six) hours as needed (Nausea or vomiting).  30 tablet  1   No current facility-administered medications for this visit.  Facility-Administered Medications Ordered in Other Visits  Medication Dose Route Frequency Provider Last Rate Last Dose  . topical emolient (BIAFINE) emulsion   Topical PRN Jonna Coup, MD        ALLERGIES:  is allergic to hydromorphone; morphine and related; and codeine.  REVIEW OF SYSTEMS:  The rest of the 14-point review of system was negative.   Filed Vitals:   08/02/11 0921  BP: 143/70  Pulse: 78  Temp: 98.4 F (36.9 C)   Wt Readings from Last 3 Encounters:  08/02/11 177 lb 3.2 oz (80.377 kg)  07/30/11 174 lb 1.6 oz (78.971 kg)  07/29/11 174 lb 1.6 oz (78.971 kg)   ECOG Performance status: 1-2  PHYSICAL EXAMINATION:   General:  Thin, tired appearing man, in no acute distress.  Eyes:  no scleral icterus.  ENT:  There was soft palate erythema from treatment without thrush.  Neck was without thyromegaly.  Lymphatics:  Negative cervical, supraclavicular or axillary adenopathy.  Skin of his neck was red and flaky but no open/weeping wound.  Respiratory: lungs were clear bilaterally without wheezing or crackles.  Cardiovascular:  Regular rate and rhythm, S1/S2, without murmur, rub or gallop.  There was no pedal edema.  GI:  abdomen was soft, flat, nontender, nondistended, without organomegaly.  PEG tube dry/clean/intact.  Muscoloskeletal:  no spinal tenderness of palpation of vertebral spine.  Skin exam was without echymosis, petichae.  Neuro exam was  nonfocal.  Patient was able to get on and off exam table without assistance.  Gait was normal.  Patient was alerted and oriented.  Attention was good.   Language was appropriate.  Mood was normal without depression.  Speech was not pressured.  Thought content was not tangential.     LABORATORY/RADIOLOGY DATA:  Lab Results  Component Value Date   WBC 3.2* 08/02/2011   HGB 9.8* 08/02/2011   HCT 28.5* 08/02/2011   PLT 281 08/02/2011   GLUCOSE 169* 07/26/2011   CHOL  Value: 217        ATP III CLASSIFICATION:  <200     mg/dL   Desirable  161-096  mg/dL   Borderline High  >=045    mg/dL   High* 06/28/8117   TRIG 150* 09/16/2007   HDL 23* 09/16/2007   LDLCALC  Value: 164        Total Cholesterol/HDL:CHD Risk Coronary Heart Disease Risk Table                     Men   Women  1/2 Average Risk   3.4   3.3* 09/16/2007   ALKPHOS 69 07/19/2011   ALT 19 07/19/2011   AST 18 07/19/2011   NA 138 07/26/2011   K 4.6 07/26/2011   CL 102 07/26/2011   CREATININE 1.37* 07/26/2011   BUN 34* 07/26/2011   CO2 31 07/26/2011   INR 0.97 05/17/2011   HGBA1C 6.1* 01/24/2011    ASSESSMENT AND PLAN:   1. HLP: He is on diet control.  2. HTN: He is on Amlodipine.  3. Trigeminal neuralgia: He is on neurontin and tramadol.  4. History of smoking: He quit in 1982.  5. History of kidney stone in 2012 s/p stent.  6. History of male breast cancer in 1989: S/p mastectomy; s/p adjuvant chemoradiation; s/p 5 years of adjuvant Tamoxifen in distant past. He is in remission from this cancer.  7. Chronic Kidney disease: Stable Cr.  8. Right lung nodule uptake: Low suspicion for met or  lung cancer at this time. He has no evidence of active pneumonia. Will pay attention to this on follow up PET.  9. 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 He is s/p 6 weekly doses of Carboplatin/Taxol with grade 1-2 fatigue, grade 1 anorexia/weight loss, grade 1 anemia, grade 2  mucositis.  Patient does not want to proceed with chemotherapy today due to fatigue. Will plan to hold his chemotherapy today. This is his last scheduled dose of chemotherapy and no further chemotherapy is planned at this time. Plan to bring him back to see me in about 10-14 days to monitor side effects. Will schedule PET scan 3-4 months after completion of treatment. Will refer to Speech Therapy at next visit. 10. Mucositis prevention: I advised him again to continue with salt/baking soda mouth rinse alternating with diluted H2O2 (1:4 dilution) to thin out thick phlegm.  11. Mild calorie-protein malnutrition: He is now PEG tube dependent.  His weight is relatively stable.  I advised him to be more adherent to swallowing exercises to decrease the risk of esophageal stricture.  12. Nausea/vomiting prophy: He has compazine/zofran/ativan prn. I refilled Compazine today. 13. Pain. Unable to swallow. Continue fentanyl patch and Lortab when necessary.   This regimen is working well for him. I refilled Fentanyl patch and Lortab elixir today. 14. Follow up: 10-14 days.  The length of time of the face-to-face encounter was 15 minutes. More than 50% of time was spent counseling and coordination of care.

## 2011-08-03 ENCOUNTER — Ambulatory Visit
Admission: RE | Admit: 2011-08-03 | Discharge: 2011-08-03 | Disposition: A | Discharge status: Home / Self Care | Payer: Medicare Other | Source: Ambulatory Visit | Attending: Radiation Oncology | Admitting: Radiation Oncology

## 2011-08-04 ENCOUNTER — Ambulatory Visit
Admission: RE | Admit: 2011-08-04 | Discharge: 2011-08-04 | Disposition: A | Discharge status: Home / Self Care | Payer: Medicare Other | Source: Ambulatory Visit | Attending: Radiation Oncology | Admitting: Radiation Oncology

## 2011-08-04 ENCOUNTER — Encounter: Payer: Self-pay | Admitting: *Deleted

## 2011-08-04 DIAGNOSIS — C01 Malignant neoplasm of base of tongue: Secondary | ICD-10-CM

## 2011-08-04 MED ORDER — BIAFINE EX EMUL
CUTANEOUS | Status: DC | PRN
  Administered 2011-08-04: 13:00:00 via TOPICAL

## 2011-08-04 NOTE — Progress Notes (Signed)
Department of Radiation Oncology  Phone:  [phone] Fax:        [phone]  Weekly Treatment Note    Name: Jose [name] Date: 08/04/2011 MRN: [id number] DOB: [birth year]   Current dose: 69.96 gray  Current fraction: 33   MEDICATIONS: Current Outpatient Prescriptions  Medication Sig Dispense Refill  . amLODipine (NORVASC) 10 MG tablet Take 10 mg by mouth every morning.       . brimonidine (ALPHAGAN) 0.2 % ophthalmic solution Place 1 drop into both eyes 2 (two) times daily.  5 mL    . fentaNYL (DURAGESIC - DOSED MCG/HR) 12 MCG/HR Place 1 patch (12.5 mcg total) onto the skin every 3 (three) days.  10 patch  0  . gabapentin (NEURONTIN) 300 MG capsule Take 600 mg by mouth 2 (two) times daily before lunch and supper.       Marland Kitchen HYDROcodone-acetaminophen (LORTAB) 7.5-500 MG/15ML solution Take 15 mLs by mouth every 6 (six) hours as needed.  480 mL  0  . LORazepam (ATIVAN) 0.5 MG tablet Take 0.5 mg by mouth 2 (two) times daily as needed. ANXIETY       . Multiple Vitamins-Minerals (MULTIVITAMIN WITH MINERALS) tablet Take 1 tablet by mouth daily.       . Nutritional Supplements (FEEDING SUPPLEMENT, OSMOLITE 1.5 CAL,) LIQD Increase Osmolite 1.5 via feeding tube to 1.5 cans QID with 60 cc water before and after each bolus TF.  Add 240 cc free water TID between feedings as tolerated.  1422 mL  0  . ondansetron (ZOFRAN) 8 MG tablet Take 1 tab two times a day starting the day after chemo for 3 days. Then take 1 tab two times a day as needed for nausea or vomiting.  30 tablet  1  . oxymetazoline (AFRIN) 0.05 % nasal spray Place 1 spray into the nose 2 (two) times daily as needed. ALLERGIES       . prochlorperazine (COMPAZINE) 10 MG tablet Take 1 tablet (10 mg total) by mouth every 6 (six) hours as needed (Nausea or vomiting).  60 tablet  1  . traMADol (ULTRAM) 50 MG tablet Take 50 mg by mouth daily as needed. Maximum dose= 8 tablets per day For pain      . Travoprost, BAK Free, (TRAVATAMN)  0.004 % SOLN ophthalmic solution Place 1 drop into both eyes at bedtime.      . vitamin C (ASCORBIC ACID) 500 MG tablet Take 1,000 mg by mouth daily.       . vitamin E 400 UNIT capsule Take 400 Units by mouth daily.       Marland Kitchen DISCONTD: amLODipine (NORVASC) 10 MG tablet Take 1 tablet (10 mg total) by mouth daily.  30 tablet  0   No current facility-administered medications for this encounter.   Facility-Administered Medications Ordered in Other Encounters  Medication Dose Route Frequency Provider Last Rate Last Dose  . topical emolient (BIAFINE) emulsion   Topical PRN Jonna Coup, MD         ALLERGIES: Hydromorphone; Morphine and related; and Codeine   LABORATORY DATA:  Lab Results  Component Value Date   WBC 3.2* 08/02/2011   HGB 9.8* 08/02/2011   HCT 28.5* 08/02/2011   MCV 87.7 08/02/2011   PLT 281 08/02/2011   Lab Results  Component Value Date   NA 138 07/26/2011   K 4.6 07/26/2011   CL 102 07/26/2011   CO2 31 07/26/2011   Lab Results  Component Value Date   ALT  19 07/19/2011   AST 18 07/19/2011   ALKPHOS 69 07/19/2011   BILITOT 0.4 07/19/2011     NARRATIVE: Jose [name] was seen today for weekly treatment management. The chart was checked and the patient's films were reviewed. The patient finished today. His symptoms have been stable with some ongoing skin irritation and significant mucositis and thickened saliva. He is on a fentanyl patch and does take Lortab elixir for pain when necessary. No new complaints today.  PHYSICAL EXAMINATION: vitals were not taken for this visit.     diffuse irritation of the skin in the neck area including posteriorly. There is some buildup as well with desquamation. Thickened saliva and mucositis present within the oral cavity.  ASSESSMENT: The patient did satisfactorily with treatment.  PLAN: The patient will return to clinic in 1 month for followup. I discussed with him skin care that I recommended for him to continue to do for the next couple  of weeks.

## 2011-08-04 NOTE — Progress Notes (Signed)
Patient alert,oriented x 3, coughing up thick, tenacious pheglm,clear/whwite color, has 12.5mcg fentanyl patch, takes lortab elixir prn, dry desquamation on neck, erythema, vitals wnl, uses 6 cans osmolite daily with free water flushes, no c/o pain today, no nausea today ,completed treatment today,gave 1 month f/u appt card 12:01 PM

## 2011-08-04 NOTE — Progress Notes (Signed)
Encounter addended by: Lowella Petties, RN on: 08/04/2011  1:11 PM<BR>     Documentation filed: Orders

## 2011-08-04 NOTE — Progress Notes (Signed)
Encounter addended by: Lowella Petties, RN on: 08/04/2011  1:14 PM<BR>     Documentation filed: Inpatient MAR

## 2011-08-05 ENCOUNTER — Ambulatory Visit: Payer: Medicare Other

## 2011-08-06 ENCOUNTER — Ambulatory Visit: Payer: Medicare Other

## 2011-08-09 ENCOUNTER — Telehealth: Payer: Self-pay | Admitting: *Deleted

## 2011-08-09 NOTE — Telephone Encounter (Signed)
Patient wife called stating Patient is sleeping more, and thinks he's getting too much pain medication ,can;'t understand his speech,incoherent some, asked if patient had a fever,she stated"No, he's just sleeping more,,Asked how often patient is getting his lortab elixir, and patient is on fentanyl patch,  She is giving this 2 x day, she gave it via peg tube this am but is going to hold off anymore today, will see how he is doing without lortab and if not any better tomorrow she will call EMS and take him to the emergency room, agreed with her,will check tomorrow on patient status 9:51 AM

## 2011-08-10 ENCOUNTER — Telehealth: Payer: Self-pay | Admitting: *Deleted

## 2011-08-10 NOTE — Progress Notes (Signed)
Encounter addended by: Glennie Hawk, RN on: 08/10/2011  2:38 PM<BR>     Documentation filed: Charges VN

## 2011-08-10 NOTE — Telephone Encounter (Signed)
Called wife, she stated"he is doing so much better this morning, alert oriented, I could come to work today ,I didn't give him any lortab elixir last night,he only has the fentanyl patch and that was fine" thanked this RN for the follow up call back 11:29 AM

## 2011-08-10 NOTE — Progress Notes (Signed)
Encounter addended by: Glennie Hawk, RN on: 08/10/2011  2:19 PM<BR>     Documentation filed: Charges VN

## 2011-08-11 ENCOUNTER — Telehealth: Payer: Self-pay | Admitting: *Deleted

## 2011-08-11 MED ORDER — AMLODIPINE BESYLATE 10 MG PO TABS: 10.0000 mg | ORAL_TABLET | ORAL | Status: DC

## 2011-08-11 NOTE — Telephone Encounter (Signed)
Wife reports new swelling in bilat ankles and feet x 3 to 4 days.  Swelling improves w/ keeping feet elevated but comes back towards end of the day.  Denies any other new problems or concerns.  Doe

## 2011-08-11 NOTE — Telephone Encounter (Signed)
Reported swelling to Dr. Gaylyn Rong and he instructed for pt to keep feet elevated as much as possible and to call if it gets worse.  Keep appt w/ Clenton Pare next week as scheduled.   Called wife back w/ above instructions.  She verbalized understanding.

## 2011-08-12 ENCOUNTER — Telehealth: Payer: Self-pay | Admitting: *Deleted

## 2011-08-12 NOTE — Telephone Encounter (Signed)
Call from pt's wife states pt very nauseated last night w/ vomiting.  They called and s/w a on call MD who instructed pt to increase compazine to q 4hrs.  States has not been taking zofran since chemo was stopped 2 weeks ago.  I instructed wife to have pt start zofran again q 12hrs for at least next 3 days and then compazine q 6hrs prn any n/v.  Encouraged to drink plenty of fluids and to call back if not better.  Asked her to call back tomorrow morning regardless to let us know how he is doing.  She verbalized understanding.

## 2011-08-13 ENCOUNTER — Telehealth: Payer: Self-pay | Admitting: *Deleted

## 2011-08-13 NOTE — Telephone Encounter (Signed)
Call from wife stating pt still nauseated last night in spite of taking zofran q 12 hrs.  She did not give him any compazine or ativan, zofran only.  Instructed wife to give 1. Zofran q 12 hrs for next 3 days.  2.  Compazine q 6hrs prn any n/v   3.  Ativan q 6 hrs prn any n/v not relieved by #1 and #2.    Instructed pt may take all 3 nausea meds alternating throughout the day and to call back if pt continues to be nauseated in spite of taking all 3 anti emetics.  Wife verbalized understanding.

## 2011-08-17 ENCOUNTER — Telehealth: Payer: Self-pay | Admitting: Oncology

## 2011-08-17 ENCOUNTER — Other Ambulatory Visit (HOSPITAL_BASED_OUTPATIENT_CLINIC_OR_DEPARTMENT_OTHER): Payer: Medicare Other | Admitting: Lab

## 2011-08-17 ENCOUNTER — Ambulatory Visit (HOSPITAL_BASED_OUTPATIENT_CLINIC_OR_DEPARTMENT_OTHER): Payer: Medicare Other | Admitting: Oncology

## 2011-08-17 ENCOUNTER — Encounter: Payer: Self-pay | Admitting: Oncology

## 2011-08-17 VITALS — BP 144/69 | HR 84 | Temp 98.6°F | Ht 72.0 in | Wt 171.2 lb

## 2011-08-17 DIAGNOSIS — C801 Malignant (primary) neoplasm, unspecified: Secondary | ICD-10-CM

## 2011-08-17 DIAGNOSIS — C01 Malignant neoplasm of base of tongue: Secondary | ICD-10-CM

## 2011-08-17 DIAGNOSIS — C109 Malignant neoplasm of oropharynx, unspecified: Secondary | ICD-10-CM

## 2011-08-17 DIAGNOSIS — R911 Solitary pulmonary nodule: Secondary | ICD-10-CM

## 2011-08-17 LAB — CBC WITH DIFFERENTIAL/PLATELET
BASO%: 1.3 % (ref 0.0–2.0)
LYMPH%: 9.3 % — ABNORMAL LOW (ref 14.0–49.0)
MCHC: 34.1 g/dL (ref 32.0–36.0)
MCV: 90.8 fL (ref 79.3–98.0)
MONO#: 0.8 10*3/uL (ref 0.1–0.9)
MONO%: 13.6 % (ref 0.0–14.0)
Platelets: 322 10*3/uL (ref 140–400)
RBC: 3.55 10*6/uL — ABNORMAL LOW (ref 4.20–5.82)
WBC: 5.9 10*3/uL (ref 4.0–10.3)
nRBC: 0 % (ref 0–0)

## 2011-08-17 LAB — COMPREHENSIVE METABOLIC PANEL
ALT: 21 U/L (ref 0–53)
AST: 20 U/L (ref 0–37)
Creatinine, Ser: 1.4 mg/dL — ABNORMAL HIGH (ref 0.50–1.35)
Total Bilirubin: 0.2 mg/dL — ABNORMAL LOW (ref 0.3–1.2)

## 2011-08-17 MED ORDER — ONDANSETRON HCL 8 MG PO TABS: ORAL_TABLET | ORAL | Status: DC

## 2011-08-17 MED ORDER — MEGESTROL ACETATE 40 MG/ML PO SUSP: 800.0000 mg | Freq: Every day | ORAL | Status: AC

## 2011-08-17 NOTE — Telephone Encounter (Signed)
Speech therapy consult has been scheduled and pt aware of appt

## 2011-08-17 NOTE — Telephone Encounter (Signed)
Gave pt appt calendar for May, June ,July 2013 , pt will have PET Scan and lab before seeing  MD on 12/08/11

## 2011-08-17 NOTE — Progress Notes (Signed)
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   CURRENT THERAPY: Definitive concurrent chemoradiation with weekly Carboplatin/Taxol on 06/14/2011; and daily XRT 06/21/11. Chemotherapy completed on 07/26/11 (last dose held due to fatigue, anorexia, and mucositis). Radiation completed on 08/04/11.  INTERVAL HISTORY: Jose [name] 78 y.o. male returns for regular follow up.  He is here with his wife.  Completed XRT last week. Still with fatigue, but is still independent of activities of daily living. Pain is better and is no longer using Fentanyl patch or Lortab elixir. Remains 100% dependent of his PEG tube; he is not very adherent to his swallowing exercises; however, he is still able to drink some water. Has not yet seen ST, but plan to refer once treatment completed. His cervical adenopathy has significantly improved.  He has developed skin rash in the neck from treatment; he is using Biafine cream daily.  He is adherent to mouth rinses with salt/baking soda alternating with diluted H2O2.  He denies problem with his PEG tube. Using Osmolite 6 cans/day through PEG. Weight down by 6 lbs in 2 weeks.  Patient denies headache, visual changes, confusion, drenching night sweats, palpable lymph node swelling, nausea vomiting, jaundice, chest pain, palpitation, shortness of breath, dyspnea on exertion, productive cough, gum bleeding, epistaxis, hematemesis, hemoptysis, abdominal pain, abdominal swelling, early satiety, melena, hematochezia, hematuria, skin rash, spontaneous bleeding, joint swelling, joint pain, heat or cold intolerance, bowel bladder incontinence, back pain, focal motor weakness, paresthesia, depression, suicidal or homocidal ideation, feeling  hopelessness.   Past Medical History  Diagnosis Date  . Blood transfusion     2006 had 4 units  . Arthritis     knees,back,shoulders  . Anxiety   . Right ureteral stone S/P URETERAL STENT 01-24-11  AND ESWL  03-01-11  . History of acute renal failure 01-23-11    DUE TO HYDRONEPHROSIS AND RIGHT STONE  . Normal cardiac stress test 09-25-2007  . Glaucoma   . Cataract immature   . Diverticulosis of colon   . History of GI diverticular bleed   . Hyperlipemia   . Impaired hearing NOT WEARING HIS BILATERL AIDS  . Urgency of urination     DUE TO URETERAL STONE AND STENT  . Frequency of urination   . Insomnia   . Anemia   . Chronic kidney disease     kidney stones  . GERD (gastroesophageal reflux disease)   . Hernia     inguinal  . Headache     migraines  . Glaucoma   . Hearing impairment     bilateral  . Trigeminal neuralgia   . Hypertension   . Male breast cancer 1988    S/P RIGHT MASTECTOMY W/ NODE DISSECTION AND CHEMO  . 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 Skin cancer 2002    melanoma- back    Past Surgical History  Procedure Date  . Cystoscopy w/ ureteral stent placement 01/24/2011    Procedure: CYSTOSCOPY WITH RETROGRADE PYELOGRAM/URETERAL STENT PLACEMENT;  Surgeon: Antony Haste, MD;  Location: [facility];  Service: Urology;  Laterality: Right;  . Right leg surg for fx AGE 41    patient states left thigh  . Craniectomy suboccipital for exploration / decompression cranial  nerves 2003    5TH CRANIAL NERVE DECOMPRESSION  . Simple mastectomy 1988    MALE--- RIGHT BREAST W/ NODE DISSECTION  . Melanoma excision 10 YRS AGO    BACK AREA  . Extracorporeal shock wave lithotripsy 03-01-11    RIGHT  . Ureteroscopy 03/26/2011    Procedure: URETEROSCOPY;  Surgeon: Antony Haste, MD;  Location: [facility];  Service: Urology;  Laterality: Right;  RIGHT URETEROSCOPY, HOLMIUM LASER AND STENT C-ARM HOLMIUM LASER  . Examination under  anesthesia 03/26/2011    Procedure: EXAM UNDER ANESTHESIA;  Surgeon: Antony Haste, MD;  Location: [facility];  Service: Urology;  Laterality: N/A;  . Lymph node biopsy 04/21/2011    Procedure: LYMPH NODE BIOPSY;  Surgeon: Susy Frizzle, MD;  Location: [facility];  Service: ENT;  Laterality: Right;    Current Outpatient Prescriptions  Medication Sig Dispense Refill  . amLODipine (NORVASC) 10 MG tablet Take 1 tablet (10 mg total) by mouth every morning.  30 tablet  0  . brimonidine (ALPHAGAN) 0.2 % ophthalmic solution Place 1 drop into both eyes 2 (two) times daily.  5 mL    . gabapentin (NEURONTIN) 300 MG capsule Take 600 mg by mouth 2 (two) times daily before lunch and supper.       Marland Kitchen LORazepam (ATIVAN) 0.5 MG tablet Take 0.5 mg by mouth 2 (two) times daily as needed. ANXIETY       . megestrol (MEGACE ORAL) 40 MG/ML suspension Take 20 mLs (800 mg total) by mouth daily.  480 mL  2  . Multiple Vitamins-Minerals (MULTIVITAMIN WITH MINERALS) tablet Take 1 tablet by mouth daily.       . Nutritional Supplements (FEEDING SUPPLEMENT, OSMOLITE 1.5 CAL,) LIQD Increase Osmolite 1.5 via feeding tube to 1.5 cans QID with 60 cc water before and after each bolus TF.  Add 240 cc free water TID between feedings as tolerated.  1422 mL  0  . ondansetron (ZOFRAN) 8 MG tablet Take 1 tab two times a day starting the day after chemo for 3 days. Then take 1 tab two times a day as needed for nausea or vomiting.  30 tablet  1  . oxymetazoline (AFRIN) 0.05 % nasal spray Place 1 spray into the nose 2 (two) times daily as needed. ALLERGIES       . prochlorperazine (COMPAZINE) 10 MG tablet Take 1 tablet (10 mg total) by mouth every 6 (six) hours as needed (Nausea or vomiting).  60 tablet  1  . traMADol (ULTRAM) 50 MG tablet Take 50 mg by mouth daily as needed. Maximum dose= 8 tablets per day For pain      . Travoprost, BAK Free, (TRAVATAMN) 0.004 % SOLN ophthalmic solution Place 1 drop into both eyes at  bedtime.      . vitamin C (ASCORBIC ACID) 500 MG tablet Take 1,000 mg by mouth daily.       . vitamin E 400 UNIT capsule Take 400 Units by mouth daily.       Marland Kitchen DISCONTD: amLODipine (NORVASC) 10 MG tablet Take 1 tablet (10 mg total) by mouth daily.  30 tablet  0   No current facility-administered medications for this visit.   Facility-Administered Medications Ordered in Other Visits  Medication Dose Route Frequency Provider Last Rate Last Dose  . topical emolient (BIAFINE) emulsion   Topical PRN Jonna Coup, MD        ALLERGIES:  is allergic to hydromorphone; morphine and related;  and codeine.  REVIEW OF SYSTEMS:  The rest of the 14-point review of system was negative.   Filed Vitals:   08/17/11 1351  BP: 144/69  Pulse: 84  Temp: 98.6 F (37 C)   Wt Readings from Last 3 Encounters:  08/17/11 171 lb 3.2 oz (77.656 kg)  08/02/11 177 lb 3.2 oz (80.377 kg)  07/30/11 174 lb 1.6 oz (78.971 kg)   ECOG Performance status: 1-2  PHYSICAL EXAMINATION:   General:  Thin, tired appearing man, in no acute distress.  Eyes:  no scleral icterus.  ENT:  There was soft palate erythema from treatment without thrush.  Neck was without thyromegaly.  Lymphatics:  Negative cervical, supraclavicular or axillary adenopathy.  Skin of his neck was red and flaky but no open/weeping wound.  Respiratory: lungs were clear bilaterally without wheezing or crackles.  Cardiovascular:  Regular rate and rhythm, S1/S2, without murmur, rub or gallop.  There was no pedal edema.  GI:  abdomen was soft, flat, nontender, nondistended, without organomegaly.  PEG tube dry/clean/intact.  Muscoloskeletal:  no spinal tenderness of palpation of vertebral spine.  Skin exam was without echymosis, petichae.  Neuro exam was nonfocal.  Patient was able to get on and off exam table without assistance.  Gait was normal.  Patient was alerted and oriented.  Attention was good.   Language was appropriate.  Mood was normal without  depression.  Speech was not pressured.  Thought content was not tangential.     LABORATORY/RADIOLOGY DATA:  Lab Results  Component Value Date   WBC 5.9 08/17/2011   HGB 11.0* 08/17/2011   HCT 32.2* 08/17/2011   PLT 322 08/17/2011   GLUCOSE 169* 07/26/2011   CHOL  Value: 217        ATP III CLASSIFICATION:  <200     mg/dL   Desirable  213-086  mg/dL   Borderline High  >=578    mg/dL   High* 4/69/6295   TRIG 150* 09/16/2007   HDL 23* 09/16/2007   LDLCALC  Value: 164        Total Cholesterol/HDL:CHD Risk Coronary Heart Disease Risk Table                     Men   Women  1/2 Average Risk   3.4   3.3* 09/16/2007   ALKPHOS 69 07/19/2011   ALT 19 07/19/2011   AST 18 07/19/2011   NA 138 07/26/2011   K 4.6 07/26/2011   CL 102 07/26/2011   CREATININE 1.37* 07/26/2011   BUN 34* 07/26/2011   CO2 31 07/26/2011   INR 0.97 05/17/2011   HGBA1C 6.1* 01/24/2011    ASSESSMENT AND PLAN:   1. HLP: He is on diet control.  2. HTN: He is on Amlodipine.  3. Trigeminal neuralgia: He is on neurontin and tramadol.  4. History of smoking: He quit in 1982.  5. History of kidney stone in 2012 s/p stent.  6. History of male breast cancer in 1989: S/p mastectomy; s/p adjuvant chemoradiation; s/p 5 years of adjuvant Tamoxifen in distant past. He is in remission from this cancer.  7. Chronic Kidney disease: Stable Cr.  8. Right lung nodule uptake: Low suspicion for met or lung cancer at this time. He has no evidence of active pneumonia. Will pay attention to this on follow up PET.  9. 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 He is  s/p 6 weekly doses of Carboplatin/Taxol and XRT with grade 1-2 fatigue, grade 1 anorexia/weight loss, grade 1 anemia, grade 2 mucositis.  Discussed with patient and wife that side effects will slowly improve off treatment. I have scheduled a PET scan in about 4 months. Will refer to Speech Therapy for swallowing  exercises. 10. Mucositis prevention: I advised him again to continue with salt/baking soda mouth rinse alternating with diluted H2O2 (1:4 dilution) to thin out thick phlegm.  11. Mild calorie-protein malnutrition: He is now PEG tube dependent.  Weight down by 6 lbs. I have prescribed Megace for him. 12. Nausea/vomiting prophy: He has compazine/zofran/ativan prn. This should improve off treatment.  13. Pain. Unable to swallow. No longer on pain medications. 14. Follow up: In about 4 months to review PET scan. Will check CBC, CMET, and TSH at next visit.  The length of time of the face-to-face encounter was 15 minutes. More than 50% of time was spent counseling and coordination of care.

## 2011-08-18 ENCOUNTER — Ambulatory Visit: Payer: Medicare Other | Attending: Oncology

## 2011-08-18 DIAGNOSIS — IMO0001 Reserved for inherently not codable concepts without codable children: Secondary | ICD-10-CM | POA: Insufficient documentation

## 2011-08-18 DIAGNOSIS — R1312 Dysphagia, oropharyngeal phase: Secondary | ICD-10-CM | POA: Insufficient documentation

## 2011-08-23 ENCOUNTER — Telehealth: Payer: Self-pay | Admitting: *Deleted

## 2011-08-23 NOTE — Telephone Encounter (Signed)
Wife call this morning to report pt having increased restlessness and insomnia since starting on Megace.  Pt continues to feel full and bloated after taking 2 cans of Ensure.   Instructed wife ok for pt to hold the Megace,  It is to increase appetite and pt has not noticed any improvement in appetite.  Also instructed for pt to take only one can of Ensure at a time and wait at least one hour between cans to avoid feeling so full and bloated.  She verbalized understanding.

## 2011-08-27 ENCOUNTER — Encounter: Payer: Self-pay | Admitting: Nutrition

## 2011-08-27 ENCOUNTER — Telehealth: Payer: Self-pay

## 2011-08-27 NOTE — Progress Notes (Signed)
I received a message that Jose [name] was running low on Osmolite 1.5 and was requesting that I contact [facility] for him to order additional TF.  I did call [facility] and spoke to the nutrition care team and they will contact patient and ship his TF order as soon as possible.

## 2011-08-27 NOTE — Telephone Encounter (Signed)
Received call from pt's wife stating that pt will need more Osmolite, as he is not taking much by mouth still.  She states he uses about 6 cans/day, and he will need more than 1 case.  She states the dietician here usually handles ordering this from [facility].  Informed her will leave message for [facility] and Dr. Lodema Pilot nurse, and have office call her back.

## 2011-09-01 ENCOUNTER — Encounter: Payer: Self-pay | Admitting: Radiation Oncology

## 2011-09-06 ENCOUNTER — Telehealth: Payer: Self-pay | Admitting: *Deleted

## 2011-09-06 NOTE — Telephone Encounter (Signed)
Wife called asking if Dr.Moody could call in a sleeping medication via peg tube for patient,"he isn't sleeping well at night,"nothing by mouth, still has thickened  Saliva, cnnot do the hydrogen peroxide,it burns his mouth, takes osmilite 1.5 via peg, ativan at night via peg doesn't help much either, asked if he was doing his exercises, she doesn't think he is but she works during the days so she doesn't  Really know, informed her Dr.Moody was off and I would get with another MD and call her back as soon as I know something,.wife thanked [place] for the call back Will wait to hear status later today 11:46 AM

## 2011-09-06 NOTE — Telephone Encounter (Signed)
Called and left vm per Dr. Kathrynn Running, he suggested patient to take liquid benadryl Over The Counter, take as needed sleep,  She can get this  at any drug store, call for any further concerns 3:29 PM

## 2011-09-10 ENCOUNTER — Telehealth: Payer: Self-pay | Admitting: Nutrition

## 2011-09-10 NOTE — Telephone Encounter (Signed)
I called patient at home to follow up on TF tolerance.  Patient did not answer and I was unable to leave a message.  Patient does have my contact information if needed.

## 2011-09-14 NOTE — Progress Notes (Signed)
   Department of Radiation Oncology  Phone:  [phone] Fax:        [phone] ________________________________  Name:Jose [name] MRN: [id number]  Date: 06/21/2011  DOB: 1927-05-06   IMRT device note  The patient presented for his first fraction of radiotherapy today. And IMRT device therefore was constructed for this treatment. The patient's IMRT plan was reviewed and this was constructed and will deliver 10.3 field widths. This corresponds to his tomotherapy unit treatment. This IMRT device therefore will be used for each fraction of radiation and this is satisfactory for [place] to proceed with his treatment.   ________________________________   Radene Gunning, MD, PhD

## 2011-09-14 NOTE — Progress Notes (Signed)
  Radiation Oncology         (336) [phone] ________________________________  Name: Jose [name] MRN: [id number]  Date: 06/09/2011  DOB: 15-Apr-1927  SIMULATION AND TREATMENT PLANNING NOTE   CONSENT VERIFIED: yes   SET UP: Patient is set-up supine   IMMOBILIZATION: The following immobilization is used:Aquaplast Mask. This complex treatment device will be used on a daily basis during the patient's treatment.  Diagnosis:  Cell carcinoma of the base of [place]   NARRATIVE: The patient was brought to the CT Simulation planning suite.  Identity was confirmed.  All relevant records and images related to the planned course of therapy were reviewed.  Then, the patient was positioned in a stable reproducible clinical set-up for radiation therapy using an aquaplast mask.  Skin markings were placed.  The CT images were loaded into the planning software where the target and avoidance structures were contoured.The radiation prescription was entered and confirmed.  The patient will receive 69.96 Gy in 33 fractions.  Daily image guidance is ordered, and this will be used on a daily basis. This is necessary to ensure accurate and precise localization of the target in addition to accurate alignment of the normal tissue structures in this region.  Treatment planning then occurred.  I have requested : Intensity Modulated Radiotherapy (IMRT) is medically necessary for this case for the following reason:  Dose homogeneity and treatment of a head and neck site. The target is in close proximity to critical normal structures, as well as the parotid glands which may negatively impact the patient's quality of life if not maximally spared given the constraints of the overall treatment plan. IMRT is thus medically necessary to appropriately treat the patient.   Special treatment procedure The patient will receive chemotherapy during the course of radiation treatment. The patient may experience increased or  overlapping toxicity due to this combined-modality approach and the patient will be monitored for such problems. This may include extra lab work as necessary. This therefore constitutes a special treatment procedure.    ________________________________   Radene Gunning, MD, PhD

## 2011-09-14 NOTE — Progress Notes (Signed)
  Radiation Oncology         (336) [phone] ________________________________  Name: Jose [name] MRN: [id number]  Date: 08/04/2011  DOB: 03-18-1928  End of Treatment Note  Diagnosis:   Squamous cell carcinoma of the base of time     Indication for treatment:  Curative       Radiation treatment dates:   06/21/2011 through 08/04/2011  Site/dose:   The patient was treated with a simultaneous integrated boost technique. This consisted of a IMRT  Technique on her Tomotherapy daily image guidance was used as well to ensure accurate localization of the target. The high dose region received 69.96 gray in 33 fractions.unit.  Narrative: The patient was able to complete his prescribed course of radiation. He did have some difficulty during the course of his treatment with expected acute toxicity. Unfortunately his performance status did appear to decline and the treatment overall to that whole on him during his treatment. His major difficulties included some significant soreness of his mouth and throat, and increased thick saliva. This was associated with significant pain as well towards the end of his treatment especially.   Plan: The patient has completed radiation treatment. The patient will return to radiation oncology clinic for routine followup in one month. I advised the patient to call or return sooner if they have any questions or concerns related to their recovery or treatment. ________________________________  Radene Gunning, M.D., Ph.D.

## 2011-09-16 DIAGNOSIS — Z853 Personal history of malignant neoplasm of breast: Secondary | ICD-10-CM | POA: Insufficient documentation

## 2011-09-17 ENCOUNTER — Encounter: Payer: Self-pay | Admitting: Radiation Oncology

## 2011-09-17 ENCOUNTER — Ambulatory Visit
Admission: RE | Admit: 2011-09-17 | Discharge: 2011-09-17 | Disposition: A | Discharge status: Home / Self Care | Payer: Medicare Other | Source: Ambulatory Visit | Attending: Radiation Oncology | Admitting: Radiation Oncology

## 2011-09-17 VITALS — BP 121/71 | HR 66 | Temp 97.9°F | Resp 20 | Wt 168.5 lb

## 2011-09-17 DIAGNOSIS — C01 Malignant neoplasm of base of tongue: Secondary | ICD-10-CM

## 2011-09-17 MED ORDER — HYDROCODONE-ACETAMINOPHEN 7.5-500 MG/15ML PO SOLN: 15.0000 mL | Freq: Four times a day (QID) | ORAL | Status: AC | PRN

## 2011-09-17 NOTE — Progress Notes (Signed)
143  Radiation Oncology         (336) [phone] ________________________________  Name: Jose [name] MRN: [id number]  Date: 09/17/2011  DOB: [birth year]  Follow-Up Visit Note  CC: Rogelia Boga, MD  Serena Colonel, MD  Diagnosis:  Squamous cell carcinoma of the base of tongue  Interval Since Last Radiation:  One month   Narrative:  The patient returns today for routine follow-up.  The patient has been recovering from his radiation treatment. Overall he does feel better. His skin has significantly improved since the end of treatment. He also indicates some improvement in discomfort within the oral cavity. He has been continuing with feeding tube meals and has been taking in 5 cans per day. He does note when he attempts to swallow sometimes this does go down "the wrong way." He is scheduled to see the speech therapist coming up in several weeks. He states that he is continuing to do some swallowing exercises.                              ALLERGIES:  is allergic to hydromorphone; morphine and related; and codeine.  Meds: Current Outpatient Prescriptions  Medication Sig Dispense Refill  . amLODipine (NORVASC) 10 MG tablet Take 1 tablet (10 mg total) by mouth every morning.  30 tablet  0  . brimonidine (ALPHAGAN) 0.2 % ophthalmic solution Place 1 drop into both eyes 2 (two) times daily.  5 mL    . gabapentin (NEURONTIN) 300 MG capsule Take 600 mg by mouth 2 (two) times daily before lunch and supper.       Marland Kitchen HYDROcodone-acetaminophen (LORTAB) 7.5-500 MG/15ML solution Take 15 mLs by mouth every 6 (six) hours as needed for pain.  480 mL  0  . LORazepam (ATIVAN) 0.5 MG tablet Take 0.5 mg by mouth 2 (two) times daily as needed. ANXIETY       . megestrol (MEGACE ORAL) 40 MG/ML suspension Take 20 mLs (800 mg total) by mouth daily.  480 mL  2  . Multiple Vitamins-Minerals (MULTIVITAMIN WITH MINERALS) tablet Take 1 tablet by mouth daily.       . Nutritional Supplements (FEEDING SUPPLEMENT,  OSMOLITE 1.5 CAL,) LIQD Increase Osmolite 1.5 via feeding tube to 1.5 cans QID with 60 cc water before and after each bolus TF.  Add 240 cc free water TID between feedings as tolerated.  1422 mL  0  . ondansetron (ZOFRAN) 8 MG tablet Take 1 tab two times a day starting the day after chemo for 3 days. Then take 1 tab two times a day as needed for nausea or vomiting.  30 tablet  1  . oxymetazoline (AFRIN) 0.05 % nasal spray Place 1 spray into the nose 2 (two) times daily as needed. ALLERGIES       . prochlorperazine (COMPAZINE) 10 MG tablet Take 1 tablet (10 mg total) by mouth every 6 (six) hours as needed (Nausea or vomiting).  60 tablet  1  . Travoprost, BAK Free, (TRAVATAMN) 0.004 % SOLN ophthalmic solution Place 1 drop into both eyes at bedtime.      . vitamin C (ASCORBIC ACID) 500 MG tablet Take 1,000 mg by mouth daily.       . vitamin E 400 UNIT capsule Take 400 Units by mouth daily.       Marland Kitchen DISCONTD: amLODipine (NORVASC) 10 MG tablet Take 1 tablet (10 mg total) by mouth daily.  30 tablet  0  No current facility-administered medications for this encounter.   Facility-Administered Medications Ordered in Other Encounters  Medication Dose Route Frequency Provider Last Rate Last Dose  . topical emolient (BIAFINE) emulsion   Topical PRN Jonna Coup, MD        Physical Findings: The patient is in no acute distress. Patient is alert and oriented.  weight is 168 lb 8 oz (76.431 kg). His oral temperature is 97.9 F (36.6 C). His blood pressure is 121/71 and his pulse is 66. His respiration is 20. .   The patient's skin looks much better and has healed very nicely since he finished. No bright erythema in the neck area and no desquamation. Oral cavity is much improved with resolved mucositis. Patient does have some dried secretions appears on the tongue. I was not able to scraping of this off.  Lab Findings: Lab Results  Component Value Date   WBC 5.9 08/17/2011   HGB 11.0* 08/17/2011   HCT  32.2* 08/17/2011   MCV 90.8 08/17/2011   PLT 322 08/17/2011     Radiographic Findings: No results found.  Impression:    The patient has been recovering from his course of radiation treatment and I think he has done satisfactorily this far.  Plan:  I will bring the patient back for a followup visit in 2 months and I will plan to scope him at that time. This was deferred today due to how soon he is out from his treatment and improving clinical symptoms. The patient does have a PET scan scheduled for September.   Radene Gunning, M.D., Ph.D.

## 2011-09-17 NOTE — Progress Notes (Signed)
Pt continues to have fatigue, c/o soreness of tongue, sides of tongue. Pt has white coating on top of tongue, denies sore throat. Pt states he has yellowish liquid pain med he takes every other day, asking for refill. He does not know name of med, not on med list in Epic. Pt continues w/peg tube feedings, Osmolite 5 cans daily, is supposed to take in 6 cans. Wife states he also takes in juice, milk thru peg tube. Pt states when he tries to drink water, soup, "some of it tries to go down wrong hole". Pt has appt w/speech pathologist 10/06/11, and he is aware of this appt. He is doing "some of the swallowing exercises twice/day".   Pt's skin in tx area completely healed.

## 2011-10-06 ENCOUNTER — Telehealth: Payer: Self-pay | Admitting: Nutrition

## 2011-10-06 NOTE — Telephone Encounter (Signed)
Patient's wife called requesting additional Osmolite 1.5.  Patient is on [facility].  Patient continues to tolerate 6 cans of Osmolite 1.5 daily without difficulty.  Patient unable to drink liquids or eat by mouth.  No nutrition issues with TF.  I will contact [facility] for patient and request a delivery of formula for him.

## 2011-10-11 ENCOUNTER — Telehealth: Payer: Self-pay | Admitting: *Deleted

## 2011-10-11 NOTE — Telephone Encounter (Signed)
Wife called, reports pt has ongoing insomnia and OTC sleep aids not helping.  Requesting Rx for Ambien.  Called wife back and instructed to call PCP for insomnia,  Explaining pt not being seen by Dr. Gaylyn Rong as often now that treatment is complete and routine care/meds need to be managed by PCP.  She verbalized understanding of this and also asks about pt's swallowing.  Says "is it normal, he can't even swallow a glass of water?"   Discussed unusual to be unable to swallow water at this point after XRT, but that some pts have more complications than others after XRT.   She confirmed pt continues to see Speech Therapy but she isn't sure if pt keeping up w/ his exercises.  Encouraged her to strongly encourage pt to do his swallowing exercises if he isn't and also suggested pt may need to see ENT MD.  Wife says pt actually has appt to see Dr. Pollyann Kennedy in 2 days.  Assured her that this is the appropriate MD To see at this point for swallowing difficulties and to call back if any further questions/concerns.  She verbalized understanding.

## 2011-10-13 ENCOUNTER — Encounter: Payer: Self-pay | Admitting: Family

## 2011-10-13 ENCOUNTER — Ambulatory Visit (INDEPENDENT_AMBULATORY_CARE_PROVIDER_SITE_OTHER): Payer: Medicare Other | Admitting: Family

## 2011-10-13 VITALS — BP 134/80 | HR 68 | Temp 97.7°F | Wt 166.0 lb

## 2011-10-13 DIAGNOSIS — F0390 Unspecified dementia without behavioral disturbance: Secondary | ICD-10-CM

## 2011-10-13 DIAGNOSIS — G4700 Insomnia, unspecified: Secondary | ICD-10-CM

## 2011-10-13 MED ORDER — ZOLPIDEM TARTRATE 5 MG PO TABS: 5.0000 mg | ORAL_TABLET | Freq: Every evening | ORAL | Status: DC | PRN

## 2011-10-13 NOTE — Progress Notes (Signed)
Subjective:    Patient ID: Jose [name], male    DOB: Dec 21, 1927, 78 y.o.   MRN: [id number]  HPI 78 year old male, nonsmoker, patient of Dr. Cato Mulligan is in today requesting medication to help his insomnia. He has difficulty falling asleep and maintaining sleep. He's tried numerous over-the-counter medications with no relief.   Review of Systems  Constitutional: Negative.   Respiratory: Negative.   Cardiovascular: Negative.   Musculoskeletal: Negative.   Skin: Negative.   Neurological: Negative.   Psychiatric/Behavioral: Positive for disturbed wake/sleep cycle.       Difficulty falling asleep and maintaining sleep   Past Medical History  Diagnosis Date  . Blood transfusion     2006 had 4 units  . Arthritis     knees,back,shoulders  . Anxiety   . Right ureteral stone S/P URETERAL STENT 01-24-11  AND ESWL  03-01-11  . History of acute renal failure 01-23-11    DUE TO HYDRONEPHROSIS AND RIGHT STONE  . Normal cardiac stress test 09-25-2007  . Glaucoma   . Cataract immature   . Diverticulosis of colon   . History of GI diverticular bleed   . Hyperlipemia   . Impaired hearing NOT WEARING HIS BILATERL AIDS  . Urgency of urination     DUE TO URETERAL STONE AND STENT  . Frequency of urination   . Insomnia   . Anemia   . Chronic kidney disease     kidney stones  . GERD (gastroesophageal reflux disease)   . Hernia     inguinal  . Headache     migraines  . Glaucoma   . Hearing impairment     bilateral  . Trigeminal neuralgia   . Hypertension   . Male breast cancer 1988    S/P RIGHT MASTECTOMY W/ NODE DISSECTION AND CHEMO  . 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 Skin cancer 2002    melanoma- back  . History of radiation therapy 06/21/11-08/04/11    tongue/h/n  . History of chemotherapy 07/26/11    taxol/carbo completed/stopped 07/26/11    History   Social History  . Marital Status: Married    Spouse Name: N/A    Number of Children: N/A  . Years of  Education: N/A   Occupational History  . Not on file.   Social History Main Topics  . Smoking status: Former Smoker -- 0.2 packs/day for 30 years    Types: Cigars, Cigarettes    Quit date: 12/20/1980  . Smokeless tobacco: Former Neurosurgeon    Quit date: 12/20/1980  . Alcohol Use: No  . Drug Use: No  . Sexually Active: Yes   Other Topics Concern  . Not on file   Social History Narrative  . No narrative on file    Past Surgical History  Procedure Date  . Cystoscopy w/ ureteral stent placement 01/24/2011    Procedure: CYSTOSCOPY WITH RETROGRADE PYELOGRAM/URETERAL STENT PLACEMENT;  Surgeon: Antony Haste, MD;  Location: [facility];  Service: Urology;  Laterality: Right;  . Right leg surg for fx AGE 78    patient states left thigh  . Craniectomy suboccipital for exploration / decompression cranial nerves 2003    5TH CRANIAL NERVE DECOMPRESSION  . Simple mastectomy 1988    MALE--- RIGHT BREAST W/ NODE DISSECTION  . Melanoma excision 10 YRS AGO    BACK AREA  . Extracorporeal shock wave lithotripsy 03-01-11    RIGHT  . Ureteroscopy 03/26/2011  Procedure: URETEROSCOPY;  Surgeon: Antony Haste, MD;  Location: [facility];  Service: Urology;  Laterality: Right;  RIGHT URETEROSCOPY, HOLMIUM LASER AND STENT C-ARM HOLMIUM LASER  . Examination under anesthesia 03/26/2011    Procedure: EXAM UNDER ANESTHESIA;  Surgeon: Antony Haste, MD;  Location: [facility];  Service: Urology;  Laterality: N/A;  . Lymph node biopsy 04/21/2011    Procedure: LYMPH NODE BIOPSY;  Surgeon: Susy Frizzle, MD;  Location: [facility];  Service: ENT;  Laterality: Right;    Family History  Problem Relation Age of Onset  . Heart disease Mother   . Heart disease Father   . Cancer Sister     "perineum"  . Cancer Paternal Grandmother     stomach    Allergies  Allergen Reactions  . Hydromorphone Other (See Comments)    phlebitis  . Morphine And Related Other (See  Comments)    phlebitis  . Codeine Other (See Comments)    Dizzy, mental status changes    Current Outpatient Prescriptions on File Prior to Visit  Medication Sig Dispense Refill  . amLODipine (NORVASC) 10 MG tablet Take 1 tablet (10 mg total) by mouth every morning.  30 tablet  0  . brimonidine (ALPHAGAN) 0.2 % ophthalmic solution Place 1 drop into both eyes 2 (two) times daily.  5 mL    . gabapentin (NEURONTIN) 300 MG capsule Take 600 mg by mouth 2 (two) times daily before lunch and supper.       Marland Kitchen LORazepam (ATIVAN) 0.5 MG tablet Take 0.5 mg by mouth 2 (two) times daily as needed. ANXIETY       . Nutritional Supplements (FEEDING SUPPLEMENT, OSMOLITE 1.5 CAL,) LIQD Increase Osmolite 1.5 via feeding tube to 1.5 cans QID with 60 cc water before and after each bolus TF.  Add 240 cc free water TID between feedings as tolerated.  1422 mL  0  . ondansetron (ZOFRAN) 8 MG tablet Take 1 tab two times a day starting the day after chemo for 3 days. Then take 1 tab two times a day as needed for nausea or vomiting.  30 tablet  1  . oxymetazoline (AFRIN) 0.05 % nasal spray Place 1 spray into the nose 2 (two) times daily as needed. ALLERGIES       . prochlorperazine (COMPAZINE) 10 MG tablet Take 1 tablet (10 mg total) by mouth every 6 (six) hours as needed (Nausea or vomiting).  60 tablet  1  . Travoprost, BAK Free, (TRAVATAMN) 0.004 % SOLN ophthalmic solution Place 1 drop into both eyes at bedtime.      . Multiple Vitamins-Minerals (MULTIVITAMIN WITH MINERALS) tablet Take 1 tablet by mouth daily.       . vitamin C (ASCORBIC ACID) 500 MG tablet Take 1,000 mg by mouth daily.       . vitamin E 400 UNIT capsule Take 400 Units by mouth daily.       Marland Kitchen zolpidem (AMBIEN) 5 MG tablet Take 1 tablet (5 mg total) by mouth at bedtime as needed for sleep.  15 tablet  3  . DISCONTD: amLODipine (NORVASC) 10 MG tablet Take 1 tablet (10 mg total) by mouth daily.  30 tablet  0   Current Facility-Administered Medications on  File Prior to Visit  Medication Dose Route Frequency Provider Last Rate Last Dose  . topical emolient (BIAFINE) emulsion   Topical PRN Jonna Coup, MD        BP 134/80  Pulse 68  Temp 97.7 F (36.5 C) (Oral)  Wt 166 lb (75.297 kg)  SpO2 98%chart    Objective:   Physical Exam  Constitutional: He is oriented to person, place, and time. He appears well-developed and well-nourished.  Cardiovascular: Normal rate, regular rhythm and normal heart sounds.   Pulmonary/Chest: Effort normal and breath sounds normal.  Musculoskeletal: Normal range of motion.  Neurological: He is alert and oriented to person, place, and time.  Skin: Skin is warm and dry.  Psychiatric: He has a normal mood and affect.          Assessment & Plan:  Assessment: Insomnia, dementia  Plan: Advised that Ambien is a control medication and not indicated to be used every day. However, he will alternate Tylenol PM with Ambien. Patient call the office symptoms worsen or persist. Recheck a schedule, and when necessary.

## 2011-10-13 NOTE — Patient Instructions (Addendum)

## 2011-10-20 ENCOUNTER — Telehealth (HOSPITAL_COMMUNITY): Payer: Self-pay | Admitting: Dental General Practice

## 2011-10-27 ENCOUNTER — Telehealth: Payer: Self-pay | Admitting: *Deleted

## 2011-10-27 NOTE — Telephone Encounter (Signed)
Pt's wife called stating pt developed "red raised rash on his cheek in area where he received radiation". Rash present x 3-4 days, "is itchy". Pt has applied unknown clear ointment wife states he applied to his neck after he finished radiation. Pt is not on any new meds and has not been outdoors. Pt completed radiation to neck on 08/04/11. Advised wife to make appt w/pt's dermatologist; informed her that typically pt does not experience skin reaction this far out from completion of radiation treatments. She stated she would call Dr Margo Aye today. Informed her will route this note to Dr Mitzi Hansen for his input as well. Wife verbalized understanding, was agreeable to above plan.

## 2011-11-17 ENCOUNTER — Encounter: Payer: Self-pay | Admitting: Radiation Oncology

## 2011-11-19 ENCOUNTER — Ambulatory Visit
Admission: RE | Admit: 2011-11-19 | Discharge: 2011-11-19 | Disposition: A | Discharge status: Home / Self Care | Payer: Medicare Other | Source: Ambulatory Visit | Attending: Radiation Oncology | Admitting: Radiation Oncology

## 2011-11-19 ENCOUNTER — Encounter: Payer: Self-pay | Admitting: Radiation Oncology

## 2011-11-19 VITALS — BP 145/70 | HR 65 | Temp 97.9°F | Resp 20 | Wt 170.2 lb

## 2011-11-19 DIAGNOSIS — R1310 Dysphagia, unspecified: Secondary | ICD-10-CM | POA: Insufficient documentation

## 2011-11-19 DIAGNOSIS — C01 Malignant neoplasm of base of tongue: Secondary | ICD-10-CM | POA: Insufficient documentation

## 2011-11-19 DIAGNOSIS — Z79899 Other long term (current) drug therapy: Secondary | ICD-10-CM | POA: Insufficient documentation

## 2011-11-19 MED ORDER — LARYNGOSCOPY SOLUTION RAD-ONC
15.0000 mL | Freq: Once | TOPICAL | Status: AC
  Administered 2011-11-19: 15 mL via TOPICAL
  Filled 2011-11-19: qty 15

## 2011-11-19 NOTE — Progress Notes (Signed)
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 Patient unable to eat pudding either it comes back up, only using spray in mouth for dryness, fatigued  No c/o pain in throat, no nausea 10:21 AM\

## 2011-11-19 NOTE — Progress Notes (Addendum)
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  He indicates that he continues to experience some dysphasia and odynophagia. His primary complaint is that his food still "goes down the windpipe.". He has been followed by speech/swallowing therapy and he saw them approximately one month ago. He does state that he is continuing with some exercises which they have recommended for him there is however he does not feel like there is been a significant improvement at this time. He continues to use his feeding tube for essentially all of his nutrition. He does use liquid pain medication which she states works well. The patient is also concerned about skin irritation which they felt was possibly a rash in the neck region.                              ALLERGIES:  is allergic to hydromorphone; morphine and related; and codeine.  Meds: Current Outpatient Prescriptions  Medication Sig Dispense Refill  . amLODipine (NORVASC) 10 MG tablet Take 1 tablet (10 mg total) by mouth every morning.  30 tablet  0  . brimonidine (ALPHAGAN) 0.2 % ophthalmic solution Place 1 drop into both eyes 2 (two) times daily.  5 mL    . gabapentin (NEURONTIN) 300 MG capsule Take 600 mg by mouth 2 (two) times daily before lunch and supper.       Marland Kitchen LORazepam (ATIVAN) 0.5 MG tablet Take 0.5 mg by mouth 2 (two) times daily as needed. ANXIETY       . NON FORMULARY 1 tablet by Gastrostomy Tube route at bedtime. Sleep sure tabs over the counter doctor formulated      . Nutritional Supplements (FEEDING SUPPLEMENT, OSMOLITE 1.5 CAL,) LIQD Increase Osmolite 1.5 via feeding tube to 1.5 cans QID with 60  cc water before and after each bolus TF.  Add 240 cc free water TID between feedings as tolerated.  1422 mL  0  . oxymetazoline (AFRIN) 0.05 % nasal spray Place 1 spray into the nose 2 (two) times daily as needed. ALLERGIES       . Travoprost, BAK Free, (TRAVATAMN) 0.004 % SOLN ophthalmic solution Place 1 drop into both eyes at bedtime.      . Multiple Vitamins-Minerals (MULTIVITAMIN WITH MINERALS) tablet Take 1 tablet by mouth daily.       . ondansetron (ZOFRAN) 8 MG tablet Take 1 tab two times a day starting the day after chemo for 3 days. Then take 1 tab two times a day as needed for nausea or vomiting.  30 tablet  1  . prochlorperazine (COMPAZINE) 10 MG tablet Take 1 tablet (10 mg total) by mouth every 6 (six) hours as needed (Nausea or vomiting).  60 tablet  1  . vitamin C (ASCORBIC ACID) 500 MG tablet Take 1,000 mg by mouth daily.       . vitamin E 400 UNIT capsule Take 400 Units by mouth daily.       Marland Kitchen zolpidem (AMBIEN) 5 MG tablet Take 1 tablet (5 mg total) by mouth at bedtime as needed for sleep.  15  tablet  3  . DISCONTD: amLODipine (NORVASC) 10 MG tablet Take 1 tablet (10 mg total) by mouth daily.  30 tablet  0   Current Facility-Administered Medications  Medication Dose Route Frequency Provider Last Rate Last Dose  . laryngocopy solution for Rad-Onc  15 mL Topical Once Jonna Coup, MD   15 mL at 11/19/11 1113   Facility-Administered Medications Ordered in Other Encounters  Medication Dose Route Frequency Provider Last Rate Last Dose  . topical emolient (BIAFINE) emulsion   Topical PRN Jonna Coup, MD        Physical Findings: The patient is in no acute distress. Patient is alert and oriented.  weight is 170 lb 3.2 oz (77.202 kg). His oral temperature is 97.9 F (36.6 C). His blood pressure is 145/70 and his pulse is 65. His respiration is 20. Marland Kitchen     General: Well-developed, in no acute distress HEENT: Normocephalic, atraumatic, No lesions present within the oral  cavity. Some dried secretions present on the tongue Neck: Significant diffuse dryness of skin remains in the neck region. He does have some irritation of his right cheek extending into the preauricular area. This looks like this is gotten infected a little bit with some superficial skin lesions although these appear to be in the process of healing adequately. The patient does regularly scratched this area and I cautioned him not to do this. No palpable lymphadenopathy Cardiovascular: Regular rate and rhythm Respiratory: Clear to auscultation bilaterally GI: Soft, nontender, normal bowel sounds; PEG tube in place Extremities: No edema present  Fiberoptic exam: After the use of topical anesthetic a flexible fiberoptic scope was passed to the right near. Good visualization was obtained of the larynx and oropharynx. No residual mass seen in no suspicious lesions. Pictures were taken.  Lab Findings: Lab Results  Component Value Date   WBC 5.9 08/17/2011   HGB 11.0* 08/17/2011   HCT 32.2* 08/17/2011   MCV 90.8 08/17/2011   PLT 322 08/17/2011     Radiographic Findings: No results found.  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 He is slowly recovering. He really does not feel like his swallowing has substantially improved in the last 3-4 weeks. He is unsure of exactly when his next appointment is with speech/swallowing therapy.  Plan:  Patient has a PET scan coming up in several weeks. He does have followup with swallowing and he is going to check on this. I do believe that he needs to get back in to see them in the next several weeks and he is to let us know if he has any problems with this her needs assistance. I did have a discussion with him about the importance of trying to dances diet and the exercises which he is doing and the importance of following up with them. He is aware of the risk of long term feeding tube dependence.  He will continue with his current pain medication regimen.  I will have him followup with me in 3 months.  I spent 20 minutes with the patient today, the majority of which was spent counseling the patient on the diagnosis of cancer and coordinating care.   Radene Gunning, M.D., Ph.D.

## 2011-11-23 ENCOUNTER — Encounter: Payer: Self-pay | Admitting: Internal Medicine

## 2011-11-23 ENCOUNTER — Ambulatory Visit (INDEPENDENT_AMBULATORY_CARE_PROVIDER_SITE_OTHER): Payer: Medicare Other | Admitting: Internal Medicine

## 2011-11-23 VITALS — BP 140/80 | Temp 98.2°F | Wt 172.0 lb

## 2011-11-23 DIAGNOSIS — M79609 Pain in unspecified limb: Secondary | ICD-10-CM

## 2011-11-23 DIAGNOSIS — M79601 Pain in right arm: Secondary | ICD-10-CM

## 2011-11-23 DIAGNOSIS — C01 Malignant neoplasm of base of tongue: Secondary | ICD-10-CM

## 2011-11-23 DIAGNOSIS — I10 Essential (primary) hypertension: Secondary | ICD-10-CM

## 2011-11-23 MED ORDER — HYDROCODONE-ACETAMINOPHEN 7.5-500 MG/15ML PO SOLN: 15.0000 mL | Freq: Four times a day (QID) | ORAL | Status: AC | PRN

## 2011-11-23 NOTE — Patient Instructions (Signed)
Limit your sodium (Salt) intake    It is important that you exercise regularly, at least 20 minutes 3 to 4 times per week.  If you develop chest pain or shortness of breath seek  medical attention.  Please check your blood pressure on a regular basis.  If it is consistently greater than 150/90, please make an office appointment.  Return in 4 months for follow-up  

## 2011-11-23 NOTE — Progress Notes (Signed)
Subjective:    Patient ID: Jose [name], male    DOB: [birth year], 78 y.o.   MRN: [id number]  HPI  78 year old patient who has a complicated past medical history. Since his last visit here he has been diagnosed with cancer to the base of the time he presented with cervical adenopathy. He is status post RT and chemotherapy in the spring of this year. He is followed closely by oncology and radiation oncology. His chief complaint today is intermittent aching of the right arm for approximately 3 weeks. He does note some occasional tingling of the hand. Pain is much relieved with hydrocodone. Medical regimen also includes Neurontin. He is scheduled for a total body PET scan in 2 weeks. He has treated hypertension which has been stable. Remains totally dependent on Osmolite PEG feedings.  Past Medical History  Diagnosis Date  . Blood transfusion     2006 had 4 units  . Arthritis     knees,back,shoulders  . Anxiety   . Right ureteral stone S/P URETERAL STENT 01-24-11  AND ESWL  03-01-11  . History of acute renal failure 01-23-11    DUE TO HYDRONEPHROSIS AND RIGHT STONE  . Normal cardiac stress test 09-25-2007  . Glaucoma   . Cataract immature   . Diverticulosis of colon   . History of GI diverticular bleed   . Hyperlipemia   . Impaired hearing NOT WEARING HIS BILATERL AIDS  . Urgency of urination     DUE TO URETERAL STONE AND STENT  . Frequency of urination   . Insomnia   . Anemia   . Chronic kidney disease     kidney stones  . GERD (gastroesophageal reflux disease)   . Hernia     inguinal  . Headache     migraines  . Glaucoma   . Hearing impairment     bilateral  . Trigeminal neuralgia   . Hypertension   . Male breast cancer 1988    S/P RIGHT MASTECTOMY W/ NODE DISSECTION AND CHEMO  . 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 Skin cancer 2002    melanoma- back  . History of radiation therapy 06/21/11-08/04/11    tongue/h/n 69.96 gy in 33 fxs  . History of  chemotherapy 07/26/11    taxol/carbo completed/stopped 07/26/11    History   Social History  . Marital Status: Married    Spouse Name: N/A    Number of Children: N/A  . Years of Education: N/A   Occupational History  . Not on file.   Social History Main Topics  . Smoking status: Former Smoker -- 0.2 packs/day for 30 years    Types: Cigars, Cigarettes    Quit date: 12/20/1980  . Smokeless tobacco: Former Neurosurgeon    Quit date: 12/20/1980  . Alcohol Use: No  . Drug Use: No  . Sexually Active: Yes   Other Topics Concern  . Not on file   Social History Narrative  . No narrative on file    Past Surgical History  Procedure Date  . Cystoscopy w/ ureteral stent placement 01/24/2011    Procedure: CYSTOSCOPY WITH RETROGRADE PYELOGRAM/URETERAL STENT PLACEMENT;  Surgeon: Antony Haste, MD;  Location: [facility];  Service: Urology;  Laterality: Right;  . Right leg surg for fx AGE 89    patient states left thigh  . Craniectomy suboccipital for exploration / decompression cranial nerves 2003    5TH CRANIAL NERVE DECOMPRESSION  . Simple mastectomy  1988    MALE--- RIGHT BREAST W/ NODE DISSECTION  . Melanoma excision 10 YRS AGO    BACK AREA  . Extracorporeal shock wave lithotripsy 03-01-11    RIGHT  . Ureteroscopy 03/26/2011    Procedure: URETEROSCOPY;  Surgeon: Antony Haste, MD;  Location: [facility];  Service: Urology;  Laterality: Right;  RIGHT URETEROSCOPY, HOLMIUM LASER AND STENT C-ARM HOLMIUM LASER  . Examination under anesthesia 03/26/2011    Procedure: EXAM UNDER ANESTHESIA;  Surgeon: Antony Haste, MD;  Location: [facility];  Service: Urology;  Laterality: N/A;  . Lymph node biopsy 04/21/2011    Procedure: LYMPH NODE BIOPSY;  Surgeon: Susy Frizzle, MD;  Location: [facility];  Service: ENT;  Laterality: Right;    Family History  Problem Relation Age of Onset  . Heart disease Mother   . Heart disease Father   . Cancer Sister      "perineum"  . Cancer Paternal Grandmother     stomach    Allergies  Allergen Reactions  . Hydromorphone Other (See Comments)    phlebitis  . Morphine And Related Other (See Comments)    phlebitis  . Codeine Other (See Comments)    Dizzy, mental status changes    Current Outpatient Prescriptions on File Prior to Visit  Medication Sig Dispense Refill  . amLODipine (NORVASC) 10 MG tablet Take 1 tablet (10 mg total) by mouth every morning.  30 tablet  0  . brimonidine (ALPHAGAN) 0.2 % ophthalmic solution Place 1 drop into both eyes 2 (two) times daily.  5 mL    . gabapentin (NEURONTIN) 300 MG capsule Take 600 mg by mouth 2 (two) times daily before lunch and supper.       Marland Kitchen LORazepam (ATIVAN) 0.5 MG tablet Take 0.5 mg by mouth 2 (two) times daily as needed. ANXIETY       . Multiple Vitamins-Minerals (MULTIVITAMIN WITH MINERALS) tablet Take 1 tablet by mouth daily.       . NON FORMULARY 1 tablet by Gastrostomy Tube route at bedtime. Sleep sure tabs over the counter doctor formulated      . Nutritional Supplements (FEEDING SUPPLEMENT, OSMOLITE 1.5 CAL,) LIQD Increase Osmolite 1.5 via feeding tube to 1.5 cans QID with 60 cc water before and after each bolus TF.  Add 240 cc free water TID between feedings as tolerated.  1422 mL  0  . ondansetron (ZOFRAN) 8 MG tablet Take 1 tab two times a day starting the day after chemo for 3 days. Then take 1 tab two times a day as needed for nausea or vomiting.  30 tablet  1  . oxymetazoline (AFRIN) 0.05 % nasal spray Place 1 spray into the nose 2 (two) times daily as needed. ALLERGIES       . prochlorperazine (COMPAZINE) 10 MG tablet Take 1 tablet (10 mg total) by mouth every 6 (six) hours as needed (Nausea or vomiting).  60 tablet  1  . Travoprost, BAK Free, (TRAVATAMN) 0.004 % SOLN ophthalmic solution Place 1 drop into both eyes at bedtime.      . vitamin C (ASCORBIC ACID) 500 MG tablet Take 1,000 mg by mouth daily.       . vitamin E 400 UNIT capsule  Take 400 Units by mouth daily.       Marland Kitchen zolpidem (AMBIEN) 5 MG tablet Take 1 tablet (5 mg total) by mouth at bedtime as needed for sleep.  15 tablet  3  .  DISCONTD: amLODipine (NORVASC) 10 MG tablet Take 1 tablet (10 mg total) by mouth daily.  30 tablet  0   Current Facility-Administered Medications on File Prior to Visit  Medication Dose Route Frequency Provider Last Rate Last Dose  . topical emolient (BIAFINE) emulsion   Topical PRN Jonna Coup, MD        BP 140/80  Temp 98.2 F (36.8 C) (Oral)  Wt 172 lb (78.019 kg)      Review of Systems  Constitutional: Positive for fatigue. Negative for fever, chills and appetite change.  HENT: Positive for trouble swallowing and voice change. Negative for hearing loss, ear pain, congestion, sore throat, neck stiffness, dental problem and tinnitus.   Eyes: Negative for pain, discharge and visual disturbance.  Respiratory: Negative for cough, chest tightness, wheezing and stridor.   Cardiovascular: Negative for chest pain, palpitations and leg swelling.  Gastrointestinal: Negative for nausea, vomiting, abdominal pain, diarrhea, constipation, blood in stool and abdominal distention.  Genitourinary: Negative for urgency, hematuria, flank pain, discharge, difficulty urinating and genital sores.  Musculoskeletal: Positive for gait problem. Negative for myalgias, back pain, joint swelling and arthralgias.  Skin: Negative for rash.  Neurological: Positive for weakness. Negative for dizziness, syncope, speech difficulty, numbness and headaches.  Hematological: Negative for adenopathy. Does not bruise/bleed easily.  Psychiatric/Behavioral: Negative for behavioral problems and dysphoric mood. The patient is not nervous/anxious.        Objective:   Physical Exam  Constitutional: He is oriented to person, place, and time. He appears well-developed.  HENT:  Head: Normocephalic.  Right Ear: External ear normal.  Left Ear: External ear normal.    Eyes: Conjunctivae and EOM are normal.  Neck: Normal range of motion.       No cervical adenopathy  Cardiovascular: Normal rate and normal heart sounds.   Pulmonary/Chest: Breath sounds normal.  Abdominal: Bowel sounds are normal.  Musculoskeletal: Normal range of motion. He exhibits no edema and no tenderness.       Normal grip strength Reflexes symmetrical No axillary adenopathy  Neurological: He is alert and oriented to person, place, and time.  Psychiatric: He has a normal mood and affect. His behavior is normal.          Assessment & Plan:   Arm pain unclear etiology. I wonder if this could be related to either chemotherapy or RT. Neurologically he appears to be intact he does have a PET scan ordered in 2 weeks. Pain is isolated with hydrocodone which we will refill. Will continue Neurontin Dyslipidemia Hypertension stable

## 2011-12-06 ENCOUNTER — Encounter (HOSPITAL_COMMUNITY)
Admission: RE | Admit: 2011-12-06 | Discharge: 2011-12-06 | Disposition: A | Discharge status: Home / Self Care | Payer: Medicare Other | Source: Ambulatory Visit | Attending: Oncology | Admitting: Oncology

## 2011-12-06 ENCOUNTER — Encounter (HOSPITAL_COMMUNITY): Payer: Self-pay

## 2011-12-06 ENCOUNTER — Other Ambulatory Visit (HOSPITAL_BASED_OUTPATIENT_CLINIC_OR_DEPARTMENT_OTHER): Payer: Medicare Other

## 2011-12-06 DIAGNOSIS — C801 Malignant (primary) neoplasm, unspecified: Secondary | ICD-10-CM

## 2011-12-06 DIAGNOSIS — C01 Malignant neoplasm of base of tongue: Secondary | ICD-10-CM

## 2011-12-06 LAB — COMPREHENSIVE METABOLIC PANEL (CC13)
Alkaline Phosphatase: 64 U/L (ref 40–150)
BUN: 23 mg/dL (ref 7.0–26.0)
Glucose: 106 mg/dl — ABNORMAL HIGH (ref 70–99)
Total Bilirubin: 0.7 mg/dL (ref 0.20–1.20)

## 2011-12-06 LAB — CBC WITH DIFFERENTIAL/PLATELET
Basophils Absolute: 0.1 10*3/uL (ref 0.0–0.1)
EOS%: 4.1 % (ref 0.0–7.0)
HGB: 12.3 g/dL — ABNORMAL LOW (ref 13.0–17.1)
MCH: 31.8 pg (ref 27.2–33.4)
MCHC: 34.5 g/dL (ref 32.0–36.0)
MCV: 92.2 fL (ref 79.3–98.0)
MONO%: 11.7 % (ref 0.0–14.0)
NEUT%: 69.4 % (ref 39.0–75.0)
RDW: 13.3 % (ref 11.0–14.6)

## 2011-12-06 MED ORDER — FLUDEOXYGLUCOSE F - 18 (FDG) INJECTION
15.2000 | Freq: Once | INTRAVENOUS | Status: AC | PRN
  Administered 2011-12-06: 15.2 via INTRAVENOUS

## 2011-12-07 ENCOUNTER — Encounter: Payer: Self-pay | Admitting: Internal Medicine

## 2011-12-08 ENCOUNTER — Encounter: Payer: Self-pay | Admitting: Oncology

## 2011-12-08 ENCOUNTER — Ambulatory Visit (HOSPITAL_BASED_OUTPATIENT_CLINIC_OR_DEPARTMENT_OTHER): Payer: Medicare Other | Admitting: Oncology

## 2011-12-08 ENCOUNTER — Telehealth: Payer: Self-pay | Admitting: Oncology

## 2011-12-08 VITALS — BP 128/72 | HR 72 | Temp 96.8°F | Resp 20 | Ht 72.0 in | Wt 168.1 lb

## 2011-12-08 DIAGNOSIS — C01 Malignant neoplasm of base of tongue: Secondary | ICD-10-CM

## 2011-12-08 DIAGNOSIS — Z853 Personal history of malignant neoplasm of breast: Secondary | ICD-10-CM

## 2011-12-08 DIAGNOSIS — J984 Other disorders of lung: Secondary | ICD-10-CM | POA: Insufficient documentation

## 2011-12-08 DIAGNOSIS — M79609 Pain in unspecified limb: Secondary | ICD-10-CM

## 2011-12-08 DIAGNOSIS — R911 Solitary pulmonary nodule: Secondary | ICD-10-CM

## 2011-12-08 DIAGNOSIS — E039 Hypothyroidism, unspecified: Secondary | ICD-10-CM

## 2011-12-08 HISTORY — DX: Other disorders of lung: J98.4

## 2011-12-08 NOTE — Patient Instructions (Addendum)
A.  Diagnosis:  History of head/neck cancer. B.  Result of PET scan:  Findings:  Neck: No hypermetabolic lymph nodes in the neck. Resolution of the  bilateral level II hypermetabolic nodes. No asymmetric increased  activity in the glottis. No asymmetric increased activity at the  base the tongue or tonsils.  Chest: There is intense hypermetabolic activity associated with  the reticular fibrotic pattern in the superior left upper lobe  (image 62) and along the left upper mediastinum (image 68). This  likely related to radiation therapy. No hypermetabolic mediastinal  lymph nodes. There is similar fibrotic nodularity in the right  upper lobe without significant metabolic activity.  Abdomen/Pelvis: No abnormal hypermetabolic activity within the  liver, pancreas, adrenal glands, or spleen. No hypermetabolic  lymph nodes in the abdomen or pelvis.  Skeleton: No focal hypermetabolic activity to suggest skeletal  metastasis.   IMPRESSION:   1. Resolution of hypermetabolic cervical adenopathy.  2. Resolution of abnormal metabolic activity in the hypopharynx.  3. New hypermetabolic activity in the left upper lobe associated  with fibrotic nodular thickening is most likely related to  radiation change. Recommend attention on follow-up.   C.  Follow up:  With [place] in about 6 months with CT neck and chest the day prior.  Please also follow up with Radiation Oncology and ENT.

## 2011-12-08 NOTE — Progress Notes (Signed)
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   PAST THERAPY: Definitive concurrent chemoradiation with weekly Carboplatin/Taxol on 06/14/2011; and daily XRT 06/21/11. Chemotherapy completed on 07/26/11 (last dose held due to fatigue, anorexia, and mucositis). Radiation completed on 08/04/11.  CURRENT THERAPY:  Watchful observation.  INTERVAL HISTORY: Jose [name] 78 y.o. male returns for result of PET scan with his wife and daughter.  He reports still having mucositis with the swallow liquids or solids. When he is not swallowing, he has no problem with mucositis pain. He is still 100% dependent on the PEG tube. He is not very compliant with swallowing exercise per his wife's report.  He is active and leaves the house a few times a week. He has mild fatigue however he is independent of all activities of daily living. He has right shoulder pain radiating from the neck all the way down to the fingers.  He is having problem making a fist in the right hand sometimes. He has numbness and tingling in the right fingers. He denies neuropathy in the left arm or his feet. He denies any palpable lymph node swelling.  He still has anorexia and weight loss. He denies any problem the PEG tube such as pain or bleeding or discharge.  Patient denies fever, headache, visual changes, confusion, drenching night sweats, nausea vomiting, jaundice, chest pain, palpitation, shortness of breath, dyspnea on exertion, productive cough, gum bleeding, epistaxis, hematemesis, hemoptysis, abdominal pain, abdominal swelling, early satiety, melena, hematochezia, hematuria, skin rash, spontaneous bleeding, heat or cold intolerance, bowel bladder incontinence, back pain, focal motor weakness,  paresthesia, depression, suicidal or homicidal ideation, feeling hopelessness.   Past Medical History  Diagnosis Date  . Blood transfusion     2006 had 4 units  . Arthritis     knees,back,shoulders  . Anxiety   . Right ureteral stone S/P URETERAL STENT 01-24-11  AND ESWL  03-01-11  . History of acute renal failure 01-23-11    DUE TO HYDRONEPHROSIS AND RIGHT STONE  . Normal cardiac stress test 09-25-2007  . Glaucoma   . Cataract immature   . Diverticulosis of colon   . History of GI diverticular bleed   . Hyperlipemia   . Impaired hearing NOT WEARING HIS BILATERL AIDS  . Urgency of urination     DUE TO URETERAL STONE AND STENT  . Frequency of urination   . Insomnia   . Anemia   . Chronic kidney disease     kidney stones  . GERD (gastroesophageal reflux disease)   . Hernia     inguinal  . Headache     migraines  . Glaucoma   . Hearing impairment     bilateral  . Trigeminal neuralgia   . Hypertension   . Male breast cancer 1988    S/P RIGHT MASTECTOMY W/ NODE DISSECTION AND CHEMO  . 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 Skin cancer 2002    melanoma- back  . History of radiation therapy 06/21/11-08/04/11    tongue/h/n 69.96 gy in 33 fxs  . History of chemotherapy 07/26/11    taxol/carbo completed/stopped 07/26/11  . Apical lung scarring 12/08/2011    Past Surgical History  Procedure Date  . Cystoscopy w/ ureteral  stent placement 01/24/2011    Procedure: CYSTOSCOPY WITH RETROGRADE PYELOGRAM/URETERAL STENT PLACEMENT;  Surgeon: Antony Haste, MD;  Location: [facility];  Service: Urology;  Laterality: Right;  . Right leg surg for fx AGE 33    patient states left thigh  . Craniectomy suboccipital for exploration / decompression cranial nerves 2003    5TH CRANIAL NERVE DECOMPRESSION  . Simple mastectomy 1988    MALE--- RIGHT BREAST W/ NODE DISSECTION  . Melanoma excision 10 YRS AGO    BACK AREA  . Extracorporeal shock wave lithotripsy 03-01-11    RIGHT    . Ureteroscopy 03/26/2011    Procedure: URETEROSCOPY;  Surgeon: Antony Haste, MD;  Location: [facility];  Service: Urology;  Laterality: Right;  RIGHT URETEROSCOPY, HOLMIUM LASER AND STENT C-ARM HOLMIUM LASER  . Examination under anesthesia 03/26/2011    Procedure: EXAM UNDER ANESTHESIA;  Surgeon: Antony Haste, MD;  Location: [facility];  Service: Urology;  Laterality: N/A;  . Lymph node biopsy 04/21/2011    Procedure: LYMPH NODE BIOPSY;  Surgeon: Susy Frizzle, MD;  Location: [facility];  Service: ENT;  Laterality: Right;    Current Outpatient Prescriptions  Medication Sig Dispense Refill  . amLODipine (NORVASC) 10 MG tablet Take 1 tablet (10 mg total) by mouth every morning.  30 tablet  0  . brimonidine (ALPHAGAN) 0.2 % ophthalmic solution Place 1 drop into both eyes 2 (two) times daily.  5 mL    . gabapentin (NEURONTIN) 300 MG capsule Take 600 mg by mouth 2 (two) times daily before lunch and supper.       Marland Kitchen LORazepam (ATIVAN) 0.5 MG tablet Take 0.5 mg by mouth 2 (two) times daily as needed. ANXIETY       . Nutritional Supplements (FEEDING SUPPLEMENT, OSMOLITE 1.5 CAL,) LIQD Increase Osmolite 1.5 via feeding tube to 1.5 cans QID with 60 cc water before and after each bolus TF.  Add 240 cc free water TID between feedings as tolerated.  1422 mL  0  . Travoprost, BAK Free, (TRAVATAMN) 0.004 % SOLN ophthalmic solution Place 1 drop into both eyes at bedtime.      . ondansetron (ZOFRAN) 8 MG tablet Take 1 tab two times a day starting the day after chemo for 3 days. Then take 1 tab two times a day as needed for nausea or vomiting.  30 tablet  1  . prochlorperazine (COMPAZINE) 10 MG tablet Take 1 tablet (10 mg total) by mouth every 6 (six) hours as needed (Nausea or vomiting).  60 tablet  1  . DISCONTD: amLODipine (NORVASC) 10 MG tablet Take 1 tablet (10 mg total) by mouth daily.  30 tablet  0   No current facility-administered medications for this  visit.   Facility-Administered Medications Ordered in Other Visits  Medication Dose Route Frequency Provider Last Rate Last Dose  . topical emolient (BIAFINE) emulsion   Topical PRN Jonna Coup, MD        ALLERGIES:  is allergic to hydromorphone; morphine and related; and codeine.  REVIEW OF SYSTEMS:  The rest of the 14-point review of system was negative.   Filed Vitals:   12/08/11 1405  BP: 128/72  Pulse: 72  Temp: 96.8 F (36 C)  Resp: 20   Wt Readings from Last 3 Encounters:  12/08/11 168 lb 1.6 oz (76.25 kg)  11/23/11 172 lb (78.019 kg)  11/19/11 170 lb 3.2 oz (77.202 kg)   ECOG Performance status: 1-2  PHYSICAL EXAMINATION:   General: Thin, tired appearing man, in no acute distress. Eyes: no scleral icterus. ENT: There was soft palate erythema from treatment without thrush. Neck was without thyromegaly. Lymphatics: Negative cervical, supraclavicular or axillary adenopathy. Skin of his neck was dry but without open wound. Respiratory: lungs were clear bilaterally without wheezing or crackles. Cardiovascular: Regular rate and rhythm, S1/S2, without murmur, rub or gallop. There was no pedal edema. GI: abdomen was soft, flat, nontender, nondistended, without organomegaly. PEG tube dry/clean/intact. Muscoloskeletal: no spinal tenderness of palpation of vertebral spine. He had pain in palpation of right shoulder and was not able to raise up the right arm more than 30 degree.  Skin exam was without echymosis, petichae except for being dry.     LABORATORY/RADIOLOGY DATA:  Lab Results  Component Value Date   WBC 5.5 12/06/2011   HGB 12.3* 12/06/2011   HCT 35.8* 12/06/2011   PLT 279 12/06/2011   GLUCOSE 106* 12/06/2011   CHOL  Value: 217        ATP III CLASSIFICATION:  <200     mg/dL   Desirable  161-096  mg/dL   Borderline High  >=045    mg/dL   High* 06/28/8117   TRIG 150* 09/16/2007   HDL 23* 09/16/2007   LDLCALC  Value: 164        Total Cholesterol/HDL:CHD Risk Coronary Heart  Disease Risk Table                     Men   Women  1/2 Average Risk   3.4   3.3* 09/16/2007   ALKPHOS 64 12/06/2011   ALT 22 12/06/2011   AST 22 12/06/2011   NA 138 12/06/2011   K 4.3 12/06/2011   CL 107 12/06/2011   CREATININE 1.4* 12/06/2011   BUN 23.0 12/06/2011   CO2 24 12/06/2011   INR 0.97 05/17/2011   HGBA1C 6.1* 01/24/2011   IMAGING:  I personally reviewed the following PET scan and showed the patient and his relatives the images.   Nm Pet Image Restag (ps) Skull Base To Thigh  12/06/2011  *RADIOLOGY REPORT*  Clinical Data: Subsequent treatment strategy for lingual carcinoma.  NUCLEAR MEDICINE PET SKULL BASE TO THIGH  Fasting Blood Glucose:  106  Technique:  15.2 mCi F-18 FDG was injected intravenously. CT data was obtained and used for attenuation correction and anatomic localization only.  (This was not acquired as a diagnostic CT examination.) Additional exam technical data entered on technologist worksheet.  Comparison:  CT 05/11/2011  Findings:  Neck: No hypermetabolic lymph nodes in the neck.  Resolution of the bilateral level II hypermetabolic nodes.  No asymmetric increased activity in the glottis.  No asymmetric increased activity at the base the tongue or tonsils.  Chest:  There is intense hypermetabolic activity associated with the reticular fibrotic pattern in the superior left upper lobe (image 62) and along the left upper mediastinum (image 68).  This likely related to radiation therapy.  No hypermetabolic mediastinal lymph nodes.  There is similar fibrotic nodularity in the right upper lobe without significant metabolic activity.  Abdomen/Pelvis:  No abnormal hypermetabolic activity within the liver, pancreas, adrenal glands, or spleen.  No hypermetabolic lymph nodes in the abdomen or pelvis.  Skeleton:  No focal hypermetabolic activity to suggest skeletal metastasis.  IMPRESSION:  1.  Resolution of hypermetabolic cervical adenopathy. 2.  Resolution of abnormal metabolic activity in the  hypopharynx. 3.  New hypermetabolic activity in the left upper  lobe associated with fibrotic nodular thickening is most likely related to radiation change. Recommend attention on follow-up.   Original Report Authenticated By: Genevive Bi, M.D.    ASSESSMENT AND PLAN:   1. HLP: He is on diet control.  2. HTN: He is on Amlodipine.  3. Trigeminal neuralgia: He is on neurontin and tramadol.  4. History of smoking: He quit in 1982.  5. History of kidney stone in 2012 s/p stent.  6. History of male breast cancer in 1989: S/p mastectomy; s/p adjuvant chemoradiation; s/p 5 years of adjuvant Tamoxifen in distant past. He is in remission from this cancer.  7. Chronic Kidney disease: Stable Cr.  8. Right lung nodule uptake: Low suspicion for met or lung cancer at this time. He had bilateral lung apical scaring from mostly radiation. I requested a repeat CT chest in 6 months to ensure stability.  9. 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 He is in remission.  I advised him to follow up with Rad Onc and ENT to ensure follow up endoscopy/laryngoscopy for surveillance.  I will repeat CT neck in about 6 months since he is at high risk of recurrence.  10. Thick phlegm:  He has not been very compliant with diluted H2O2 or Robitussin.  I again went over these measures with him and his relatives.  11. Mild calorie-protein malnutrition: He is still PEG tube dependent.  I advised him to take pain medication before swallowing with the hope to resume oral food intake again.  In the future, when he is able to eat by mouth for at least 2 weeks with stable weight, we will remove PEG tube.  12. Right arm pain:  Most consistent with a nerve root problem.  There was no evidence of cancer.  I will contact Dr. Mitzi Hansen from Rad Onc to see if there was any possibility of radiation causing these symptoms.  I advised him to talk with Dr.  Amador Cunas to see if he can get a neck/shouler MRI if appropriate for work up  14. Follow up: In about 6 months.      The length of time of the face-to-face encounter was 25 minutes. More than 50% of time was spent counseling and coordination of care.

## 2011-12-08 NOTE — Telephone Encounter (Signed)
Gave pt appt for CT for 6 months from now lab and ML March 2014, NPO 4 hours prior to Ct

## 2011-12-09 ENCOUNTER — Other Ambulatory Visit: Payer: Self-pay | Admitting: Internal Medicine

## 2011-12-09 ENCOUNTER — Telehealth: Payer: Self-pay | Admitting: Internal Medicine

## 2011-12-09 DIAGNOSIS — M79603 Pain in arm, unspecified: Secondary | ICD-10-CM

## 2011-12-09 DIAGNOSIS — M25519 Pain in unspecified shoulder: Secondary | ICD-10-CM

## 2011-12-09 NOTE — Telephone Encounter (Signed)
Pt is still having arm pain per wife Dr Gaylyn Rong thinks if maybe pinched nerve requesting referral to neurologist or orthopedic(Dr August Saucer)

## 2011-12-09 NOTE — Telephone Encounter (Signed)
schedule for a cervical MRI

## 2011-12-09 NOTE — Telephone Encounter (Signed)
Please advise - you last saw 02/2011

## 2011-12-09 NOTE — Telephone Encounter (Signed)
Attempt to call sadie - VM - LMTCB informed of what dr. Amador Cunas needs to order first - MRI- before we can refer - please call and let me know if ok to order. KIK

## 2011-12-12 ENCOUNTER — Ambulatory Visit
Admission: RE | Admit: 2011-12-12 | Discharge: 2011-12-12 | Disposition: A | Discharge status: Home / Self Care | Payer: Medicare Other | Source: Ambulatory Visit | Attending: Internal Medicine | Admitting: Internal Medicine

## 2011-12-12 DIAGNOSIS — M79603 Pain in arm, unspecified: Secondary | ICD-10-CM

## 2011-12-12 DIAGNOSIS — M25519 Pain in unspecified shoulder: Secondary | ICD-10-CM

## 2011-12-13 NOTE — Progress Notes (Signed)
Quick Note:  Spoke with wife - informed of resutls - pt not home at this time -  He is still having a lot of pain - what can he do for relief? ______

## 2011-12-14 ENCOUNTER — Encounter: Payer: Self-pay | Admitting: Internal Medicine

## 2011-12-15 NOTE — Addendum Note (Signed)
Encounter addended by: Delynn Flavin, RN on: 12/15/2011  1:42 PM<BR>     Documentation filed: Charges VN

## 2011-12-16 ENCOUNTER — Encounter: Payer: Self-pay | Admitting: General Practice

## 2011-12-16 ENCOUNTER — Telehealth: Payer: Self-pay | Admitting: Internal Medicine

## 2011-12-16 DIAGNOSIS — M79601 Pain in right arm: Secondary | ICD-10-CM

## 2011-12-16 NOTE — Telephone Encounter (Signed)
My mistake -  Please advise --MRI ok - still having pain- would like to know next eval?

## 2011-12-16 NOTE — Telephone Encounter (Signed)
Has this pt's neurology appt been scheduled?

## 2011-12-16 NOTE — Telephone Encounter (Signed)
Spoke with wife - dr. Amador Cunas spoke with pt - he wants this set up - she will be transporting him and a morning appt would be preferred.

## 2011-12-16 NOTE — Telephone Encounter (Signed)
Pt wife reported MRI was normal. Pt is still having arm pain. Pt wife is requesting kim to return her call today.

## 2011-12-16 NOTE — Telephone Encounter (Signed)
There is no referral for Neurology in [facility]

## 2011-12-16 NOTE — Telephone Encounter (Signed)
Please schedule an appointment with interventional radiology to consider cervical spine epidurals;  this was discussed with patient and he is agreeable

## 2011-12-21 ENCOUNTER — Ambulatory Visit
Admission: RE | Admit: 2011-12-21 | Discharge: 2011-12-21 | Disposition: A | Discharge status: Home / Self Care | Payer: Medicare Other | Source: Ambulatory Visit | Attending: Internal Medicine | Admitting: Internal Medicine

## 2011-12-21 DIAGNOSIS — M79601 Pain in right arm: Secondary | ICD-10-CM

## 2011-12-21 MED ORDER — IOHEXOL 300 MG/ML  SOLN
1.0000 mL | Freq: Once | INTRAMUSCULAR | Status: AC | PRN
  Administered 2011-12-21: 1 mL via EPIDURAL

## 2011-12-21 MED ORDER — TRIAMCINOLONE ACETONIDE 40 MG/ML IJ SUSP (RADIOLOGY)
60.0000 mg | Freq: Once | INTRAMUSCULAR | Status: AC
  Administered 2011-12-21: 60 mg via EPIDURAL

## 2012-01-07 ENCOUNTER — Telehealth: Payer: Self-pay | Admitting: Family Medicine

## 2012-01-07 ENCOUNTER — Ambulatory Visit (INDEPENDENT_AMBULATORY_CARE_PROVIDER_SITE_OTHER): Payer: Medicare Other

## 2012-01-07 DIAGNOSIS — R200 Anesthesia of skin: Secondary | ICD-10-CM

## 2012-01-07 DIAGNOSIS — Z23 Encounter for immunization: Secondary | ICD-10-CM

## 2012-01-07 DIAGNOSIS — M79603 Pain in arm, unspecified: Secondary | ICD-10-CM

## 2012-01-07 NOTE — Telephone Encounter (Signed)
Pt was in to see you about 4-6wks ago for his R arm pain. Numbness, and lack of grip. Was referred for injections, which he had done about 4 wks ago. The shots haven't done anything to help, and wife is wondering if a referral to neurology would be a good idea. Please advise. Thank you.

## 2012-01-07 NOTE — Telephone Encounter (Signed)
Spoke with wife and referral request sent 

## 2012-01-07 NOTE — Telephone Encounter (Signed)
Please copy his cervical MRI which I am not able to bring up on my computer

## 2012-01-12 ENCOUNTER — Encounter: Payer: Self-pay | Admitting: Internal Medicine

## 2012-01-12 ENCOUNTER — Ambulatory Visit (INDEPENDENT_AMBULATORY_CARE_PROVIDER_SITE_OTHER): Payer: Medicare Other | Admitting: Internal Medicine

## 2012-01-12 VITALS — BP 138/62 | HR 80 | Ht 71.0 in | Wt 163.1 lb

## 2012-01-12 DIAGNOSIS — R633 Feeding difficulties: Secondary | ICD-10-CM

## 2012-01-12 DIAGNOSIS — B370 Candidal stomatitis: Secondary | ICD-10-CM

## 2012-01-12 DIAGNOSIS — R1013 Epigastric pain: Secondary | ICD-10-CM

## 2012-01-12 DIAGNOSIS — Z86010 Personal history of colonic polyps: Secondary | ICD-10-CM

## 2012-01-12 MED ORDER — AMBULATORY NON FORMULARY MEDICATION: 15.0000 mL | Freq: Every day | Status: DC

## 2012-01-12 NOTE — Patient Instructions (Addendum)
We have sent the following medications to your pharmacy for you to pick up at your convenience:  Omeprazole suspension.  [facility] will call you tomorrow when the medication is ready to be picked up

## 2012-01-12 NOTE — Progress Notes (Signed)
HISTORY OF PRESENT ILLNESS:  Jose [name] is a 78 y.o. male with multiple significant medical problems as listed below. He presents today with his wife regarding surveillance colonoscopy as well as complaints of epigastric burning discomfort. The patient was hospitalized in September of 2006 with GI bleeding. 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 Colonoscopy revealed left-sided diverticulosis with blood in the colon. Normal ileum with bile. His bleeding was felt to be diverticular. A 10 mm polyp was removed from the sigmoid colon and revealed tubulovillous adenoma. He was sent a recall letter a few years ago, but did not respond. As part of the recall project, he was sent a letter a few weeks ago. He set up for this appointment. He is accompanied by his wife. The patient has a history of melanoma and breast cancer. 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 Potential radiology placed a gastrostomy tube in February. Overall he has not been doing well. Complains of inability to swallow, weight loss, and weakness. He is tearful. In addition to swallowing difficulties, he complains of epigastric burning discomfort which occurs most afternoons. He takes Alka-Seltzer via the PEG tube. No nausea or vomiting. No melena or hematochezia. Some constipation,  REVIEW OF SYSTEMS:  All non-GI ROS negative except for fatigue, anxiety, depression, hearing impairment, urinary frequency, headaches, arthritis  Past Medical History  Diagnosis Date  . Blood transfusion     2006 had 4 units  . Arthritis     knees,back,shoulders  . Anxiety   . Right ureteral stone S/P URETERAL STENT 01-24-11  AND ESWL  03-01-11  . History of acute renal failure 01-23-11    DUE TO HYDRONEPHROSIS AND  RIGHT STONE  . Normal cardiac stress test 09-25-2007  . Glaucoma(365)   . Cataract immature   . Diverticulosis of colon   . History of GI diverticular bleed   . Hyperlipemia   . Impaired hearing NOT WEARING HIS BILATERL AIDS  . Urgency of urination     DUE TO URETERAL STONE AND STENT  . Frequency of urination   . Insomnia   . Anemia   . Chronic kidney disease     kidney stones  . GERD (gastroesophageal reflux disease)   . Hernia     inguinal  . Headache     migraines  . Trigeminal neuralgia   . Hypertension   . History of radiation therapy 06/21/11-08/04/11    tongue/h/n 69.96 gy in 33 fxs  . History of chemotherapy 07/26/11    taxol/carbo completed/stopped 07/26/11  . 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 Male breast cancer 1988    S/P RIGHT MASTECTOMY W/ NODE DISSECTION AND CHEMO  . 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 Skin cancer 2002    melanoma- back  . G tube feedings     Past Surgical History  Procedure Date  . Cystoscopy w/ ureteral stent placement 01/24/2011    Procedure: CYSTOSCOPY WITH RETROGRADE PYELOGRAM/URETERAL STENT PLACEMENT;  Surgeon: Antony Haste, MD;  Location: [facility];  Service: Urology;  Laterality: Right;  . Left leg surg for fx AGE 5    patient states left thigh  . Craniectomy suboccipital for exploration / decompression cranial nerves 2003    5TH CRANIAL NERVE DECOMPRESSION  .  Simple mastectomy 1988    MALE--- RIGHT BREAST W/ NODE DISSECTION  . Melanoma excision 10 YRS AGO    BACK AREA  . Extracorporeal shock wave lithotripsy 03-01-11    RIGHT  . Ureteroscopy 03/26/2011    Procedure: URETEROSCOPY;  Surgeon: Antony Haste, MD;  Location: [facility];  Service: Urology;  Laterality: Right;  RIGHT URETEROSCOPY, HOLMIUM LASER AND STENT   . Examination under anesthesia 03/26/2011    Procedure: EXAM UNDER ANESTHESIA;  Surgeon: Antony Haste, MD;  Location: [facility];  Service: Urology;   Laterality: N/A;  . Lymph node biopsy 04/21/2011    Procedure: LYMPH NODE BIOPSY;  Surgeon: Susy Frizzle, MD;  Location: [facility];  Service: ENT;  Laterality: Right;  . Gastrostomy tube placement     Social History [name]  reports that he quit smoking about 31 years ago. His smoking use included Cigarettes and Cigars. He has a 7.5 pack-year smoking history. He does not have any smokeless tobacco history on file. He reports that he does not drink alcohol or use illicit drugs.  family history includes Cancer in his sister; Heart disease in his father and mother; and Stomach cancer in his paternal grandmother.  Allergies  Allergen Reactions  . Hydromorphone Other (See Comments)    phlebitis  . Morphine And Related Other (See Comments)    phlebitis  . Codeine Other (See Comments)    Dizzy, mental status changes       PHYSICAL EXAMINATION: Vital signs: BP 138/62  Pulse 80  Ht 5\' 11"  (1.803 m)  Wt 163 lb 2 oz (73.993 kg)  BMI 22.75 kg/m2 General: frail, thin elderly male, no acute distress HEENT: Sclerae are anicteric, conjunctiva pink. Oral mucosa intact with possible thrush Lungs: Clear Heart: Regular Abdomen: soft, nontender, nondistended, no obvious ascites, no peritoneal signs, normal bowel sounds. No organomegaly. Gastrostomy tube in place. Extremities: trace edema bilaterally Psychiatric: alert and oriented x3. Cooperative    ASSESSMENT:  #1. Advance head and neck cancer status post chemoradiation therapy #2. Dysphagia secondary to therapy #3. Chronic feedings via gastrostomy tube #4. Epigastric discomfort. Rule out ulcer disease #5. Prior history of adenomatous colon polyp. Not an appropriate candidate for surveillance colonoscopy given his other medical issues. Discussed with patient and his wife   PLAN:  #1. Prescribed PPI suspension via the tube daily #2. Continue adequate nutrition via G-tube #3. Mycelex troche for probable thrush prescribed #4.GI  followup as needed

## 2012-01-13 ENCOUNTER — Telehealth: Payer: Self-pay

## 2012-01-13 MED ORDER — CLOTRIMAZOLE 10 MG MT TROC: 10.0000 mg | Freq: Every day | OROMUCOSAL | Status: DC

## 2012-01-13 NOTE — Telephone Encounter (Signed)
Rx sent to pharmacy and wife aware. ?

## 2012-01-13 NOTE — Addendum Note (Signed)
Addended by: Selinda Michaels R on: 01/13/2012 08:24 AM   Modules accepted: Orders

## 2012-01-13 NOTE — Telephone Encounter (Signed)
Message copied by Chrystie Nose on Thu Jan 13, 2012  8:17 AM ------      Message from: Hilarie Fredrickson      Created: Wed Jan 12, 2012  5:29 PM      Regarding: Mycelex troches       Bonita Quin, I saw this patient in the office. He appeared to have oral thrush. I wanted to prescribe him Mycelex Troches but it doesn't appear to have happened. Please prescribe this. Dispense 50. Take one 5 times daily as a lozenger. Please call his wife and let her know. Thanks.

## 2012-02-16 ENCOUNTER — Other Ambulatory Visit: Payer: Self-pay | Admitting: Internal Medicine

## 2012-02-16 MED ORDER — AMBULATORY NON FORMULARY MEDICATION: 15.0000 mL | Freq: Every day | Status: DC

## 2012-02-16 NOTE — Telephone Encounter (Signed)
Left message regarding Omeprazole suspension refill with [facility].  VM said they were closed but accepting refill requests

## 2012-02-23 ENCOUNTER — Encounter: Payer: Self-pay | Admitting: Internal Medicine

## 2012-02-23 ENCOUNTER — Ambulatory Visit (INDEPENDENT_AMBULATORY_CARE_PROVIDER_SITE_OTHER): Payer: Medicare Other | Admitting: Internal Medicine

## 2012-02-23 VITALS — BP 146/70 | HR 80 | Temp 97.6°F | Resp 20 | Wt 162.0 lb

## 2012-02-23 DIAGNOSIS — L608 Other nail disorders: Secondary | ICD-10-CM

## 2012-02-23 DIAGNOSIS — L602 Onychogryphosis: Secondary | ICD-10-CM

## 2012-02-23 DIAGNOSIS — C01 Malignant neoplasm of base of tongue: Secondary | ICD-10-CM

## 2012-02-23 DIAGNOSIS — I10 Essential (primary) hypertension: Secondary | ICD-10-CM

## 2012-02-23 MED ORDER — AMLODIPINE BESYLATE 5 MG PO TABS: 5.0000 mg | ORAL_TABLET | Freq: Every day | ORAL | Status: DC

## 2012-02-23 NOTE — Progress Notes (Signed)
Subjective:    Patient ID: Jose [name], male    DOB: [birth year], 78 y.o.   MRN: [id number]  HPI  78 year old patient who is followed closely by oncology do to head and neck cancer. He has treated hypertension and has been on amlodipine 10 mg daily. He receives this through a PEG tube. He had a unremarkable PET scan about 3 months ago but more recently has noted an enlarged left anterior cervical nodule. He is scheduled for oncology followup soon His weight is been stable over the past 6 weeks. He receives all nourishment through PEG feedings  Wt Readings from Last 3 Encounters:  02/23/12 162 lb (73.483 kg)  01/12/12 163 lb 2 oz (73.993 kg)  12/08/11 168 lb 1.6 oz (76.25 kg)   Past Medical History  Diagnosis Date  . Blood transfusion     2006 had 4 units  . Arthritis     knees,back,shoulders  . Anxiety   . Right ureteral stone S/P URETERAL STENT 01-24-11  AND ESWL  03-01-11  . History of acute renal failure 01-23-11    DUE TO HYDRONEPHROSIS AND RIGHT STONE  . Normal cardiac stress test 09-25-2007  . Glaucoma(365)   . Cataract immature   . Diverticulosis of colon   . History of GI diverticular bleed   . Hyperlipemia   . Impaired hearing NOT WEARING HIS BILATERL AIDS  . Urgency of urination     DUE TO URETERAL STONE AND STENT  . Frequency of urination   . Insomnia   . Anemia   . Chronic kidney disease     kidney stones  . GERD (gastroesophageal reflux disease)   . Hernia     inguinal  . Headache     migraines  . Trigeminal neuralgia   . Hypertension   . History of radiation therapy 06/21/11-08/04/11    tongue/h/n 69.96 gy in 33 fxs  . History of chemotherapy 07/26/11    taxol/carbo completed/stopped 07/26/11  . 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 Male breast cancer 1988    S/P RIGHT MASTECTOMY W/ NODE DISSECTION AND CHEMO  . 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 Skin cancer 2002    melanoma- back  . G tube feedings     History   Social History  .  Marital Status: Married    Spouse Name: N/A    Number of Children: 2  . Years of Education: N/A   Occupational History  . retired    Social History Main Topics  . Smoking status: Former Smoker -- 0.2 packs/day for 30 years    Types: Cigarettes, Cigars    Quit date: 12/20/1980  . Smokeless tobacco: Not on file  . Alcohol Use: No  . Drug Use: No  . Sexually Active: Yes   Other Topics Concern  . Not on file   Social History Narrative  . No narrative on file    Past Surgical History  Procedure Date  . Cystoscopy w/ ureteral stent placement 01/24/2011    Procedure: CYSTOSCOPY WITH RETROGRADE PYELOGRAM/URETERAL STENT PLACEMENT;  Surgeon: Antony Haste, MD;  Location: [facility];  Service: Urology;  Laterality: Right;  . Left leg surg for fx AGE 36    patient states left thigh  . Craniectomy suboccipital for exploration / decompression cranial nerves 2003    5TH CRANIAL NERVE DECOMPRESSION  . Simple mastectomy 1988    MALE--- RIGHT BREAST W/ NODE DISSECTION  . Melanoma excision  10 YRS AGO    BACK AREA  . Extracorporeal shock wave lithotripsy 03-01-11    RIGHT  . Ureteroscopy 03/26/2011    Procedure: URETEROSCOPY;  Surgeon: Antony Haste, MD;  Location: [facility];  Service: Urology;  Laterality: Right;  RIGHT URETEROSCOPY, HOLMIUM LASER AND STENT   . Examination under anesthesia 03/26/2011    Procedure: EXAM UNDER ANESTHESIA;  Surgeon: Antony Haste, MD;  Location: [facility];  Service: Urology;  Laterality: N/A;  . Lymph node biopsy 04/21/2011    Procedure: LYMPH NODE BIOPSY;  Surgeon: Susy Frizzle, MD;  Location: [facility];  Service: ENT;  Laterality: Right;  . Gastrostomy tube placement     Family History  Problem Relation Age of Onset  . Heart disease Mother   . Heart disease Father   . Cancer Sister     "perineum"  . Stomach cancer Paternal Grandmother     Allergies  Allergen Reactions  . Hydromorphone Other (See  Comments)    phlebitis  . Morphine And Related Other (See Comments)    phlebitis  . Codeine Other (See Comments)    Dizzy, mental status changes    Current Outpatient Prescriptions on File Prior to Visit  Medication Sig Dispense Refill  . AMBULATORY NON FORMULARY MEDICATION 15 mLs by Gastric Tube route daily. Omeprazole suspension  450 mL  3  . clotrimazole (MYCELEX) 10 MG troche Take 1 tablet (10 mg total) by mouth 5 (five) times daily.  50 tablet  0  . gabapentin (NEURONTIN) 300 MG capsule Take 600 mg by mouth 2 (two) times daily before lunch and supper.       Marland Kitchen LORazepam (ATIVAN) 0.5 MG tablet Take 0.5 mg by mouth 2 (two) times daily as needed. ANXIETY       . Nutritional Supplements (FEEDING SUPPLEMENT, OSMOLITE 1.5 CAL,) LIQD Increase Osmolite 1.5 via feeding tube to 1.5 cans QID with 60 cc water before and after each bolus TF.  Add 240 cc free water TID between feedings as tolerated.  1422 mL  0  . ondansetron (ZOFRAN) 8 MG tablet Take 1 tab two times a day starting the day after chemo for 3 days. Then take 1 tab two times a day as needed for nausea or vomiting.  30 tablet  1  . prochlorperazine (COMPAZINE) 10 MG tablet Take 1 tablet (10 mg total) by mouth every 6 (six) hours as needed (Nausea or vomiting).  60 tablet  1  . Travoprost, BAK Free, (TRAVATAMN) 0.004 % SOLN ophthalmic solution Place 1 drop into both eyes at bedtime.       Current Facility-Administered Medications on File Prior to Visit  Medication Dose Route Frequency Provider Last Rate Last Dose  . topical emolient (BIAFINE) emulsion   Topical PRN Jonna Coup, MD        BP 146/70  Pulse 80  Temp 97.6 F (36.4 C) (Oral)  Resp 20  Wt 162 lb (73.483 kg)  SpO2 97%   Review of Systems  Constitutional: Positive for activity change, appetite change and fatigue. Negative for fever and chills.  HENT: Positive for sore throat, trouble swallowing and voice change. Negative for hearing loss, ear pain, congestion, neck  stiffness, dental problem and tinnitus.   Eyes: Negative for pain, discharge and visual disturbance.  Respiratory: Negative for cough, chest tightness, wheezing and stridor.   Cardiovascular: Negative for chest pain, palpitations and leg swelling.  Gastrointestinal: Negative for nausea, vomiting, abdominal pain, diarrhea,  constipation, blood in stool and abdominal distention.  Genitourinary: Negative for urgency, hematuria, flank pain, discharge, difficulty urinating and genital sores.  Musculoskeletal: Negative for myalgias, back pain, joint swelling, arthralgias and gait problem.  Skin: Negative for rash.  Neurological: Positive for weakness. Negative for dizziness, syncope, speech difficulty, numbness and headaches.  Hematological: Negative for adenopathy. Does not bruise/bleed easily.  Psychiatric/Behavioral: Negative for behavioral problems and dysphoric mood. The patient is not nervous/anxious.        Objective:   Physical Exam  Constitutional: He is oriented to person, place, and time. He appears well-developed.       Pressure 110/60  HENT:  Head: Normocephalic.  Right Ear: External ear normal.  Left Ear: External ear normal.       Dry oral mucosa  Eyes: Conjunctivae normal and EOM are normal. Scleral icterus: A     Neck: Normal range of motion.       A  left anterior submandibular nodule noted. There appeared to be a smaller nodule also noted on the right in the same vicinity  Cardiovascular: Normal rate and normal heart sounds.   Pulmonary/Chest: Breath sounds normal.  Abdominal: Bowel sounds are normal.       PEG tube noted  Musculoskeletal: Normal range of motion. He exhibits no edema and no tenderness.       No edema  Neurological: He is alert and oriented to person, place, and time.  Psychiatric: He has a normal mood and affect. His behavior is normal.          Assessment & Plan:    Head and neck cancer. Follow up oncology possible new anterior cervical  adenopathy Hypertension. Blood pressure is low normal today we'll decrease amlodipine to 5 mg daily Dyslipidemia  Recheck 6 months

## 2012-02-23 NOTE — Patient Instructions (Addendum)
Podiatry referral as discussed Follow up oncology and radiation oncology  Please check your blood pressure on a regular basis.  If it is consistently greater than 150/90, please make an office appointment.  Return in 6 months for follow-up

## 2012-02-24 ENCOUNTER — Encounter: Payer: Self-pay | Admitting: Radiation Oncology

## 2012-02-24 ENCOUNTER — Ambulatory Visit
Admission: RE | Admit: 2012-02-24 | Discharge: 2012-02-24 | Disposition: A | Discharge status: Home / Self Care | Payer: Medicare Other | Source: Ambulatory Visit | Attending: Radiation Oncology | Admitting: Radiation Oncology

## 2012-02-24 VITALS — BP 108/88 | HR 68 | Temp 98.8°F | Resp 18 | Wt 162.0 lb

## 2012-02-24 DIAGNOSIS — C01 Malignant neoplasm of base of tongue: Secondary | ICD-10-CM

## 2012-02-24 NOTE — Progress Notes (Signed)
Patient presents to the clinic today accompanied by his wife and daughter for a follow up appointment with Dr. Mitzi Hansen. Patient is alert and oriented to person, place, and time. No distress noted. Pleasant affect noted. Patient denies pain at this time. Patient denies nausea. Patient reports that approximately 3 weeks ago he fell at home due to dizziness. Patient's wife is wondering if there is anything she could get to help lift him. Patient reports taking in osmolite four cans per day but, six is recommended. Wife reports in between those four cans she gives him some amounts of ensure and soap via peg tube. Patient denies taking anything by mouth because it chokes him so bad. Dry mouth continues. Weight stable. Patient's wife report that the patient has been very weak for the last few days. Reported all findings to Dr. Mitzi Hansen.

## 2012-02-25 NOTE — Progress Notes (Signed)
Radiation Oncology         [phone]) [phone] ________________________________  Name: Jose [name] MRN: [id number]  Date: 02/24/2012  DOB: 1927/07/02  Follow-Up Visit Note  CC: Rogelia Boga, MD  Serena Colonel, MD  Diagnosis:   Squamous cell carcinoma of the base of tongue  Interval Since Last Radiation:  6 months   Narrative:  The patient returns today for routine follow-up.  The agent complains of some thick secretions within the mouth. Otherwise doing well it appears in the head neck region with respect to specific complaints. He has an area of concern which feels different in the left neck.  The patient's family has a couple of major concerns. One is a request a referral to interventional radiology for reevaluation for possible exchange of his feeding tube. After examining this it looks fine to me on exam and I discussed this with them but they nevertheless want to be evaluated for this. Referral will be made.  The patient's family is most concerned about what appears to be a decrease in his status. From a cancer perspective his scans looked fine, and this included a post treatment PET scan. Nevertheless the patient in general has been, weaker overall and the patient's family is having some difficulty with continued care for him. His weight has trended down but not dramatically so. He has lost approximately 8 pounds over the last 3-4 months. He is taking in all of his nutrition by feeding tube currently. Concerns about aspiration remained and he has been seen by speech/swallowing therapy for management.                              ALLERGIES:  is allergic to hydromorphone; morphine and related; and codeine.  Meds: Current Outpatient Prescriptions  Medication Sig Dispense Refill  . AMBULATORY NON FORMULARY MEDICATION 15 mLs by Gastric Tube route daily. Omeprazole suspension  450 mL  3  . amLODipine (NORVASC) 5 MG tablet Take 1 tablet (5 mg total) by mouth daily.  90 tablet  3  .  gabapentin (NEURONTIN) 300 MG capsule Take 600 mg by mouth 2 (two) times daily before lunch and supper.       Marland Kitchen LORazepam (ATIVAN) 0.5 MG tablet Take 0.5 mg by mouth 2 (two) times daily as needed. ANXIETY       . Nutritional Supplements (FEEDING SUPPLEMENT, OSMOLITE 1.5 CAL,) LIQD Increase Osmolite 1.5 via feeding tube to 1.5 cans QID with 60 cc water before and after each bolus TF.  Add 240 cc free water TID between feedings as tolerated.  1422 mL  0  . Travoprost, BAK Free, (TRAVATAMN) 0.004 % SOLN ophthalmic solution Place 1 drop into both eyes at bedtime.      . clotrimazole (MYCELEX) 10 MG troche Take 1 tablet (10 mg total) by mouth 5 (five) times daily.  50 tablet  0  . ondansetron (ZOFRAN) 8 MG tablet Take 1 tab two times a day starting the day after chemo for 3 days. Then take 1 tab two times a day as needed for nausea or vomiting.  30 tablet  1  . prochlorperazine (COMPAZINE) 10 MG tablet Take 1 tablet (10 mg total) by mouth every 6 (six) hours as needed (Nausea or vomiting).  60 tablet  1   No current facility-administered medications for this encounter.   Facility-Administered Medications Ordered in Other Encounters  Medication Dose Route Frequency Provider Last Rate Last Dose  .  topical emolient (BIAFINE) emulsion   Topical PRN Jonna Coup, MD        Physical Findings: The patient is in no acute distress. Patient is alert and oriented.  weight is 162 lb (73.483 kg). His oral temperature is 98.8 F (37.1 C). His blood pressure is 108/88 and his pulse is 68. His respiration is 18. .   General: Well-developed, in no acute distress HEENT: Normocephalic, atraumatic Oral cavity demonstrates thick secretions. No lesions/ongoing mucositis No palpable cervical lymphadenopathy on my exam today. The patient points to an area which feels different. On my exam this represents the lateral aspect of the hyoid bone. Cardiovascular: Regular rate and rhythm Respiratory: Clear to auscultation  bilaterally; the patient hasn't erythematous rash over the anterior chest. GI: Soft, nontender, normal bowel sounds; feeding tube intact with no sign of infection. Extremities: No edema present Fiberoptic exam today deferred as the patient is seeing Dr. Pollyann Kennedy next week.   Lab Findings: Lab Results  Component Value Date   WBC 5.5 12/06/2011   HGB 12.3* 12/06/2011   HCT 35.8* 12/06/2011   MCV 92.2 12/06/2011   PLT 279 12/06/2011     Radiographic Findings: No results found.  Impression:    The patient clinically is NED at this time. He has ongoing imaging planned but his last scans looked fine. The patient's status however does appear to have been declining. This may be multifactorial in nature as the patient really did have a very difficult treatment which he underwent. Other etiologies are also possible and I discussed this with them. He does have some poor nutrition.  Plan:   The patient is to increase modestly his intake is to a level weight stabilizes his weight.  The patient is to let us know if he has any further development of worrisome findings in the neck region. I did not feel anything worrisome on exam. However, if there is enlargement of a specific area which continues that he will require reevaluation and expressed an understanding of this.  Ongoing followup with medical oncology and ENT.  Referrals have been made to interventional radiology for evaluation of the feeding tube at the patient's request. I've also made a referral to advanced home care.  Patient will followup in 6 months.   Radene Gunning, M.D., Ph.D.

## 2012-03-01 ENCOUNTER — Telehealth (HOSPITAL_COMMUNITY): Payer: Self-pay | Admitting: *Deleted

## 2012-06-06 ENCOUNTER — Other Ambulatory Visit (HOSPITAL_BASED_OUTPATIENT_CLINIC_OR_DEPARTMENT_OTHER): Payer: Medicare Other | Admitting: Lab

## 2012-06-06 ENCOUNTER — Ambulatory Visit (HOSPITAL_COMMUNITY)
Admission: RE | Admit: 2012-06-06 | Discharge: 2012-06-06 | Disposition: A | Discharge status: Home / Self Care | Payer: Medicare Other | Source: Ambulatory Visit | Attending: Oncology | Admitting: Oncology

## 2012-06-06 ENCOUNTER — Other Ambulatory Visit (HOSPITAL_COMMUNITY): Payer: Self-pay

## 2012-06-06 DIAGNOSIS — I2510 Atherosclerotic heart disease of native coronary artery without angina pectoris: Secondary | ICD-10-CM | POA: Insufficient documentation

## 2012-06-06 DIAGNOSIS — I700 Atherosclerosis of aorta: Secondary | ICD-10-CM | POA: Insufficient documentation

## 2012-06-06 DIAGNOSIS — C01 Malignant neoplasm of base of tongue: Secondary | ICD-10-CM

## 2012-06-06 DIAGNOSIS — R911 Solitary pulmonary nodule: Secondary | ICD-10-CM | POA: Insufficient documentation

## 2012-06-06 DIAGNOSIS — Z9010 Acquired absence of unspecified breast and nipple: Secondary | ICD-10-CM | POA: Insufficient documentation

## 2012-06-06 DIAGNOSIS — E039 Hypothyroidism, unspecified: Secondary | ICD-10-CM

## 2012-06-06 DIAGNOSIS — J984 Other disorders of lung: Secondary | ICD-10-CM

## 2012-06-06 DIAGNOSIS — Z85118 Personal history of other malignant neoplasm of bronchus and lung: Secondary | ICD-10-CM

## 2012-06-06 DIAGNOSIS — Z09 Encounter for follow-up examination after completed treatment for conditions other than malignant neoplasm: Secondary | ICD-10-CM | POA: Insufficient documentation

## 2012-06-06 LAB — CBC WITH DIFFERENTIAL/PLATELET
Eosinophils Absolute: 0 10*3/uL (ref 0.0–0.5)
HCT: 40.1 % (ref 38.4–49.9)
HGB: 13.8 g/dL (ref 13.0–17.1)
LYMPH%: 12.2 % — ABNORMAL LOW (ref 14.0–49.0)
MONO#: 0.5 10*3/uL (ref 0.1–0.9)
NEUT#: 4 10*3/uL (ref 1.5–6.5)
NEUT%: 76.6 % — ABNORMAL HIGH (ref 39.0–75.0)
Platelets: 210 10*3/uL (ref 140–400)
WBC: 5.2 10*3/uL (ref 4.0–10.3)

## 2012-06-06 LAB — COMPREHENSIVE METABOLIC PANEL (CC13)
CO2: 29 mEq/L (ref 22–29)
Creatinine: 1.4 mg/dL — ABNORMAL HIGH (ref 0.7–1.3)
Glucose: 148 mg/dl — ABNORMAL HIGH (ref 70–99)
Sodium: 143 mEq/L (ref 136–145)
Total Bilirubin: 0.45 mg/dL (ref 0.20–1.20)
Total Protein: 6.9 g/dL (ref 6.4–8.3)

## 2012-06-06 LAB — TSH: TSH: 3.136 u[IU]/mL (ref 0.350–4.500)

## 2012-06-06 MED ORDER — IOHEXOL 300 MG/ML  SOLN
100.0000 mL | Freq: Once | INTRAMUSCULAR | Status: AC | PRN
  Administered 2012-06-06: 100 mL via INTRAVENOUS

## 2012-06-07 ENCOUNTER — Ambulatory Visit (HOSPITAL_BASED_OUTPATIENT_CLINIC_OR_DEPARTMENT_OTHER): Payer: Medicare Other | Admitting: Oncology

## 2012-06-07 ENCOUNTER — Encounter: Payer: Self-pay | Admitting: *Deleted

## 2012-06-07 ENCOUNTER — Encounter: Payer: Self-pay | Admitting: Oncology

## 2012-06-07 VITALS — BP 134/72 | HR 71 | Temp 97.1°F | Resp 18 | Ht 71.0 in | Wt 170.5 lb

## 2012-06-07 DIAGNOSIS — R911 Solitary pulmonary nodule: Secondary | ICD-10-CM

## 2012-06-07 DIAGNOSIS — Z85810 Personal history of malignant neoplasm of tongue: Secondary | ICD-10-CM

## 2012-06-07 DIAGNOSIS — E441 Mild protein-calorie malnutrition: Secondary | ICD-10-CM

## 2012-06-07 DIAGNOSIS — Z931 Gastrostomy status: Secondary | ICD-10-CM

## 2012-06-07 DIAGNOSIS — C01 Malignant neoplasm of base of tongue: Secondary | ICD-10-CM

## 2012-06-07 NOTE — Progress Notes (Signed)
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   PAST THERAPY: Definitive concurrent chemoradiation with weekly Carboplatin/Taxol on 06/14/2011; and daily XRT 06/21/11. Chemotherapy completed on 07/26/11 (last dose held due to fatigue, anorexia, and mucositis). Radiation completed on 08/04/11.  CURRENT THERAPY:  Watchful observation.  INTERVAL HISTORY: Jose [name] 79 y.o. male returns for routine follow-up with his wife.  He reports still having mucositis with the swallow liquids or solids. When he is not swallowing, he has no problem with mucositis pain. He is still 100% dependent on the PEG tube. He is not very compliant with swallowing exercise per his wife's report.  He is active and leaves the house a few times a week. He has mild fatigue however he is independent of all activities of daily living. He denies any palpable lymph node swelling, although he has generalized edema to his neck.  He denies any problem the PEG tube such as pain or bleeding or discharge.  Patient denies fever, headache, visual changes, confusion, drenching night sweats, nausea vomiting, jaundice, chest pain, palpitation, shortness of breath, dyspnea on exertion, productive cough, gum bleeding, epistaxis, hematemesis, hemoptysis, abdominal pain, abdominal swelling, early satiety, melena, hematochezia, hematuria, skin rash, spontaneous bleeding, heat or cold intolerance, bowel bladder incontinence, back pain, focal motor weakness, paresthesia, depression, suicidal or homicidal ideation, feeling hopelessness.   Past Medical History  Diagnosis Date  . Blood transfusion     2006 had 4 units  . Arthritis     knees,back,shoulders  . Anxiety   . Right ureteral stone S/P URETERAL  STENT 01-24-11  AND ESWL  03-01-11  . History of acute renal failure 01-23-11    DUE TO HYDRONEPHROSIS AND RIGHT STONE  . Normal cardiac stress test 09-25-2007  . Glaucoma(365)   . Cataract immature   . Diverticulosis of colon   . History of GI diverticular bleed   . Hyperlipemia   . Impaired hearing NOT WEARING HIS BILATERL AIDS  . Urgency of urination     DUE TO URETERAL STONE AND STENT  . Frequency of urination   . Insomnia   . Anemia   . Chronic kidney disease     kidney stones  . GERD (gastroesophageal reflux disease)   . Hernia     inguinal  . Headache     migraines  . Trigeminal neuralgia   . Hypertension   . History of radiation therapy 06/21/11-08/04/11    tongue/h/n 69.96 gy in 33 fxs  . History of chemotherapy 07/26/11    taxol/carbo completed/stopped 07/26/11  . 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 Male breast cancer 1988    S/P RIGHT MASTECTOMY W/ NODE DISSECTION AND CHEMO  . 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 Skin cancer 2002    melanoma- back  . G tube feedings   . History of radiation therapy 06/21/11-08/04/11    base of tongue    Past Surgical History  Procedure Laterality Date  . Cystoscopy w/ ureteral stent placement  01/24/2011    Procedure: CYSTOSCOPY WITH RETROGRADE PYELOGRAM/URETERAL STENT PLACEMENT;  Surgeon: Antony Haste, MD;  Location: [facility];  Service: Urology;  Laterality: Right;  . Left leg surg for fx  AGE 79    patient  states left thigh  . Craniectomy suboccipital for exploration / decompression cranial nerves  2003    5TH CRANIAL NERVE DECOMPRESSION  . Simple mastectomy  1988    MALE--- RIGHT BREAST W/ NODE DISSECTION  . Melanoma excision  10 YRS AGO    BACK AREA  . Extracorporeal shock wave lithotripsy  03-01-11    RIGHT  . Ureteroscopy  03/26/2011    Procedure: URETEROSCOPY;  Surgeon: Antony Haste, MD;  Location: [facility];  Service: Urology;  Laterality: Right;  RIGHT URETEROSCOPY, HOLMIUM  LASER AND STENT   . Examination under anesthesia  03/26/2011    Procedure: EXAM UNDER ANESTHESIA;  Surgeon: Antony Haste, MD;  Location: [facility];  Service: Urology;  Laterality: N/A;  . Lymph node biopsy  04/21/2011    Procedure: LYMPH NODE BIOPSY;  Surgeon: Susy Frizzle, MD;  Location: [facility];  Service: ENT;  Laterality: Right;  . Gastrostomy tube placement      Current Outpatient Prescriptions  Medication Sig Dispense Refill  . AMBULATORY NON FORMULARY MEDICATION 15 mLs by Gastric Tube route daily. Omeprazole suspension  450 mL  3  . amLODipine (NORVASC) 5 MG tablet Take 1 tablet (5 mg total) by mouth daily.  90 tablet  3  . gabapentin (NEURONTIN) 300 MG capsule Take 600 mg by mouth 2 (two) times daily before lunch and supper.       Marland Kitchen LORazepam (ATIVAN) 0.5 MG tablet Take 0.5 mg by mouth 2 (two) times daily as needed. ANXIETY       . Nutritional Supplements (FEEDING SUPPLEMENT, OSMOLITE 1.5 CAL,) LIQD Increase Osmolite 1.5 via feeding tube to 1.5 cans QID with 60 cc water before and after each bolus TF.  Add 240 cc free water TID between feedings as tolerated.  1422 mL  0  . Travoprost, BAK Free, (TRAVATAMN) 0.004 % SOLN ophthalmic solution Place 1 drop into both eyes at bedtime.       No current facility-administered medications for this visit.   Facility-Administered Medications Ordered in Other Visits  Medication Dose Route Frequency Provider Last Rate Last Dose  . topical emolient (BIAFINE) emulsion   Topical PRN Jonna Coup, MD        ALLERGIES:  is allergic to hydromorphone; morphine and related; and codeine.  REVIEW OF SYSTEMS:  The rest of the 14-point review of system was negative.   Filed Vitals:   06/07/12 0955  BP: 134/72  Pulse: 71  Temp: 97.1 F (36.2 C)  Resp: 18   Wt Readings from Last 3 Encounters:  06/07/12 170 lb 8 oz (77.338 kg)  02/24/12 162 lb (73.483 kg)  02/23/12 162 lb (73.483 kg)   ECOG Performance status:  1-2  PHYSICAL EXAMINATION:   General: Thin, tired appearing man, in no acute distress. Eyes: no scleral icterus. ENT: There was soft palate erythema from treatment without thrush. Neck was without thyromegaly. Lymphatics: Negative cervical, supraclavicular or axillary adenopathy. Skin of his neck was dry but without open wound. Respiratory: lungs were clear bilaterally without wheezing or crackles. Cardiovascular: Regular rate and rhythm, S1/S2, without murmur, rub or gallop. There was no pedal edema. GI: abdomen was soft, flat, nontender, nondistended, without organomegaly. PEG tube dry/clean/intact. Muscoloskeletal: no spinal tenderness of palpation of vertebral spine. He had pain in palpation of right shoulder and was not able to raise up the right arm more than 30 degree.  Skin exam was without echymosis, petichae except for being dry.  LABORATORY/RADIOLOGY DATA:  Lab Results  Component Value Date   WBC 5.2 06/06/2012   HGB 13.8 06/06/2012   HCT 40.1 06/06/2012   PLT 210 06/06/2012   GLUCOSE 148* 06/06/2012   CHOL  Value: 217        ATP III CLASSIFICATION:  <200     mg/dL   Desirable  829-562  mg/dL   Borderline High  >=130    mg/dL   High* 8/65/7846   TRIG 150* 09/16/2007   HDL 23* 09/16/2007   LDLCALC  Value: 164        Total Cholesterol/HDL:CHD Risk Coronary Heart Disease Risk Table                     Men   Women  1/2 Average Risk   3.4   3.3* 09/16/2007   ALKPHOS 98 06/06/2012   ALT 23 06/06/2012   AST 21 06/06/2012   NA 143 06/06/2012   K 4.2 06/06/2012   CL 106 06/06/2012   CREATININE 1.4* 06/06/2012   BUN 31.8* 06/06/2012   CO2 29 06/06/2012   INR 0.97 05/17/2011   HGBA1C 6.1* 01/24/2011   IMAGING:   *RADIOLOGY REPORT*  Clinical Data: Right-sided cancer of base of tongue. Apical lung  scarring. Hypothyroid.  CT NECK WITH CONTRAST  Technique: Multidetector CT imaging of the neck was performed with  intravenous contrast.  Contrast: OMNIPAQUE IOHEXOL 300 MG/ML SOLN   Comparison: PET scan 12/06/2011. CT neck 04/29/2011.  Findings: Limited imaging of the brain is unremarkable. Radiation  changes are evident throughout the neck. There is circumferential  mucosal thickening at the base of the tongue and throughout the  hypopharynx to the level of the vocal cords. Vocal cords are  symmetric and within normal limits. Slight asymmetric soft tissue  remains along the right tongue base and aryepiglottic fold. No  discrete mass is evident.  The submandibular glands are decreased in size and hyperdense,  compatible with prior radiation. The parotid glands are also more  hyperdense than on the prior exam.  The previously seen adenopathy is no longer evident. Scattered  tiny cervical lymph nodes are present bilaterally.  The focus of necrosis involving the right sternocleidomastoid  muscle has resolved as well.  Minimal fibrosis at the lung apices bilaterally is likely secondary  to radiation change.  Degenerative endplate changes, most evident at C4-5, C5-6, and C6-7  are stable.  IMPRESSION:  1. Near complete resolution of the mass at the tongue base. There  is slight asymmetric soft tissue at the right tongue base and into  the aryepiglottic fold which may simply represent post-treatment  scar. Recommend continued attention to this area.  2. Postradiation changes throughout the neck and the lung apices.  3. No significant residual adenopathy.  4. Stable spondylosis of the mid cervical spine.  Original Report Authenticated By: Marin Roberts, M.D.  *RADIOLOGY REPORT*  Clinical Data: History of head and neck cancer with a neoplasm at  the base of the tongue diagnosed in 2013. Follow-up examination.  CT CHEST WITH CONTRAST  Technique: Multidetector CT imaging of the chest was performed  following the standard protocol during bolus administration of  intravenous contrast.  Contrast: OMNIPAQUE IOHEXOL 300 MG/ML SOLN  Comparison: PET CT  12/06/2011.  Findings:  Mediastinum: Heart size is normal. There is no significant  pericardial fluid, thickening or pericardial calcification. There  is atherosclerosis of the thoracic aorta, the great vessels of the  mediastinum and the coronary  arteries, including calcified  atherosclerotic plaque in the left main coronary arteries. No  pathologically enlarged mediastinal or hilar lymph nodes.  Esophagus is unremarkable in appearance.  Lungs/Pleura: 4 mm right middle lobe nodule (image 37 of series 7).  No other larger more suspicious appearing pulmonary nodules or  masses are otherwise noted. No acute consolidative airspace  disease. No pleural effusions. The previously noted areas of  ground-glass attenuation and spiculation in the lung apices on the  prior study have largely resolved, compatible with resolving  postradiation changes.  Upper Abdomen: Probable 2 mm nonobstructive calculus in the upper  pole collecting system of the right kidney incompletely visualized.  Percutaneous gastrostomy tube.  Musculoskeletal: Postoperative changes of right-sided modified  radical mastectomy and axillary nodal dissection are noted. No  abnormal soft tissue mass along the right chest wall. Healing  fractures of the lateral aspects of the left ninth and tenth ribs.  There are no aggressive appearing lytic or blastic lesions noted in  the visualized portions of the skeleton.  IMPRESSION:  1. No definite signs of metastatic disease to the thorax.  2. There is a tiny 4 mm right middle lobe nodule which is highly  nonspecific (image 37 of series 7). Continued attention on any  future follow up studies is recommended.  3. Resolving postradiation changes in the apices of the lungs  bilaterally.  4. Atherosclerosis, including left main coronary artery disease.  5. Additional incidental findings, as above.  Original Report Authenticated By: Trudie Reed, M.D.   ASSESSMENT AND PLAN:   1.  HLP: He is on diet control.  2. HTN: He is on Amlodipine.  3. Trigeminal neuralgia: He is on neurontin.  4. History of smoking: He quit in 1982.  5. History of kidney stone in 2012 s/p stent.  6. History of male breast cancer in 1989: S/p mastectomy; s/p adjuvant chemoradiation; s/p 5 years of adjuvant Tamoxifen in distant past. He is in remission from this cancer.  7. Chronic Kidney disease: Stable Cr.  8. Right lung nodule uptake: Low suspicion for met or lung cancer at this time. I requested a repeat CT chest in 6 months to ensure stability.  9. 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 He is in remission.  I advised him to follow up with Rad Onc and ENT to ensure follow up endoscopy/laryngoscopy for surveillance.  I will repeat CT neck in about 6 months since he is at high risk of recurrence.  10. Thick phlegm:  He has not been very compliant with diluted H2O2 or Robitussin.  I again went over these measures with him and his relatives.  11. Mild calorie-protein malnutrition: He is still PEG tube dependent.  I advised him to take pain medication before swallowing with the hope to resume oral food intake again.  In the future, when he is able to eat by mouth for at least 2 weeks with stable weight, we will remove PEG tube.  12. Follow up: In about 6 months.      The length of time of the face-to-face encounter was 25 minutes. More than 50% of time was spent counseling and coordination of care.

## 2012-06-07 NOTE — Progress Notes (Signed)
Faxed referral to Lymphedema clinic for Submental edema to [facility] [phone].

## 2012-06-08 ENCOUNTER — Telehealth: Payer: Self-pay | Admitting: Oncology

## 2012-06-08 NOTE — Telephone Encounter (Signed)
s.w. pt and advised on Sept appta

## 2012-06-13 ENCOUNTER — Ambulatory Visit: Payer: Medicare Other | Attending: Oncology | Admitting: Physical Therapy

## 2012-06-13 DIAGNOSIS — M6281 Muscle weakness (generalized): Secondary | ICD-10-CM | POA: Insufficient documentation

## 2012-06-13 DIAGNOSIS — IMO0001 Reserved for inherently not codable concepts without codable children: Secondary | ICD-10-CM | POA: Insufficient documentation

## 2012-06-13 DIAGNOSIS — M2569 Stiffness of other specified joint, not elsewhere classified: Secondary | ICD-10-CM | POA: Insufficient documentation

## 2012-06-13 DIAGNOSIS — I890 Lymphedema, not elsewhere classified: Secondary | ICD-10-CM | POA: Insufficient documentation

## 2012-08-23 ENCOUNTER — Ambulatory Visit
Admission: RE | Admit: 2012-08-23 | Discharge: 2012-08-23 | Disposition: A | Discharge status: Home / Self Care | Payer: Medicare Other | Source: Ambulatory Visit | Attending: Radiation Oncology | Admitting: Radiation Oncology

## 2012-08-23 VITALS — BP 125/72 | HR 68 | Temp 98.1°F | Ht 71.0 in | Wt 171.0 lb

## 2012-08-23 DIAGNOSIS — Z931 Gastrostomy status: Secondary | ICD-10-CM | POA: Insufficient documentation

## 2012-08-23 DIAGNOSIS — R1310 Dysphagia, unspecified: Secondary | ICD-10-CM | POA: Insufficient documentation

## 2012-08-23 DIAGNOSIS — C01 Malignant neoplasm of base of tongue: Secondary | ICD-10-CM

## 2012-08-23 DIAGNOSIS — Z79899 Other long term (current) drug therapy: Secondary | ICD-10-CM | POA: Insufficient documentation

## 2012-08-23 MED ORDER — LARYNGOSCOPY SOLUTION RAD-ONC
15.0000 mL | Freq: Once | TOPICAL | Status: AC
  Administered 2012-08-23: 15 mL via TOPICAL

## 2012-08-23 NOTE — Progress Notes (Signed)
Jose [name] is here for follow up after 69.96 fractions to the base of his tongue.  He denies pain.  He is taking all of his nutrition through his peg tube.  He has pain if he swallows anything other than saliva.  He reports a dry mouth.  He says that he does have hearing loss.  He also reports being dizzy and having fallen 5 times in the last two months.  He was unsteady today stepping off the scale. He does have fatigue.

## 2012-08-24 ENCOUNTER — Ambulatory Visit: Payer: Medicare Other | Admitting: Radiation Oncology

## 2012-08-24 NOTE — Progress Notes (Signed)
Radiation Oncology         [phone]) [phone] ________________________________  Name: Jose [name] MRN: [id number]  Date: 08/23/2012  DOB: [birth year]  Follow-Up Visit Note  CC: Rogelia Boga, MD  Serena Colonel, MD  Diagnosis:   Squamous cell carcinoma of the base of tongue  Interval Since Last Radiation:  One year   Narrative:  The patient returns today for routine follow-up.  The patient feels that he is doing relatively well. Difficulty swallowing continues to be his predominant complaint. He still has a feeding tube in place. He notes that he had some pain with swallowing and also feels that food or liquid "goes down the wrong pipe" when he attempts to swallow. Therefore, he is essentially using his feeding tube exclusively for his nutrition. The patient has been seen by speech/swallowing therapy. Exercises has been recommended for him but the patient states that he really has not continued with them. He feels that the feeding tube is going well although he does note some irritation with the tube itself. No major change in this.  The patient denies any pain in the head neck region. He does complain of some fatigue. He notes some dizziness with standing as well on occasion.                             ALLERGIES:  is allergic to hydromorphone; morphine and related; and codeine.  Meds: Current Outpatient Prescriptions  Medication Sig Dispense Refill  . AMBULATORY NON FORMULARY MEDICATION 15 mLs by Gastric Tube route daily. Omeprazole suspension  450 mL  3  . amLODipine (NORVASC) 5 MG tablet Take 1 tablet (5 mg total) by mouth daily.  90 tablet  3  . gabapentin (NEURONTIN) 300 MG capsule Take 600 mg by mouth 2 (two) times daily before lunch and supper.       Marland Kitchen LORazepam (ATIVAN) 0.5 MG tablet Take 0.5 mg by mouth 2 (two) times daily as needed. ANXIETY       . Nutritional Supplements (FEEDING SUPPLEMENT, OSMOLITE 1.5 CAL,) LIQD Increase Osmolite 1.5 via feeding tube to 1.5 cans QID  with 60 cc water before and after each bolus TF.  Add 240 cc free water TID between feedings as tolerated.  1422 mL  0  . Travoprost, BAK Free, (TRAVATAMN) 0.004 % SOLN ophthalmic solution Place 1 drop into both eyes at bedtime.       No current facility-administered medications for this encounter.   Facility-Administered Medications Ordered in Other Encounters  Medication Dose Route Frequency Provider Last Rate Last Dose  . topical emolient (BIAFINE) emulsion   Topical PRN Jonna Coup, MD        Physical Findings: The patient is in no acute distress. Patient is alert and oriented.  height is 5\' 11"  (1.803 m) and weight is 171 lb (77.565 kg). His temperature is 98.1 F (36.7 C). His blood pressure is 125/72 and his pulse is 68. His oxygen saturation is 97%. .   General: Well-developed, in no acute distress HEENT: Normocephalic, atraumatic; radiation effect is moderate within the neck without palpable lymphadenopathy Cardiovascular: Regular rate and rhythm Respiratory: Clear to auscultation bilaterally GI: Soft, nontender, normal bowel sounds Extremities: No edema present  Fiberoptic exam: After the use of topical anesthetic, the flexible laryngoscope was passed through the right near. Good visualization was obtained. No lesions or suspicious findings within the larynx, hypopharynx, oropharynx or nasopharynx. The epiglottis shows radiation effect with  some asymmetry/fibrosis. No lesions or suspicious findings.  Lab Findings: Lab Results  Component Value Date   WBC 5.2 06/06/2012   HGB 13.8 06/06/2012   HCT 40.1 06/06/2012   MCV 90.3 06/06/2012   PLT 210 06/06/2012     Radiographic Findings: No results found.  Impression:    The patient is clinically stable at this point. He continues to be dependent on his feeding tube. He has been seen by states/swallowing therapy. He does not appear interested in pursuing additional efforts at this time for further evaluation/management of this  issue - not continuing with any exercises currently and did not expressed an interest in further efforts currently. Clinically NED.  Plan:  The patient will be seen in 6 months for ongoing followup.  I spent 15 minutes with the patient today, the majority of which was spent counseling the patient on the diagnosis of cancer and coordinating care.   Radene Gunning, M.D., Ph.D.

## 2012-10-17 ENCOUNTER — Encounter: Payer: Self-pay | Admitting: Internal Medicine

## 2012-10-17 ENCOUNTER — Telehealth: Payer: Self-pay | Admitting: Internal Medicine

## 2012-10-17 NOTE — Telephone Encounter (Signed)
Error

## 2012-10-18 MED ORDER — AMBULATORY NON FORMULARY MEDICATION: 15.0000 mL | Freq: Every day | Status: DC

## 2012-10-18 NOTE — Telephone Encounter (Signed)
Sent refill for omeprazole gastric tube suspension to pharmacy

## 2012-12-08 ENCOUNTER — Encounter (HOSPITAL_COMMUNITY): Payer: Self-pay

## 2012-12-08 ENCOUNTER — Telehealth: Payer: Self-pay | Admitting: *Deleted

## 2012-12-08 ENCOUNTER — Ambulatory Visit (HOSPITAL_COMMUNITY)
Admission: RE | Admit: 2012-12-08 | Discharge: 2012-12-08 | Disposition: A | Discharge status: Home / Self Care | Payer: Medicare Other | Source: Ambulatory Visit | Attending: Oncology | Admitting: Oncology

## 2012-12-08 ENCOUNTER — Other Ambulatory Visit (HOSPITAL_BASED_OUTPATIENT_CLINIC_OR_DEPARTMENT_OTHER): Payer: Medicare Other | Admitting: Lab

## 2012-12-08 DIAGNOSIS — R911 Solitary pulmonary nodule: Secondary | ICD-10-CM | POA: Insufficient documentation

## 2012-12-08 DIAGNOSIS — I2510 Atherosclerotic heart disease of native coronary artery without angina pectoris: Secondary | ICD-10-CM | POA: Insufficient documentation

## 2012-12-08 DIAGNOSIS — Z923 Personal history of irradiation: Secondary | ICD-10-CM | POA: Insufficient documentation

## 2012-12-08 DIAGNOSIS — Z9221 Personal history of antineoplastic chemotherapy: Secondary | ICD-10-CM | POA: Insufficient documentation

## 2012-12-08 DIAGNOSIS — Z931 Gastrostomy status: Secondary | ICD-10-CM | POA: Insufficient documentation

## 2012-12-08 DIAGNOSIS — J3489 Other specified disorders of nose and nasal sinuses: Secondary | ICD-10-CM | POA: Insufficient documentation

## 2012-12-08 DIAGNOSIS — C01 Malignant neoplasm of base of tongue: Secondary | ICD-10-CM

## 2012-12-08 LAB — COMPREHENSIVE METABOLIC PANEL (CC13)
ALT: 16 U/L (ref 0–55)
AST: 18 U/L (ref 5–34)
Albumin: 3.7 g/dL (ref 3.5–5.0)
Alkaline Phosphatase: 92 U/L (ref 40–150)
Calcium: 9.6 mg/dL (ref 8.4–10.4)
Chloride: 106 mEq/L (ref 98–109)
Potassium: 5.4 mEq/L — ABNORMAL HIGH (ref 3.5–5.1)
Sodium: 142 mEq/L (ref 136–145)

## 2012-12-08 LAB — CBC WITH DIFFERENTIAL/PLATELET
BASO%: 1.2 % (ref 0.0–2.0)
EOS%: 0.7 % (ref 0.0–7.0)
HGB: 14.6 g/dL (ref 13.0–17.1)
MCH: 30.8 pg (ref 27.2–33.4)
MCHC: 34 g/dL (ref 32.0–36.0)
RBC: 4.72 10*6/uL (ref 4.20–5.82)
RDW: 13.8 % (ref 11.0–14.6)
lymph#: 0.7 10*3/uL — ABNORMAL LOW (ref 0.9–3.3)

## 2012-12-08 MED ORDER — IOHEXOL 300 MG/ML  SOLN
100.0000 mL | Freq: Once | INTRAMUSCULAR | Status: AC | PRN
  Administered 2012-12-08: 100 mL via INTRAVENOUS

## 2012-12-08 NOTE — Telephone Encounter (Signed)
sw pt wife made her aware that the pt appt was moved from 9/22 to 9/24@ 9am...td

## 2012-12-11 ENCOUNTER — Ambulatory Visit: Payer: Medicare Other

## 2012-12-13 ENCOUNTER — Ambulatory Visit (HOSPITAL_BASED_OUTPATIENT_CLINIC_OR_DEPARTMENT_OTHER): Payer: Medicare Other | Admitting: Hematology and Oncology

## 2012-12-13 ENCOUNTER — Telehealth: Payer: Self-pay | Admitting: Hematology and Oncology

## 2012-12-13 ENCOUNTER — Encounter: Payer: Self-pay | Admitting: Hematology and Oncology

## 2012-12-13 VITALS — BP 158/70 | HR 66 | Temp 97.8°F | Resp 18 | Ht 71.0 in | Wt 166.9 lb

## 2012-12-13 DIAGNOSIS — C01 Malignant neoplasm of base of tongue: Secondary | ICD-10-CM

## 2012-12-13 NOTE — Telephone Encounter (Signed)
gv and printeda ptp sched and avs for pt for March 2015

## 2012-12-13 NOTE — Progress Notes (Signed)
[facility] OFFICE PROGRESS NOTE  Rogelia Boga, MD [address] [place] [address] Chief Complaint  Patient presents with  . 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   SUMMARY OF ONCOLOGIC HISTORY: Definitive concurrent chemoradiation with weekly Carboplatin/Taxol on 06/14/2011; and daily XRT 06/21/11. Chemotherapy completed on 07/26/11 (last dose held due to fatigue, anorexia, and mucositis). Radiation completed on 08/04/11.  INTERVAL HISTORY: Jose [name] 79 y.o. male returns for routine followup, due to prior history of locally invasive tongue cancer. His symptoms remained the same. She still has problems swallowing food. He felt significant difficulties getting food down the right leg, and sometimes it can cause some choking sensation. He is able to swallow saliva. He still has mild persistent dry mouth. He is 100% dependent on the feeding tube. Is not doing any tongue exercises. He has early satiety and occasional nausea. He has lost 4 pounds of weight since he was seen here 6 months ago. He denies any new lymph node swelling or new lesions in his tongue. The patient also has background history of breast cancer who was treated with surgery and tamoxifen. He denies any palpable chest wall mass.  I have reviewed the past medical history, past surgical history, social history and family history with the patient and they are unchanged from previous note.  ALLERGIES:  is allergic to hydromorphone; morphine and related; and codeine.  MEDICATIONS: has a current medication list which includes the following prescription(s): AMBULATORY NON FORMULARY MEDICATION, gabapentin, lorazepam, feeding supplement (osmolite 1.5 cal), travoprost (bak free), and amlodipine, and the following Facility-Administered Medications: topical  emolient.  REVIEW OF SYSTEMS:   Constitutional: Denies fevers, chills  Eyes: Denies blurriness of vision Ears, nose, mouth, throat, and face: He has mild, or persistent sore throat Respiratory: Denies cough, dyspnea or wheezes Cardiovascular: Denies palpitation, chest discomfort or lower extremity swelling Gastrointestinal:  Denies nausea, heartburn or change in bowel habits Skin: Denies abnormal skin rashes Lymphatics: Denies new lymphadenopathy or easy bruising Neurological:Denies numbness, tingling or new weaknesses Behavioral/Psych: Mood is stable, no new changes  All other systems were reviewed with the patient and are negative.  PHYSICAL EXAMINATION: ECOG PERFORMANCE STATUS: 1 - Symptomatic but completely ambulatory  Filed Vitals:   12/13/12 0903  BP: 158/70  Pulse: 66  Temp: 97.8 F (36.6 C)  Resp: 18    GENERAL:alert, no distress and comfortable SKIN: skin color, texture, turgor are normal, no rashes or significant lesions EYES: normal, Conjunctiva are pink and non-injected, sclera clear OROPHARYNX:no exudate, no erythema and lips, buccal mucosa, and tongue normal  NECK: supple, thyroid normal size, non-tender, without nodularity LYMPH:  no palpable lymphadenopathy in the cervical, axillary or inguinal LUNGS: clear to auscultation and percussion with normal breathing effort HEART: regular rate & rhythm and no murmurs and no lower extremity edema ABDOMEN:abdomen soft, non-tender and normal bowel sounds the feeding tube looks okay Musculoskeletal:no cyanosis of digits and no clubbing  NEURO: alert & oriented x 3 with fluent speech, no focal motor/sensory deficits Chest wall examination did not reveal any mass. Well-healed mastectomy scar on the right  LABORATORY DATA:  I have reviewed the data as listed    Component Value Date/Time   NA 142 12/08/2012 0921   NA 138 08/17/2011 1328   K 5.4* 12/08/2012 0921   K 4.6 08/17/2011 1328   CL 106 06/06/2012  1041   CL 103  08/17/2011 1328   CO2 28 12/08/2012 0921   CO2 29 08/17/2011 1328   GLUCOSE 112 12/08/2012 0921   GLUCOSE 148* 06/06/2012 1041   GLUCOSE 187* 08/17/2011 1328   BUN 27.9* 12/08/2012 0921   BUN 31* 08/17/2011 1328   CREATININE 1.4* 12/08/2012 0921   CREATININE 1.40* 08/17/2011 1328   CALCIUM 9.6 12/08/2012 0921   CALCIUM 8.5 08/17/2011 1328   PROT 7.2 12/08/2012 0921   PROT 5.9* 08/17/2011 1328   ALBUMIN 3.7 12/08/2012 0921   ALBUMIN 3.4* 08/17/2011 1328   AST 18 12/08/2012 0921   AST 20 08/17/2011 1328   ALT 16 12/08/2012 0921   ALT 21 08/17/2011 1328   ALKPHOS 92 12/08/2012 0921   ALKPHOS 78 08/17/2011 1328   BILITOT 0.72 12/08/2012 0921   BILITOT 0.2* 08/17/2011 1328   GFRNONAA 42* 05/17/2011 0915   GFRAA 48* 05/17/2011 0915    I No results found for this basename: SPEP, UPEP,  kappa and lambda light chains    Lab Results  Component Value Date   WBC 4.9 12/08/2012   NEUTROABS 3.6 12/08/2012   HGB 14.6 12/08/2012   HCT 42.9 12/08/2012   MCV 90.8 12/08/2012   PLT 209 12/08/2012      Chemistry      Component Value Date/Time   NA 142 12/08/2012 0921   NA 138 08/17/2011 1328   K 5.4* 12/08/2012 0921   K 4.6 08/17/2011 1328   CL 106 06/06/2012 1041   CL 103 08/17/2011 1328   CO2 28 12/08/2012 0921   CO2 29 08/17/2011 1328   BUN 27.9* 12/08/2012 0921   BUN 31* 08/17/2011 1328   CREATININE 1.4* 12/08/2012 0921   CREATININE 1.40* 08/17/2011 1328      Component Value Date/Time   CALCIUM 9.6 12/08/2012 0921   CALCIUM 8.5 08/17/2011 1328   ALKPHOS 92 12/08/2012 0921   ALKPHOS 78 08/17/2011 1328   AST 18 12/08/2012 0921   AST 20 08/17/2011 1328   ALT 16 12/08/2012 0921   ALT 21 08/17/2011 1328   BILITOT 0.72 12/08/2012 0921   BILITOT 0.2* 08/17/2011 1328     RADIOGRAPHIC STUDIES: I have personally reviewed the radiological images as listed and agreed with the findings in the report. The imaging study of his neck and chest were reviewed with the patient and his wife we show no evidence of disease  recurrence  ASSESSMENT: Tongue cancer, no evidence of active disease, with persistent dysphagia   PLAN:  #1 tongue cancer He has no evidence of recurrence of disease. He just saw the ear, nose and throat specialist with assessment with laryngoscopy which was negative. We will continue history, physical examination, an imaging study every 6 months for now. #2 malnutrition and dysphagia The patient stated he cannot swallow fluid. He is not doing the exercises and refused to see speech and language therapist. The cause of his swallowing difficulties was due to esophageal stricture. I am wondering whether he would benefit from an esophageal dilation. Obviously the dysphagia was due to esophageal stricture. I will present his concerned to his surgeon for further discussion in the near future #3 history of breast cancer There is no evidence of disease recurrence. I will focus only on the chest wall examination in the future  All questions were answered. The patient knows to call the clinic with any problems, questions or concerns. We can certainly see the patient much sooner if necessary. No barriers to learning was  detected. I spent 40 minutes counseling the patient face to face. The total time spent in the appointment was 60 minutes and more than 50% was on counseling and review of results     [facility], Addis Bennie, MD 12/13/2012 9:42 AM

## 2013-01-08 ENCOUNTER — Ambulatory Visit: Payer: Medicare Other | Admitting: Internal Medicine

## 2013-01-08 ENCOUNTER — Telehealth: Payer: Self-pay | Admitting: Internal Medicine

## 2013-01-08 MED ORDER — OMEPRAZOLE 2 MG/ML PO SUSP: 15.0000 mL | Freq: Every day | ORAL | Status: DC

## 2013-01-08 NOTE — Telephone Encounter (Signed)
Sent in Rx for Omeprazole 15 Ml via gastric tube.  to [place] [phone] to [facility]

## 2013-01-18 ENCOUNTER — Other Ambulatory Visit: Payer: Self-pay | Admitting: Otolaryngology

## 2013-01-18 DIAGNOSIS — R1310 Dysphagia, unspecified: Secondary | ICD-10-CM

## 2013-01-19 ENCOUNTER — Telehealth: Payer: Self-pay | Admitting: Dietician

## 2013-01-24 ENCOUNTER — Ambulatory Visit
Admission: RE | Admit: 2013-01-24 | Discharge: 2013-01-24 | Disposition: A | Discharge status: Home / Self Care | Payer: Medicare Other | Source: Ambulatory Visit | Attending: Otolaryngology | Admitting: Otolaryngology

## 2013-01-24 DIAGNOSIS — R1310 Dysphagia, unspecified: Secondary | ICD-10-CM

## 2013-01-30 ENCOUNTER — Other Ambulatory Visit (INDEPENDENT_AMBULATORY_CARE_PROVIDER_SITE_OTHER): Payer: Medicare Other

## 2013-01-30 ENCOUNTER — Ambulatory Visit (INDEPENDENT_AMBULATORY_CARE_PROVIDER_SITE_OTHER): Payer: Medicare Other | Admitting: Internal Medicine

## 2013-01-30 ENCOUNTER — Encounter: Payer: Self-pay | Admitting: Internal Medicine

## 2013-01-30 ENCOUNTER — Other Ambulatory Visit: Payer: Self-pay

## 2013-01-30 VITALS — BP 110/68 | HR 68 | Ht 71.0 in | Wt 161.4 lb

## 2013-01-30 DIAGNOSIS — R110 Nausea: Secondary | ICD-10-CM

## 2013-01-30 DIAGNOSIS — R1013 Epigastric pain: Secondary | ICD-10-CM

## 2013-01-30 DIAGNOSIS — C029 Malignant neoplasm of tongue, unspecified: Secondary | ICD-10-CM

## 2013-01-30 DIAGNOSIS — R1314 Dysphagia, pharyngoesophageal phase: Secondary | ICD-10-CM

## 2013-01-30 LAB — BASIC METABOLIC PANEL
BUN: 29 mg/dL — ABNORMAL HIGH (ref 6–23)
Creatinine, Ser: 1.3 mg/dL (ref 0.4–1.5)
GFR: 56.97 mL/min — ABNORMAL LOW (ref >60.00)
Glucose, Bld: 105 mg/dL — ABNORMAL HIGH (ref 70–99)

## 2013-01-30 MED ORDER — OMEPRAZOLE 40 MG PO CPDR: DELAYED_RELEASE_CAPSULE | ORAL | Status: DC

## 2013-01-30 MED ORDER — RABEPRAZOLE SODIUM 10 MG PO CPSP: 1.0000 | ORAL_CAPSULE | Freq: Two times a day (BID) | ORAL | Status: DC

## 2013-01-30 NOTE — Progress Notes (Signed)
Subjective:    Patient ID: Jose [name], male    DOB: 03-18-1928, 79 y.o.   MRN: [id number]  HPI  Jose [name] is a very nice 79 year old white male known to Dr. Marina Goodell with history of a squamous cell cancer of the base of the tongue with extension into the epiglottis he completed chemotherapy and radiation in 2013. He has had persistent problems with dysphagia and is dependent on PEG feedings. He apparently has recently seen Dr. Alvia Grove who is considering doing a dilation on his pharynx to see if he will be able to have improved swallowing function. He had recent barium swallow done 01/18/2013 which showed laryngeal aspiration and penetration with thin and thick barium and no barium entering the esophagus. Patient comes in here today with complaints of intermittent burning epigastric pain which she says is been present over the past 6 months he says he had been having episodes about every 48 hours but is now having the much more frequently and is usually in the evening hours. He describes it as a hard burning cramping type pain in his upper abdomen that lasts for a few minutes and is associated with nausea and sometimes dry heaves. He does not have any radiation into his back. He is had no fever or chills no changes in his bowel habits no melena or hematochezia. His wife states he's been tolerating his Osmolite to feedings without difficulty and they do not feel that tube feedings exacerbate the pain. He has been on omeprazole syrup 10 mg once daily over the past couple of months. He is not sure whether this helps or not. Is not using any aspirin/ NSAIDs and is not on any blood thinners. He has not had any recent abdominal imaging    Review of Systems  Constitutional: Positive for unexpected weight change.  HENT: Positive for trouble swallowing.   Eyes: Negative.   Respiratory: Negative.   Cardiovascular: Negative.   Gastrointestinal: Positive for nausea and abdominal pain.  Endocrine: Negative.    Genitourinary: Negative.   Musculoskeletal: Negative.   Allergic/Immunologic: Negative.   Neurological: Negative.   Hematological: Negative.   Psychiatric/Behavioral: Negative.    Outpatient Prescriptions Prior to Visit  Medication Sig Dispense Refill  . amLODipine (NORVASC) 5 MG tablet Take 1 tablet (5 mg total) by mouth daily.  90 tablet  3  . LORazepam (ATIVAN) 0.5 MG tablet Take 0.5 mg by mouth 2 (two) times daily as needed. ANXIETY       . Nutritional Supplements (FEEDING SUPPLEMENT, OSMOLITE 1.5 CAL,) LIQD Increase Osmolite 1.5 via feeding tube to 1.5 cans QID with 60 cc water before and after each bolus TF.  Add 240 cc free water TID between feedings as tolerated.  1422 mL  0  . Omeprazole 2 MG/ML SUSP Take 15 mLs by mouth daily. 15 ml via gastric tube daily  300 mL  3  . Travoprost, BAK Free, (TRAVATAMN) 0.004 % SOLN ophthalmic solution Place 1 drop into both eyes at bedtime.      . gabapentin (NEURONTIN) 300 MG capsule Take 600 mg by mouth 2 (two) times daily before lunch and supper.        Facility-Administered Medications Prior to Visit  Medication Dose Route Frequency Provider Last Rate Last Dose  . topical emolient (BIAFINE) emulsion   Topical PRN Jonna Coup, MD       Allergies  Allergen Reactions  . Hydromorphone Other (See Comments)    phlebitis  . Morphine And Related Other (See  Comments)    phlebitis  . Codeine Other (See Comments)    Dizzy, mental status changes   Patient Active Problem List   Diagnosis Date Noted  . 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 Male breast cancer   . Cancer of base of tongue 04/21/2011  . Nephrolithiasis 01/24/2011  . BPH (benign prostatic hyperplasia) 01/24/2011  . ARF (acute renal failure) 01/24/2011  . ANXIETY STATE, UNSPECIFIED 11/19/2009  . TRIGEMINAL NEURALGIA 10/12/2007  . HYPERCHOLESTEROLEMIA 11/08/2006  . HYPERLIPIDEMIA 11/08/2006  . DIVERTICULOSIS, COLON 11/08/2006  . RUQ PAIN 11/08/2006  . COLONIC POLYPS, HX OF  11/08/2006  . HYPERTENSION 10/13/2006   History  Substance Use Topics  . Smoking status: Former Smoker -- 0.25 packs/day for 30 years    Types: Cigarettes, Cigars    Quit date: 12/20/1980  . Smokeless tobacco: Never Used  . Alcohol Use: No   family history includes Cancer in his sister; Heart disease in his father and mother; Stomach cancer in his paternal grandmother.     Objective:   Physical Exam  well-developed thin elderly white male in no acute distress, accompanied by his wife. Blood pressure 110/68 pulse 68 height 5 foot 11 weight 161. HEENT; nontraumatic normocephalic EOMI PER LA sclera are anicteric, Supple;, he does have radiation changes on the neck, Cardiovascular; regular rate and rhythm with S1-S2 no murmur rub or gallop, Pulmonary; clear bilaterally, Abdomen; soft basically nontender there is no palpable mass or hepatosplenomegaly he does have a PEG tube in place which is benign, Bowel sounds are present, Rectal ; not done, Extremities; no clubbing cyanosis or edema skin warm and dry, Psych; mood and affect appropriate        Assessment & Plan:  #33  79 year old male with history of T4 N2 MO right base tongue squamous cell cancer with extension to the epiglottis. He completed treatment with chemotherapy and radiation in 2013 but has had persistent severe dysphagia and his PEG tube dependent. #2 six-month history of intermittent intense burning upper abdominal  pain associated with nausea. Etiology not clear . Rule out possible gallbladder disease, peptic ulcer disease PEG tube-induced ulceration, or underlying malignancy  Plan; schedule for CT scan of the abdomen and pelvis with contrast via the PEG tube Will switch omeprazole syrup to AcipHex sprinkles 10 mg twice daily Further plans pending results of CT.

## 2013-01-30 NOTE — Patient Instructions (Addendum)
Your physician has requested that you go to the basement for the following lab work before leaving today:  BMET  We have sent the following medications to your pharmacy for you to pick up at your convenience: Aciphex sprinkle  You have been scheduled for a CT scan of the abdomen and pelvis at [facility] ([address]---this is in the same building as [facility]).   You are scheduled on 02/02/2013 at 9:00am. You should arrive 15 minutes prior to your appointment time for registration. Please follow the written instructions below on the day of your exam:  WARNING: IF YOU ARE ALLERGIC TO IODINE/X-RAY DYE, PLEASE NOTIFY RADIOLOGY IMMEDIATELY AT [phone]! YOU WILL BE GIVEN A 13 HOUR PREMEDICATION PREP.  1) Do not eat or drink anything after 5:00am (4 hours prior to your test) 2) You have been given 2 bottles of oral contrast to drink. The solution may taste better if refrigerated, but do NOT add ice or any other liquid to this solution. Shake well before drinking.    Drink 1 bottle of contrast @  7:00am(2 hours prior to your exam)  Drink 1 bottle of contrast @ 8:00am (1 hour prior to your exam)  You may take any medications as prescribed with a small amount of water except for the following: Metformin, Glucophage, Glucovance, Avandamet, Riomet, Fortamet, Actoplus Met, Janumet, Glumetza or Metaglip. The above medications must be held the day of the exam AND 48 hours after the exam.  The purpose of you drinking the oral contrast is to aid in the visualization of your intestinal tract. The contrast solution may cause some diarrhea. Before your exam is started, you will be given a small amount of fluid to drink. Depending on your individual set of symptoms, you may also receive an intravenous injection of x-ray contrast/dye. Plan on being at [facility] for 30 minutes or long, depending on the type of exam you are having performed.  If you have any questions regarding  your exam or if you need to reschedule, you may call the CT department at [phone] between the hours of 8:00 am and 5:00 pm, Monday-Friday.  ________________________________________________________________________

## 2013-02-02 ENCOUNTER — Ambulatory Visit (INDEPENDENT_AMBULATORY_CARE_PROVIDER_SITE_OTHER)
Admission: RE | Admit: 2013-02-02 | Discharge: 2013-02-02 | Disposition: A | Discharge status: Home / Self Care | Payer: Medicare Other | Source: Ambulatory Visit | Attending: Physician Assistant | Admitting: Physician Assistant

## 2013-02-02 DIAGNOSIS — R110 Nausea: Secondary | ICD-10-CM

## 2013-02-02 DIAGNOSIS — R1013 Epigastric pain: Secondary | ICD-10-CM

## 2013-02-02 DIAGNOSIS — C029 Malignant neoplasm of tongue, unspecified: Secondary | ICD-10-CM

## 2013-02-02 MED ORDER — IOHEXOL 300 MG/ML  SOLN
100.0000 mL | Freq: Once | INTRAMUSCULAR | Status: AC | PRN
  Administered 2013-02-02: 100 mL via INTRAVENOUS

## 2013-02-06 ENCOUNTER — Encounter (HOSPITAL_COMMUNITY): Payer: Self-pay | Admitting: Pharmacy Technician

## 2013-02-08 ENCOUNTER — Encounter (HOSPITAL_COMMUNITY)
Admission: RE | Admit: 2013-02-08 | Discharge: 2013-02-08 | Disposition: A | Discharge status: Home / Self Care | Payer: Medicare Other | Source: Ambulatory Visit | Attending: Anesthesiology | Admitting: Anesthesiology

## 2013-02-08 ENCOUNTER — Encounter (HOSPITAL_COMMUNITY)
Admission: RE | Admit: 2013-02-08 | Discharge: 2013-02-08 | Disposition: A | Discharge status: Home / Self Care | Payer: Medicare Other | Source: Ambulatory Visit | Attending: Otolaryngology | Admitting: Otolaryngology

## 2013-02-08 ENCOUNTER — Encounter (HOSPITAL_COMMUNITY): Payer: Self-pay

## 2013-02-08 DIAGNOSIS — Z01818 Encounter for other preprocedural examination: Secondary | ICD-10-CM | POA: Insufficient documentation

## 2013-02-08 DIAGNOSIS — Z01810 Encounter for preprocedural cardiovascular examination: Secondary | ICD-10-CM | POA: Insufficient documentation

## 2013-02-08 DIAGNOSIS — Z01812 Encounter for preprocedural laboratory examination: Secondary | ICD-10-CM | POA: Insufficient documentation

## 2013-02-08 LAB — BASIC METABOLIC PANEL
BUN: 27 mg/dL — ABNORMAL HIGH (ref 6–23)
CO2: 29 mEq/L (ref 19–32)
Creatinine, Ser: 1.27 mg/dL (ref 0.50–1.35)
GFR calc Af Amer: 59 mL/min — ABNORMAL LOW (ref >90)
GFR calc non Af Amer: 50 mL/min — ABNORMAL LOW (ref >90)
Potassium: 4.1 mEq/L (ref 3.5–5.1)

## 2013-02-08 LAB — CBC
HCT: 40 % (ref 39.0–52.0)
Hemoglobin: 13.9 g/dL (ref 13.0–17.0)
MCH: 31.7 pg (ref 26.0–34.0)
MCHC: 34.8 g/dL (ref 30.0–36.0)
MCV: 91.3 fL (ref 78.0–100.0)
RBC: 4.38 MIL/uL (ref 4.22–5.81)
RDW: 12.9 % (ref 11.5–15.5)

## 2013-02-08 NOTE — Progress Notes (Signed)
Discussed with allison zelenak,PA that pt. Should hold am medicines since they are administered through peg tube.Pt. Can resume them when he returns home. Wife and pt. Are aware to stop peg tube  Feeding by midnight 11/25.

## 2013-02-08 NOTE — Pre-Procedure Instructions (Signed)
Jose [name]  02/08/2013   Your procedure is scheduled on:  Wednesday, November 26  Report to [facility] Main Entrance "A" at 6:30 AM.  Call this number if you have problems the morning of surgery: [phone]   Remember:   Do not eat food or drink liquids after midnight.   Take these medicines the morning of surgery with A SIP OF WATER: none   Do not wear jewelry, make-up or nail polish.  Do not wear lotions, powders, or perfumes. You may wear deodorant.  Do not shave 48 hours prior to surgery. Men may shave face and neck.  Do not bring valuables to the hospital.  [facility] is not responsible   for any belongings or valuables.               Contacts, dentures or bridgework may not be worn into surgery.  Leave suitcase in the car. After surgery it may be brought to your room.  For patients admitted to the hospital, discharge time is determined by your                treatment team.               Patients discharged the day of surgery will not be allowed to drive  home.  Name and phone number of your driver:   Special Instructions: Shower using CHG 2 nights before surgery and the night before surgery.  If you shower the day of surgery use CHG.  Use special wash - you have one bottle of CHG for all showers.  You should use approximately 1/3 of the bottle for each shower.   Please read over the following fact sheets that you were given: Pain Booklet, Coughing and Deep Breathing and Surgical Site Infection Prevention

## 2013-02-08 NOTE — Progress Notes (Signed)
02/08/13 1320  OBSTRUCTIVE SLEEP APNEA  Have you ever been diagnosed with sleep apnea through a sleep study? No  Do you snore loudly (loud enough to be heard through closed doors)?  0  Do you often feel tired, fatigued, or sleepy during the daytime? 1  Has anyone observed you stop breathing during your sleep? 0  Do you have, or are you being treated for high blood pressure? 1  BMI more than 35 kg/m2? 0  Age over 77 years old? 1  Neck circumference greater than 40 cm/18 inches? 0  Gender: 1  Obstructive Sleep Apnea Score 4  Score 4 or greater  Results sent to PCP

## 2013-02-08 NOTE — H&P (Signed)
Assessment  Dry mouth (527.7) (R68.2). Difficulty swallowing (787.20) (R13.10). Orders  Barium Swallow; Requested for: 16 Jan 2013. Discussed  About a year and a half post treatment, still unable to swallow. He is maintaining his weight with a feeding tube. When he does try to swallow, he aspirates and feels that liquids are not going down his esophagus. On exam, his oral cavity and pharynx are extremely dry with sticky secretions. There are no mucosal masses identified down to the hypopharynx and larynx. No palpable adenopathy. Nasal exam clear. Ears are healthy.   Recommend a barium swallow and then possible consideration for esophageal dilatation if that is where the pathology is. Reason For Visit  Discuss dilation of esophagus. Allergies  Codeine Derivatives; Hallucinations HYDROmorphone HCl PF SOLN HYDROmorphone HCl TABS Sulfa Drugs; Swelling. Current Meds  Megestrol Acetate 40 MG Oral Tablet;; RPT Oxymetazoline HCl - 0.05 % Nasal Solution;; RPT LORazepam 0.5 MG Oral Tablet;; RPT Aloe Vera CAPS;; RPT Brimonidine Tartrate SOLN;; RPT Gabapentin TABS;; RPT AmLODIPine Besylate 10 MG Oral Tablet;; RPT OxyCODONE HCl - 20 MG Oral Tablet;; RPT Travoprost 0.004 % Ophthalmic Solution;; RPT. Active Problems  Arthritis   (716.90) (M19.90) Dry mouth   (527.7) (R68.2) Enlarged lymph nodes   (785.6) (R59.1) Glaucoma   (365.9) (H40.9) Hearing loss   (389.9) (H91.90) Heartburn   (787.1) (R12) Hypertension   (401.9) (I10) Protein malnutrition   (260) (E40) Sunburn   (692.71) (L55.9) Urinary calculus   (592.9) (N20.9). 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 PSH  Brain Surgery; for  tic douloure[place] Breast Surgery Mastectomy Cataract Surgery Lithotripsy Oral Surgery Tooth Extraction. Family Hx  Family history of hypertension: Mother,Father (V17.49) (Z82.49) Family history of malignant neoplasm: Grandmother (V16.9) (Z80.9). 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 ROS  Systemic: Feeling tired (fatigue).  No fever, no night sweats, and no recent weight loss. Head: No headache. Eyes: No eye symptoms. Otolaryngeal: No hearing loss, no earache, no tinnitus, and no purulent nasal discharge.  No nasal passage blockage (stuffiness), no snoring, no sneezing, no hoarseness, and no sore throat. Cardiovascular: No chest pain or discomfort  and no palpitations. Pulmonary: No dyspnea, no cough, and no wheezing. Gastrointestinal: No dysphagia  and no heartburn.  No nausea, no abdominal pain, and no melena.  No diarrhea. Genitourinary: No dysuria. Endocrine: Muscle weakness. Musculoskeletal: No calf muscle cramps, no arthralgias, and no soft tissue swelling. Neurological: Dizziness.  No fainting  and no tingling.  Numbness. Psychological: Anxiety  and depression. Skin: No rash. Vital Signs   Recorded by Skolimowski,Sharon on 16 Jan 2013 11:13 AM Weight: 162 lb,  BMI Calculated: 21.97 ,  BSA Calculated: 1.95. Signature  Electronically signed by : Serena Colonel  M.D.; 01/16/2013 12:05 PM EST.

## 2013-02-09 NOTE — Progress Notes (Signed)
Anesthesia chart review:  Patient is an 79 year old male scheduled for esophagoscopy with dilation on 02/14/13 by Dr. Pollyann Kennedy.  History includes former smoker, cancer of the tongue s/p chemoradiation '13 and s/p g-tube, male breast cancer s/p right mastectomy '88, HTN, GERD, HLD, glaucoma, anxiety, impaired hearing, trigeminal neuralgia on Tegretol with history of right decompression of CNV '03, skin cancer s/p melanoma excision, diverticulosis, anemia.  PCP is Dr. Davy Pique.  Preoprative EKG, CXR, and labs noted. He takes meds via his g-tube, so these are going to be held on the morning of surgery since he is a first case.  Velna Ochs [facility] Short Stay Center/Anesthesiology Phone [phone] 02/09/2013 11:23 AM

## 2013-02-14 ENCOUNTER — Encounter (HOSPITAL_COMMUNITY)
Admission: RE | Disposition: A | Discharge status: Home / Self Care | Payer: Self-pay | Source: Ambulatory Visit | Attending: Otolaryngology

## 2013-02-14 ENCOUNTER — Encounter (HOSPITAL_COMMUNITY): Payer: Self-pay | Admitting: *Deleted

## 2013-02-14 ENCOUNTER — Ambulatory Visit (HOSPITAL_COMMUNITY): Payer: Medicare Other | Admitting: Certified Registered Nurse Anesthetist

## 2013-02-14 ENCOUNTER — Ambulatory Visit (HOSPITAL_COMMUNITY)
Admission: RE | Admit: 2013-02-14 | Discharge: 2013-02-14 | Disposition: A | Discharge status: Home / Self Care | Payer: Medicare Other | Source: Ambulatory Visit | Attending: Otolaryngology | Admitting: Otolaryngology

## 2013-02-14 ENCOUNTER — Encounter (HOSPITAL_COMMUNITY): Payer: Medicare Other | Admitting: Vascular Surgery

## 2013-02-14 DIAGNOSIS — R1310 Dysphagia, unspecified: Secondary | ICD-10-CM | POA: Insufficient documentation

## 2013-02-14 DIAGNOSIS — R1314 Dysphagia, pharyngoesophageal phase: Secondary | ICD-10-CM

## 2013-02-14 DIAGNOSIS — Z87891 Personal history of nicotine dependence: Secondary | ICD-10-CM | POA: Insufficient documentation

## 2013-02-14 DIAGNOSIS — Z85819 Personal history of malignant neoplasm of unspecified site of lip, oral cavity, and pharynx: Secondary | ICD-10-CM | POA: Insufficient documentation

## 2013-02-14 DIAGNOSIS — I10 Essential (primary) hypertension: Secondary | ICD-10-CM | POA: Insufficient documentation

## 2013-02-14 DIAGNOSIS — R4789 Other speech disturbances: Secondary | ICD-10-CM | POA: Insufficient documentation

## 2013-02-14 DIAGNOSIS — E46 Unspecified protein-calorie malnutrition: Secondary | ICD-10-CM | POA: Insufficient documentation

## 2013-02-14 HISTORY — PX: ESOPHAGOSCOPY WITH DILITATION: SHX5618

## 2013-02-14 HISTORY — PX: DIRECT LARYNGOSCOPY: SHX5326

## 2013-02-14 SURGERY — ESOPHAGOSCOPY, WITH DILATION
Anesthesia: General | Site: Throat | Wound class: Clean Contaminated

## 2013-02-14 MED ORDER — ONDANSETRON HCL 4 MG/2ML IJ SOLN: 4.0000 mg | Freq: Once | INTRAMUSCULAR | Status: DC | PRN

## 2013-02-14 MED ORDER — LIDOCAINE HCL (CARDIAC) 20 MG/ML IV SOLN
INTRAVENOUS | Status: DC | PRN
  Administered 2013-02-14: 50 mg via INTRAVENOUS

## 2013-02-14 MED ORDER — CARBAMAZEPINE 100 MG/5ML PO SUSP
200.0000 mg | Freq: Once | ORAL | Status: AC
  Administered 2013-02-14: 200 mg
  Filled 2013-02-14: qty 10

## 2013-02-14 MED ORDER — SUCCINYLCHOLINE CHLORIDE 20 MG/ML IJ SOLN
INTRAMUSCULAR | Status: DC | PRN
  Administered 2013-02-14: 100 mg via INTRAVENOUS

## 2013-02-14 MED ORDER — LACTATED RINGERS IV SOLN
INTRAVENOUS | Status: DC | PRN
  Administered 2013-02-14: 08:00:00 via INTRAVENOUS

## 2013-02-14 MED ORDER — ONDANSETRON HCL 4 MG/2ML IJ SOLN
INTRAMUSCULAR | Status: DC | PRN
  Administered 2013-02-14: 4 mg via INTRAVENOUS

## 2013-02-14 MED ORDER — PROPOFOL 10 MG/ML IV BOLUS
INTRAVENOUS | Status: DC | PRN
  Administered 2013-02-14: 130 mg via INTRAVENOUS

## 2013-02-14 MED ORDER — 0.9 % SODIUM CHLORIDE (POUR BTL) OPTIME
TOPICAL | Status: DC | PRN
  Administered 2013-02-14: 1000 mL

## 2013-02-14 MED ORDER — FENTANYL CITRATE 0.05 MG/ML IJ SOLN
INTRAMUSCULAR | Status: DC | PRN
  Administered 2013-02-14: 25 ug via INTRAVENOUS
  Administered 2013-02-14: 100 ug via INTRAVENOUS

## 2013-02-14 MED ORDER — FENTANYL CITRATE 0.05 MG/ML IJ SOLN: 25.0000 ug | INTRAMUSCULAR | Status: DC | PRN

## 2013-02-14 SURGICAL SUPPLY — 15 items
CANISTER SUCTION 2500CC (MISCELLANEOUS) ×3 IMPLANT
COVER TABLE BACK 60X90 (DRAPES) ×3 IMPLANT
CRADLE DONUT ADULT HEAD (MISCELLANEOUS) ×1 IMPLANT
DRAPE PROXIMA HALF (DRAPES) ×3 IMPLANT
GLOVE BIO SURGEON STRL SZ7.5 (GLOVE) ×3 IMPLANT
GLOVE ECLIPSE 7.5 STRL STRAW (GLOVE) ×1 IMPLANT
GLOVE SURG SS PI 6.5 STRL IVOR (GLOVE) ×2 IMPLANT
GOWN STRL NON-REIN LRG LVL3 (GOWN DISPOSABLE) ×7 IMPLANT
KIT ROOM TURNOVER OR (KITS) ×3 IMPLANT
NS IRRIG 1000ML POUR BTL (IV SOLUTION) ×3 IMPLANT
PAD ARMBOARD 7.5X6 YLW CONV (MISCELLANEOUS) ×5 IMPLANT
SPONGE GAUZE 4X4 12PLY (GAUZE/BANDAGES/DRESSINGS) ×3 IMPLANT
SURGILUBE 2OZ TUBE FLIPTOP (MISCELLANEOUS) ×3 IMPLANT
TOWEL OR 17X24 6PK STRL BLUE (TOWEL DISPOSABLE) ×6 IMPLANT
TUBE CONNECTING 12X1/4 (SUCTIONS) ×3 IMPLANT

## 2013-02-14 NOTE — Anesthesia Procedure Notes (Signed)
Procedure Name: Intubation Date/Time: 02/14/2013 8:37 AM Performed by: Elberta Leatherwood Pre-anesthesia Checklist: Patient identified, Emergency Drugs available, Suction available and Patient being monitored Patient Re-evaluated:Patient Re-evaluated prior to inductionOxygen Delivery Method: Circle system utilized Preoxygenation: Pre-oxygenation with 100% oxygen Intubation Type: IV induction Ventilation: Mask ventilation without difficulty and Oral airway inserted - appropriate to patient size Laryngoscope Size: Hyacinth Meeker and 2 Grade View: Grade I Tube type: Oral Tube size: 7.5 mm Number of attempts: 1 Airway Equipment and Method: Stylet and Oral airway Placement Confirmation: ETT inserted through vocal cords under direct vision,  positive ETCO2 and breath sounds checked- equal and bilateral Secured at: 23 cm Tube secured with: Tape Dental Injury: Teeth and Oropharynx as per pre-operative assessment

## 2013-02-14 NOTE — Interval H&P Note (Signed)
History and Physical Interval Note:  02/14/2013 8:05 AM  Jose [name]  has presented today for surgery, with the diagnosis of DYSPHASIA  The various methods of treatment have been discussed with the patient and family. After consideration of risks, benefits and other options for treatment, the patient has consented to  Procedure(s): ESOPHAGOSCOPY WITH DILITATION (N/A) as a surgical intervention .  The patient's history has been reviewed, patient examined, no change in status, stable for surgery.  I have reviewed the patient's chart and labs.  Questions were answered to the patient's satisfaction.     Rana Hochstein

## 2013-02-14 NOTE — Anesthesia Preprocedure Evaluation (Addendum)
Anesthesia Evaluation  Patient identified by MRN, date of birth, ID band Patient awake    Reviewed: Allergy & Precautions, H&P , NPO status , Patient's Chart, lab work & pertinent test results  Airway Mallampati: II TM Distance: >3 FB Neck ROM: Full    Dental  (+) Edentulous Upper and Edentulous Lower   Pulmonary former smoker,  breath sounds clear to auscultation        Cardiovascular hypertension, Pt. on medications Rhythm:Regular Rate:Normal     Neuro/Psych  Headaches, PSYCHIATRIC DISORDERS Anxiety    GI/Hepatic GERD-  Medicated,  Endo/Other    Renal/GU Renal InsufficiencyRenal disease     Musculoskeletal  (+) Arthritis -,   Abdominal   Peds  Hematology   Anesthesia Other Findings   Reproductive/Obstetrics                        Anesthesia Physical Anesthesia Plan  ASA: III  Anesthesia Plan: General   Post-op Pain Management:    Induction: Intravenous  Airway Management Planned: Oral ETT  Additional Equipment:   Intra-op Plan:   Post-operative Plan: Extubation in OR  Informed Consent: I have reviewed the patients History and Physical, chart, labs and discussed the procedure including the risks, benefits and alternatives for the proposed anesthesia with the patient or authorized representative who has indicated his/her understanding and acceptance.   Dental advisory given  Plan Discussed with: CRNA, Anesthesiologist and Surgeon  Anesthesia Plan Comments:         Anesthesia Quick Evaluation

## 2013-02-14 NOTE — Op Note (Signed)
OPERATIVE REPORT  DATE OF SURGERY: 02/14/2013  PATIENT:  Jose [name],  79 y.o. male  PRE-OPERATIVE DIAGNOSIS:  DYSPHASIA  POST-OPERATIVE DIAGNOSIS:  DYSPHASIA  PROCEDURE:  Procedure(s):  DIRECT LARYNGOSCOPY  SURGEON:  Susy Frizzle, MD  ASSISTANTS: none  ANESTHESIA:   General   EBL:  10 ml  DRAINS: none  LOCAL MEDICATIONS USED:  None  SPECIMEN:  none  COUNTS:  Correct  PROCEDURE DETAILS: The patient was taken to the operating room and placed on the operating table in the supine position. Following induction of general endotracheal anesthesia, the table was turned 90 and the patient was draped in a standard fashion. Initially, a Jackson sliding laryngoscope was used to evaluate the larynx and hypopharynx. A Jako laryngoscope was used as well. After careful and thorough inspection of the pharynx, it was revealed that there was no visible opening into the esophagus. The pharyngeal wall continued smoothly down to the arytenoids without an opening into the esophagus. No further attempts were made to cannulate the esophagus. The patient was awakened from anesthesia, extubated and transferred to recovery in stable condition.    PATIENT DISPOSITION:  To PACU, stable

## 2013-02-14 NOTE — Transfer of Care (Signed)
Immediate Anesthesia Transfer of Care Note  Patient: Jose [name]  Procedure(s) Performed: Procedure(s) with comments: ESOPHAGOSCOPY WITH DILITATION (N/A) - Attempted DIRECT LARYNGOSCOPY  Patient Location: PACU  Anesthesia Type:General  Level of Consciousness: awake and alert   Airway & Oxygen Therapy: Patient Spontanous Breathing and Patient connected to nasal cannula oxygen  Post-op Assessment: Report given to PACU RN, Post -op Vital signs reviewed and stable and Patient moving all extremities X 4  Post vital signs: Reviewed and stable  Complications: No apparent anesthesia complications

## 2013-02-14 NOTE — Preoperative (Addendum)
Beta Blockers   Reason not to administer Beta Blockers:Not Applicable 

## 2013-02-14 NOTE — Anesthesia Postprocedure Evaluation (Signed)
  Anesthesia Post-op Note  Patient: Jose [name]  Procedure(s) Performed: Procedure(s) with comments: ESOPHAGOSCOPY WITH DILITATION (N/A) - Attempted DIRECT LARYNGOSCOPY  Patient Location: PACU  Anesthesia Type:General  Level of Consciousness: awake, alert  and oriented  Airway and Oxygen Therapy: Patient Spontanous Breathing  Post-op Pain: mild  Post-op Assessment: Post-op Vital signs reviewed, Patient's Cardiovascular Status Stable, Respiratory Function Stable, RESPIRATORY FUNCTION UNSTABLE, No signs of Nausea or vomiting and Pain level controlled  Post-op Vital Signs: stable  Complications: No apparent anesthesia complications

## 2013-02-19 ENCOUNTER — Encounter (HOSPITAL_COMMUNITY): Payer: Self-pay | Admitting: Otolaryngology

## 2013-02-22 ENCOUNTER — Ambulatory Visit
Admission: RE | Admit: 2013-02-22 | Discharge: 2013-02-22 | Disposition: A | Discharge status: Home / Self Care | Payer: Medicare Other | Source: Ambulatory Visit | Attending: Radiation Oncology | Admitting: Radiation Oncology

## 2013-02-22 DIAGNOSIS — R1013 Epigastric pain: Secondary | ICD-10-CM | POA: Insufficient documentation

## 2013-02-22 DIAGNOSIS — C50929 Malignant neoplasm of unspecified site of unspecified male breast: Secondary | ICD-10-CM | POA: Insufficient documentation

## 2013-02-22 DIAGNOSIS — Q619 Cystic kidney disease, unspecified: Secondary | ICD-10-CM | POA: Insufficient documentation

## 2013-02-22 DIAGNOSIS — N4289 Other specified disorders of prostate: Secondary | ICD-10-CM | POA: Insufficient documentation

## 2013-02-22 DIAGNOSIS — C01 Malignant neoplasm of base of tongue: Secondary | ICD-10-CM | POA: Insufficient documentation

## 2013-02-22 DIAGNOSIS — J4489 Other specified chronic obstructive pulmonary disease: Secondary | ICD-10-CM | POA: Insufficient documentation

## 2013-02-22 DIAGNOSIS — J449 Chronic obstructive pulmonary disease, unspecified: Secondary | ICD-10-CM | POA: Insufficient documentation

## 2013-02-22 DIAGNOSIS — K7689 Other specified diseases of liver: Secondary | ICD-10-CM | POA: Insufficient documentation

## 2013-02-22 DIAGNOSIS — Z79899 Other long term (current) drug therapy: Secondary | ICD-10-CM | POA: Insufficient documentation

## 2013-02-22 DIAGNOSIS — R1310 Dysphagia, unspecified: Secondary | ICD-10-CM | POA: Insufficient documentation

## 2013-02-22 MED ORDER — LARYNGOSCOPY SOLUTION RAD-ONC
15.0000 mL | Freq: Once | TOPICAL | Status: AC
  Administered 2013-02-22: 15 mL via TOPICAL
  Filled 2013-02-22: qty 15

## 2013-02-22 NOTE — Progress Notes (Signed)
Follow up tongue, rad txs 06/21/11-08/04/11 , patient has ear nerve pain, dysphasia still, Dr.Rosen attempted  dialation laryngoscopy on 02/14/13 unable to dilate esophagus, takes osmolite and ensure via peg 6 cans daily and fre water via peg daily, unable to swallow anything not even sips water, has thick saliva, salt water hydrogen peroxide rinses  10:14 AM

## 2013-02-23 NOTE — Progress Notes (Signed)
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  The patient's primary complaint today is difficulty/inability of swallowing. The patient was going to undergo dilation with Dr. Pollyann Kennedy for ongoing dysphasia and he proceeded with laryngoscopy on 02/14/2013. However, dilation could not be performed due to the degree of narrowing. The patient states that he is in the process of exploring options to address the situation which may involve a referral to a nearby academic center. The patient continues therefore to take and his nutrition by feeding tube. This appears to be going relatively well without difficulty. Additional complaint of some thickened saliva. He is using hydrogen peroxide and salt water rinses to help make this.                              ALLERGIES:  is allergic to hydromorphone; morphine and related; and codeine.  Meds: Current Outpatient Prescriptions  Medication Sig Dispense Refill  . amLODipine (NORVASC) 5 MG tablet 2.5 mg by PEG Tube route daily.      . carbamazepine (TEGRETOL) 200 MG tablet 200 mg by PEG Tube route 2 (two) times daily.       . Doxylamine Succinate, Sleep, (SLEEP AID PO) 2 capsules by PEG Tube route at bedtime as needed (sleep).      . LORazepam (ATIVAN) 0.5 MG tablet 0.5 mg by PEG Tube route 2 (two) times daily as needed for anxiety. ANXIETY       . Nutritional Supplements (FEEDING SUPPLEMENT, OSMOLITE 1.5 CAL,) LIQD Increase Osmolite 1.5 via feeding tube to 1.5 cans QID with 60 cc water before and after each bolus TF.  Add 240 cc free water TID between feedings as tolerated.   1422 mL  0  . omeprazole (PRILOSEC) 40 MG capsule Give 1 capsule via peg tube daily (open capsule and mix with liquid to administer through peg tube  30 capsule  3  . Travoprost, BAK Free, (TRAVATAMN) 0.004 % SOLN ophthalmic solution Place 1 drop into both eyes at bedtime.       No current facility-administered medications for this encounter.   Facility-Administered Medications Ordered in Other Encounters  Medication Dose Route Frequency Provider Last Rate Last Dose  . topical emolient (BIAFINE) emulsion   Topical PRN Jonna Coup, MD        Physical Findings: The patient is in no acute distress. Patient is alert and oriented.  vitals were not taken for this visit..   General: Well-developed, in no acute distress HEENT: Normocephalic, atraumatic, no palpable cervical lymphadenopathy Cardiovascular: Regular rate and rhythm Respiratory: Clear to auscultation bilaterally GI: Soft, nontender, normal bowel sounds Extremities: No edema present Fiberoptic exam: After the use of topical anesthetic, the flexible laryngoscope was passed through the right near. Good visualization was obtained. No lesions or suspicious findings within the larynx, hypopharynx, oropharynx or nasopharynx. Significant radiation effect seen on the epiglottis in particular with a change in symmetry, some shrinkage on the left. Some pulling of saliva was present at the superior aspect of the esophagus which was not clearly seen.  Lab Findings: Lab Results  Component Value Date   WBC 4.7 02/08/2013   HGB 13.9 02/08/2013   HCT 40.0 02/08/2013   MCV 91.3 02/08/2013   PLT 208 02/08/2013     Radiographic Findings: Dg Chest 2 View  02/08/2013   CLINICAL DATA:  Dysphagia.  EXAM: CHEST  2 VIEW  COMPARISON:  Chest CT 12/08/2012.  Chest x-ray 04/21/2011.  FINDINGS: Mediastinum and hilar structures are normal. Lungs are clear of infiltrates. COPD. Heart size and pulmonary vascularity normal. No pleural effusion or  pneumothorax. Surgical clips right axilla. Degenerative changes thoracic spine. What appears to be a gastrostomy catheter noted over left upper quadrant.  IMPRESSION: No active cardiopulmonary disease. COPD. Stable chest from 04/21/2011.   Electronically Signed   By: Maisie Fus  Register   On: 02/08/2013 15:36   Ct Abdomen Pelvis W Contrast  02/02/2013   CLINICAL DATA:  Epigastric pain. History of squamous cell carcinoma of the tongue and right breast cancer.  EXAM: CT ABDOMEN AND PELVIS WITH CONTRAST  TECHNIQUE: Multidetector CT imaging of the abdomen and pelvis was performed using the standard protocol following bolus administration of intravenous contrast.  CONTRAST:  OMNIPAQUE IOHEXOL 300 MG/ML  SOLN  COMPARISON:  None. The patient's prior CT scan from November 2012 is not available on PACs for comparison.  FINDINGS: The spleen, pancreas, gallbladder, adrenal glands are normal. There is mild diffuse fatty infiltration of liver. No focal lesion is identified within the liver. There is a 8 mm splenule anterior to the lateral spleen. The kidneys are normal. There are bilateral parapelvic renal cysts. There is no abdominal lymphadenopathy. There is no small bowel obstruction or diverticulitis. The appendix is normal. 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  Fluid-filled bladder is normal. Prostate calcifications are noted. Prostate gland measures 5.9 cm in diameter and 4.4 cm in anterior-posterior dimension. The visualized lung bases are clear. There are degenerative joint changes of of the spine.  IMPRESSION: No acute abnormality identified in the abdomen and pelvis. No evidence of metastasis.   Electronically Signed   By: Sherian Rein M.D.   On: 02/02/2013 10:12    Impression:    Clinically NED. The patient is very concerned at this time about his difficulty  swallowing, and dilation procedure was unsuccessful due to the degree of narrowing/fibrosis. Hopefully this will be able to be addressed. The patient wanted me to contact Dr. Raelyn Number is regarding this and I will try to reach him. The patient today seems motivated to try to improve his swallowing. Previously, he had discontinued swallowing exercises and was not interested in further evaluation/management through speech/swallowing therapy.  Plan:  The patient will continue routine followup with me and I will try to get some additional information about status of esophageal narrowing.   I spent 25 minutes with the patient today, the majority of which was spent counseling the patient on the diagnosis of cancer and coordinating care.     Radene Gunning, M.D., Ph.D.

## 2013-03-23 ENCOUNTER — Ambulatory Visit (INDEPENDENT_AMBULATORY_CARE_PROVIDER_SITE_OTHER): Payer: Medicare Other | Admitting: Internal Medicine

## 2013-03-23 ENCOUNTER — Encounter: Payer: Self-pay | Admitting: Internal Medicine

## 2013-03-23 VITALS — BP 120/70 | HR 83 | Temp 97.9°F | Resp 20 | Ht 71.0 in | Wt 162.0 lb

## 2013-03-23 DIAGNOSIS — C01 Malignant neoplasm of base of tongue: Secondary | ICD-10-CM

## 2013-03-23 DIAGNOSIS — E785 Hyperlipidemia, unspecified: Secondary | ICD-10-CM

## 2013-03-23 DIAGNOSIS — G500 Trigeminal neuralgia: Secondary | ICD-10-CM

## 2013-03-23 DIAGNOSIS — I10 Essential (primary) hypertension: Secondary | ICD-10-CM

## 2013-03-23 MED ORDER — ZOLPIDEM TARTRATE 5 MG PO TABS: 5.0000 mg | ORAL_TABLET | Freq: Every evening | ORAL | Status: DC | PRN

## 2013-03-23 MED ORDER — TRAMADOL HCL 50 MG PO TABS: 50.0000 mg | ORAL_TABLET | Freq: Three times a day (TID) | ORAL | Status: DC | PRN

## 2013-03-23 MED ORDER — AMLODIPINE BESYLATE 5 MG PO TABS: 2.5000 mg | ORAL_TABLET | Freq: Every day | ORAL | Status: DC

## 2013-03-23 NOTE — Progress Notes (Signed)
Subjective:    Patient ID: Jose [name], male    DOB: December 15, 1927, 80 y.o.   MRN: [id number]  HPI   Pre-visit discussion using our clinic review tool. No additional management support is needed unless otherwise documented below in the visit note.  An 80 year old patient who is seen today for followup. He was last seen here approximately 13 months ago but is followed by multiple specialists. He has a history of cancer involving the base of the tongue and is status post radiotherapy as well as chemotherapy. He is scheduled for followup at [facility] soon apparently in an attempt to dilate an esophageal stricture. History of hypertension which has been well controlled. He is also followed at [facility]. His blood pressure has been well-controlled. He remains on Tegretol for trigeminal neuralgia. He had surgery approximately 10 years ago and he feels that his symptoms are now worsening. He has remote history of breast cancer and melanoma. His weight has been stable over the past 12 months but requires PEG feedings. He is unable to tolerate anything by mouth due to his stricture  Past Medical History  Diagnosis Date  . Blood transfusion     2006 had 4 units  . Arthritis     knees,back,shoulders  . Anxiety   . Right ureteral stone S/P URETERAL STENT 01-24-11  AND ESWL  03-01-11  . History of acute renal failure 01-23-11    DUE TO HYDRONEPHROSIS AND RIGHT STONE  . Normal cardiac stress test 09-25-2007  . Glaucoma   . Cataract immature   . Diverticulosis of colon   . History of GI diverticular bleed   . Hyperlipemia   . Impaired hearing NOT WEARING HIS BILATERL AIDS  . Urgency of urination     DUE TO URETERAL STONE AND STENT  . Frequency of urination   . Insomnia   . Anemia   . Chronic kidney disease     kidney stones  . GERD (gastroesophageal reflux disease)   . Hernia     inguinal  . Headache(784.0)     migraines  . Trigeminal neuralgia   . Hypertension   .  History of radiation therapy 06/21/11-08/04/11    tongue/h/n 69.96 gy in 33 fxs  . History of chemotherapy 07/26/11    taxol/carbo completed/stopped 07/26/11  . 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 G tube feedings   . History of radiation therapy 06/21/11-08/04/11    base of tongue  . Male breast cancer 1988    S/P RIGHT MASTECTOMY W/ NODE DISSECTION AND CHEMO  . 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 Skin cancer 2002    melanoma- back  . Colon polyps   . Diverticulosis     History   Social History  . Marital Status: Married    Spouse Name: N/A    Number of Children: 2  . Years of Education: N/A   Occupational History  . retired    Social History Main Topics  . Smoking status: Former Smoker -- 0.25 packs/day for 30 years    Types: Cigarettes, Cigars    Quit date: 12/20/1980  . Smokeless tobacco: Never Used  . Alcohol Use: No  . Drug Use: No  . Sexual Activity: Yes   Other Topics Concern  . Not on file   Social History Narrative  . No narrative on file    Past Surgical History  Procedure Laterality Date  . Cystoscopy w/ ureteral  stent placement  01/24/2011    Procedure: CYSTOSCOPY WITH RETROGRADE PYELOGRAM/URETERAL STENT PLACEMENT;  Surgeon: Fredricka Bonine, MD;  Location: [facility];  Service: Urology;  Laterality: Right;  . Left leg surg for fx  AGE 48    patient states left thigh  . Craniectomy suboccipital for exploration / decompression cranial nerves  2003    5TH CRANIAL NERVE DECOMPRESSION  . Simple mastectomy  1988    MALE--- RIGHT BREAST W/ NODE DISSECTION  . Melanoma excision  10 YRS AGO    BACK AREA  . Extracorporeal shock wave lithotripsy  03-01-11    RIGHT  . Ureteroscopy  03/26/2011    Procedure: URETEROSCOPY;  Surgeon: Fredricka Bonine, MD;  Location: [facility];  Service: Urology;  Laterality: Right;  RIGHT URETEROSCOPY, HOLMIUM LASER AND STENT   . Examination under anesthesia  03/26/2011    Procedure: EXAM UNDER  ANESTHESIA;  Surgeon: Fredricka Bonine, MD;  Location: [facility];  Service: Urology;  Laterality: N/A;  . Lymph node biopsy  04/21/2011    Procedure: LYMPH NODE BIOPSY;  Surgeon: Beckie Salts, MD;  Location: [place];  Service: ENT;  Laterality: Right;  . Gastrostomy tube placement    . Esophagoscopy with dilitation N/A 02/14/2013    Procedure: ESOPHAGOSCOPY WITH DILITATION;  Surgeon: Izora Gala, MD;  Location: [place];  Service: ENT;  Laterality: N/A;  Attempted  . Direct laryngoscopy  02/14/2013    Procedure: DIRECT LARYNGOSCOPY;  Surgeon: Izora Gala, MD;  Location: [facility];  Service: ENT;;    Family History  Problem Relation Age of Onset  . Heart disease Mother   . Heart disease Father   . Cancer Sister     "perineum"  . Stomach cancer Paternal Grandmother     Allergies  Allergen Reactions  . Hydromorphone Other (See Comments)    phlebitis  . Morphine And Related Other (See Comments)    phlebitis  . Codeine Other (See Comments)    Dizzy, mental status changes    Current Outpatient Prescriptions on File Prior to Visit  Medication Sig Dispense Refill  . carbamazepine (TEGRETOL) 200 MG tablet 200 mg by PEG Tube route 2 (two) times daily.       . Doxylamine Succinate, Sleep, (SLEEP AID PO) 2 capsules by PEG Tube route at bedtime as needed (sleep).      . LORazepam (ATIVAN) 0.5 MG tablet 0.5 mg by PEG Tube route 2 (two) times daily as needed for anxiety. ANXIETY       . Nutritional Supplements (FEEDING SUPPLEMENT, OSMOLITE 1.5 CAL,) LIQD Increase Osmolite 1.5 via feeding tube to 1.5 cans QID with 60 cc water before and after each bolus TF.  Add 240 cc free water TID between feedings as tolerated.  1422 mL  0  . omeprazole (PRILOSEC) 40 MG capsule Give 1 capsule via peg tube daily (open capsule and mix with liquid to administer through peg tube  30 capsule  3  . Travoprost, BAK Free, (TRAVATAMN) 0.004 % SOLN ophthalmic solution Place 1 drop into both eyes at  bedtime.       Current Facility-Administered Medications on File Prior to Visit  Medication Dose Route Frequency Provider Last Rate Last Dose  . topical emolient (BIAFINE) emulsion   Topical PRN Marye Round, MD        BP 120/70  Pulse 83  Temp(Src) 97.9 F (36.6 C) (Oral)  Resp 20  Ht 5\' 11"  (1.803 m)  Wt  162 lb (73.483 kg)  BMI 22.60 kg/m2  SpO2 98%       Review of Systems  Constitutional: Negative for fever, chills, appetite change and fatigue.  HENT: Negative for congestion, dental problem, ear pain, hearing loss, sore throat, tinnitus, trouble swallowing and voice change.   Eyes: Negative for pain, discharge and visual disturbance.  Respiratory: Negative for cough, chest tightness, wheezing and stridor.   Cardiovascular: Negative for chest pain, palpitations and leg swelling.  Gastrointestinal: Negative for nausea, vomiting, abdominal pain, diarrhea, constipation, blood in stool and abdominal distention.  Genitourinary: Negative for urgency, hematuria, flank pain, discharge, difficulty urinating and genital sores.  Musculoskeletal: Negative for arthralgias, back pain, gait problem, joint swelling, myalgias and neck stiffness.  Skin: Negative for rash.  Neurological: Negative for dizziness, syncope, speech difficulty, weakness, numbness and headaches.  Hematological: Negative for adenopathy. Does not bruise/bleed easily.  Psychiatric/Behavioral: Positive for sleep disturbance. Negative for behavioral problems and dysphoric mood. The patient is nervous/anxious.        Objective:   Physical Exam  Constitutional: He is oriented to person, place, and time. He appears well-developed and well-nourished. No distress.  HENT:  Head: Normocephalic.  Right Ear: External ear normal.  Left Ear: External ear normal.  Eyes: Conjunctivae and EOM are normal.  Neck: Normal range of motion.  Cardiovascular: Normal rate and normal heart sounds.   Pulmonary/Chest: Breath sounds  normal.  Abdominal: Bowel sounds are normal.  PEG tube noted  Musculoskeletal: Normal range of motion. He exhibits no edema and no tenderness.  Neurological: He is alert and oriented to person, place, and time.  Psychiatric: He has a normal mood and affect. His behavior is normal.          Assessment & Plan:    hypertension well controlled  Trigeminal neuralgia  History of dyslipidemia  Status post treatment for cancer involving the base of the tongue  Dysphasia /esophageal stricture   Medications updated  Followup multiple consultants  Recheck here one year or as needed

## 2013-03-23 NOTE — Progress Notes (Signed)
   Subjective:    Patient ID: Jose [name], male    DOB: Aug 14, 1927, 80 y.o.   MRN: [id number]  HPI Wt Readings from Last 3 Encounters:  03/23/13 162 lb (73.483 kg)  02/08/13 159 lb 4 oz (72.235 kg)  01/30/13 161 lb 6.4 oz (73.211 kg)     Review of Systems     Objective:   Physical Exam        Assessment & Plan:

## 2013-03-23 NOTE — Progress Notes (Signed)
Pre-visit discussion using our clinic review tool. No additional management support is needed unless otherwise documented below in the visit note.  

## 2013-03-23 NOTE — Patient Instructions (Addendum)
It is important that you exercise regularly, at least 20 minutes 3 to 4 times per week.  If you develop chest pain or shortness of breath seek  medical attention.  Insomnia Insomnia is frequent trouble falling and/or staying asleep. Insomnia can be a long term problem or a short term problem. Both are common. Insomnia can be a short term problem when the wakefulness is related to a certain stress or worry. Long term insomnia is often related to ongoing stress during waking hours and/or poor sleeping habits. Overtime, sleep deprivation itself can make the problem worse. Every little thing feels more severe because you are overtired and your ability to cope is decreased. CAUSES   Stress, anxiety, and depression.  Poor sleeping habits.  Distractions such as TV in the bedroom.  Naps close to bedtime.  Engaging in emotionally charged conversations before bed.  Technical reading before sleep.  Alcohol and other sedatives. They may make the problem worse. They can hurt normal sleep patterns and normal dream activity.  Stimulants such as caffeine for several hours prior to bedtime.  Pain syndromes and shortness of breath can cause insomnia.  Exercise late at night.  Changing time zones may cause sleeping problems (jet lag). It is sometimes helpful to have someone observe your sleeping patterns. They should look for periods of not breathing during the night (sleep apnea). They should also look to see how long those periods last. If you live alone or observers are uncertain, you can also be observed at a sleep clinic where your sleep patterns will be professionally monitored. Sleep apnea requires a checkup and treatment. Give your caregivers your medical history. Give your caregivers observations your family has made about your sleep.  SYMPTOMS   Not feeling rested in the morning.  Anxiety and restlessness at bedtime.  Difficulty falling and staying asleep. TREATMENT   Your caregiver may  prescribe treatment for an underlying medical disorders. Your caregiver can give advice or help if you are using alcohol or other drugs for self-medication. Treatment of underlying problems will usually eliminate insomnia problems.  Medications can be prescribed for short time use. They are generally not recommended for lengthy use.  Over-the-counter sleep medicines are not recommended for lengthy use. They can be habit forming.  You can promote easier sleeping by making lifestyle changes such as:  Using relaxation techniques that help with breathing and reduce muscle tension.  Exercising earlier in the day.  Changing your diet and the time of your last meal. No night time snacks.  Establish a regular time to go to bed.  Counseling can help with stressful problems and worry.  Soothing music and white noise may be helpful if there are background noises you cannot remove.  Stop tedious detailed work at least one hour before bedtime. HOME CARE INSTRUCTIONS   Keep a diary. Inform your caregiver about your progress. This includes any medication side effects. See your caregiver regularly. Take note of:  Times when you are asleep.  Times when you are awake during the night.  The quality of your sleep.  How you feel the next day. This information will help your caregiver care for you.  Get out of bed if you are still awake after 15 minutes. Read or do some quiet activity. Keep the lights down. Wait until you feel sleepy and go back to bed.  Keep regular sleeping and waking hours. Avoid naps.  Exercise regularly.  Avoid distractions at bedtime. Distractions include watching television or engaging in  any intense or detailed activity like attempting to balance the household checkbook.  Develop a bedtime ritual. Keep a familiar routine of bathing, brushing your teeth, climbing into bed at the same time each night, listening to soothing music. Routines increase the success of falling to  sleep faster.  Use relaxation techniques. This can be using breathing and muscle tension release routines. It can also include visualizing peaceful scenes. You can also help control troubling or intruding thoughts by keeping your mind occupied with boring or repetitive thoughts like the old concept of counting sheep. You can make it more creative like imagining planting one beautiful flower after another in your backyard garden.  During your day, work to eliminate stress. When this is not possible use some of the previous suggestions to help reduce the anxiety that accompanies stressful situations. MAKE SURE YOU:   Understand these instructions.  Will watch your condition.  Will get help right away if you are not doing well or get worse. Document Released: 03/05/2000 Document Revised: 05/31/2011 Document Reviewed: 04/05/2007 [facility] Patient Information 2014 [place].

## 2013-03-30 ENCOUNTER — Other Ambulatory Visit: Payer: Self-pay | Admitting: Internal Medicine

## 2013-03-30 MED ORDER — TRAZODONE HCL 50 MG PO TABS: 25.0000 mg | ORAL_TABLET | Freq: Every evening | ORAL | Status: DC | PRN

## 2013-04-25 ENCOUNTER — Other Ambulatory Visit: Payer: Self-pay | Admitting: Physician Assistant

## 2013-05-01 HISTORY — PX: ESOPHAGEAL DILATION: SHX303

## 2013-05-03 HISTORY — PX: OTHER SURGICAL HISTORY: SHX169

## 2013-05-04 DIAGNOSIS — J939 Pneumothorax, unspecified: Secondary | ICD-10-CM | POA: Insufficient documentation

## 2013-05-06 DIAGNOSIS — IMO0001 Reserved for inherently not codable concepts without codable children: Secondary | ICD-10-CM | POA: Insufficient documentation

## 2013-05-06 DIAGNOSIS — Z931 Gastrostomy status: Secondary | ICD-10-CM | POA: Insufficient documentation

## 2013-05-06 DIAGNOSIS — C109 Malignant neoplasm of oropharynx, unspecified: Secondary | ICD-10-CM | POA: Insufficient documentation

## 2013-05-06 DIAGNOSIS — K668 Other specified disorders of peritoneum: Secondary | ICD-10-CM | POA: Insufficient documentation

## 2013-05-06 DIAGNOSIS — K222 Esophageal obstruction: Secondary | ICD-10-CM | POA: Insufficient documentation

## 2013-05-17 ENCOUNTER — Emergency Department (HOSPITAL_COMMUNITY): Payer: Medicare Other

## 2013-05-17 ENCOUNTER — Inpatient Hospital Stay (HOSPITAL_COMMUNITY)
Admission: EM | Admit: 2013-05-17 | Discharge: 2013-05-21 | DRG: 193 | Disposition: A | Discharge status: Skilled Nursing Facility | Payer: Medicare Other | Attending: Internal Medicine | Admitting: Internal Medicine

## 2013-05-17 ENCOUNTER — Encounter (HOSPITAL_COMMUNITY): Payer: Self-pay | Admitting: Emergency Medicine

## 2013-05-17 DIAGNOSIS — F0390 Unspecified dementia without behavioral disturbance: Secondary | ICD-10-CM | POA: Diagnosis present

## 2013-05-17 DIAGNOSIS — R0602 Shortness of breath: Secondary | ICD-10-CM | POA: Diagnosis present

## 2013-05-17 DIAGNOSIS — R5383 Other fatigue: Secondary | ICD-10-CM

## 2013-05-17 DIAGNOSIS — J189 Pneumonia, unspecified organism: Principal | ICD-10-CM | POA: Diagnosis present

## 2013-05-17 DIAGNOSIS — R1310 Dysphagia, unspecified: Secondary | ICD-10-CM | POA: Diagnosis present

## 2013-05-17 DIAGNOSIS — C01 Malignant neoplasm of base of tongue: Secondary | ICD-10-CM

## 2013-05-17 DIAGNOSIS — IMO0002 Reserved for concepts with insufficient information to code with codable children: Secondary | ICD-10-CM

## 2013-05-17 DIAGNOSIS — Z860100 Personal history of colon polyps, unspecified: Secondary | ICD-10-CM

## 2013-05-17 DIAGNOSIS — R627 Adult failure to thrive: Secondary | ICD-10-CM | POA: Diagnosis present

## 2013-05-17 DIAGNOSIS — E785 Hyperlipidemia, unspecified: Secondary | ICD-10-CM | POA: Diagnosis present

## 2013-05-17 DIAGNOSIS — Z853 Personal history of malignant neoplasm of breast: Secondary | ICD-10-CM

## 2013-05-17 DIAGNOSIS — Z8249 Family history of ischemic heart disease and other diseases of the circulatory system: Secondary | ICD-10-CM

## 2013-05-17 DIAGNOSIS — R0902 Hypoxemia: Secondary | ICD-10-CM | POA: Diagnosis present

## 2013-05-17 DIAGNOSIS — H9190 Unspecified hearing loss, unspecified ear: Secondary | ICD-10-CM | POA: Diagnosis present

## 2013-05-17 DIAGNOSIS — Z9221 Personal history of antineoplastic chemotherapy: Secondary | ICD-10-CM

## 2013-05-17 DIAGNOSIS — I10 Essential (primary) hypertension: Secondary | ICD-10-CM | POA: Diagnosis present

## 2013-05-17 DIAGNOSIS — Z87442 Personal history of urinary calculi: Secondary | ICD-10-CM

## 2013-05-17 DIAGNOSIS — H409 Unspecified glaucoma: Secondary | ICD-10-CM | POA: Diagnosis present

## 2013-05-17 DIAGNOSIS — Z923 Personal history of irradiation: Secondary | ICD-10-CM

## 2013-05-17 DIAGNOSIS — G9340 Encephalopathy, unspecified: Secondary | ICD-10-CM | POA: Diagnosis present

## 2013-05-17 DIAGNOSIS — R5381 Other malaise: Secondary | ICD-10-CM | POA: Diagnosis present

## 2013-05-17 DIAGNOSIS — Z9010 Acquired absence of unspecified breast and nipple: Secondary | ICD-10-CM

## 2013-05-17 DIAGNOSIS — Z85820 Personal history of malignant melanoma of skin: Secondary | ICD-10-CM

## 2013-05-17 DIAGNOSIS — Z87891 Personal history of nicotine dependence: Secondary | ICD-10-CM

## 2013-05-17 DIAGNOSIS — K219 Gastro-esophageal reflux disease without esophagitis: Secondary | ICD-10-CM | POA: Diagnosis present

## 2013-05-17 DIAGNOSIS — Z931 Gastrostomy status: Secondary | ICD-10-CM

## 2013-05-17 DIAGNOSIS — F411 Generalized anxiety disorder: Secondary | ICD-10-CM | POA: Diagnosis present

## 2013-05-17 DIAGNOSIS — Z85810 Personal history of malignant neoplasm of tongue: Secondary | ICD-10-CM | POA: Diagnosis present

## 2013-05-17 LAB — URINE MICROSCOPIC-ADD ON

## 2013-05-17 LAB — COMPREHENSIVE METABOLIC PANEL
ALT: 79 U/L — ABNORMAL HIGH (ref 0–53)
AST: 70 U/L — ABNORMAL HIGH (ref 0–37)
Albumin: 2.4 g/dL — ABNORMAL LOW (ref 3.5–5.2)
Alkaline Phosphatase: 212 U/L — ABNORMAL HIGH (ref 39–117)
BUN: 27 mg/dL — AB (ref 6–23)
CALCIUM: 9.1 mg/dL (ref 8.4–10.5)
CO2: 24 mEq/L (ref 19–32)
Chloride: 99 mEq/L (ref 96–112)
Creatinine, Ser: 1.1 mg/dL (ref 0.50–1.35)
GFR, EST AFRICAN AMERICAN: 70 mL/min — AB (ref >90)
GFR, EST NON AFRICAN AMERICAN: 60 mL/min — AB (ref >90)
GLUCOSE: 150 mg/dL — AB (ref 70–99)
Potassium: 4.6 mEq/L (ref 3.7–5.3)
SODIUM: 136 meq/L — AB (ref 137–147)
Total Bilirubin: 0.6 mg/dL (ref 0.3–1.2)
Total Protein: 6.5 g/dL (ref 6.0–8.3)

## 2013-05-17 LAB — CBC WITH DIFFERENTIAL/PLATELET
Basophils Absolute: 0 10*3/uL (ref 0.0–0.1)
Basophils Relative: 0 % (ref 0–1)
EOS ABS: 0 10*3/uL (ref 0.0–0.7)
EOS PCT: 0 % (ref 0–5)
HCT: 30.6 % — ABNORMAL LOW (ref 39.0–52.0)
Hemoglobin: 10.4 g/dL — ABNORMAL LOW (ref 13.0–17.0)
Lymphocytes Relative: 5 % — ABNORMAL LOW (ref 12–46)
Lymphs Abs: 0.5 10*3/uL — ABNORMAL LOW (ref 0.7–4.0)
MCH: 30.9 pg (ref 26.0–34.0)
MCHC: 34 g/dL (ref 30.0–36.0)
MCV: 90.8 fL (ref 78.0–100.0)
MONO ABS: 1.6 10*3/uL — AB (ref 0.1–1.0)
MONOS PCT: 14 % — AB (ref 3–12)
Neutro Abs: 9.3 10*3/uL — ABNORMAL HIGH (ref 1.7–7.7)
Neutrophils Relative %: 81 % — ABNORMAL HIGH (ref 43–77)
PLATELETS: 399 10*3/uL (ref 150–400)
RBC: 3.37 MIL/uL — AB (ref 4.22–5.81)
RDW: 13 % (ref 11.5–15.5)
WBC: 11.5 10*3/uL — ABNORMAL HIGH (ref 4.0–10.5)

## 2013-05-17 LAB — URINALYSIS, ROUTINE W REFLEX MICROSCOPIC
Bilirubin Urine: NEGATIVE
Glucose, UA: NEGATIVE mg/dL
Hgb urine dipstick: NEGATIVE
Ketones, ur: NEGATIVE mg/dL
Leukocytes, UA: NEGATIVE
NITRITE: NEGATIVE
Protein, ur: 100 mg/dL — AB
SPECIFIC GRAVITY, URINE: 1.023 (ref 1.005–1.030)
UROBILINOGEN UA: 0.2 mg/dL (ref 0.0–1.0)
pH: 7 (ref 5.0–8.0)

## 2013-05-17 LAB — LIPASE, BLOOD: Lipase: 65 U/L — ABNORMAL HIGH (ref 11–59)

## 2013-05-17 LAB — TROPONIN I: Troponin I: <0.3 ng/mL (ref <0.30)

## 2013-05-17 LAB — I-STAT CG4 LACTIC ACID, ED: LACTIC ACID, VENOUS: 1.35 mmol/L (ref 0.5–2.2)

## 2013-05-17 MED ORDER — VANCOMYCIN HCL IN DEXTROSE 1-5 GM/200ML-% IV SOLN: 1000.0000 mg | INTRAVENOUS | Status: DC

## 2013-05-17 MED ORDER — SODIUM CHLORIDE 0.9 % IV BOLUS (SEPSIS)
1000.0000 mL | Freq: Once | INTRAVENOUS | Status: AC
  Administered 2013-05-17: 1000 mL via INTRAVENOUS

## 2013-05-17 MED ORDER — VANCOMYCIN HCL 10 G IV SOLR
1250.0000 mg | Freq: Once | INTRAVENOUS | Status: AC
  Administered 2013-05-17: 1250 mg via INTRAVENOUS
  Filled 2013-05-17: qty 1250

## 2013-05-17 MED ORDER — DEXTROSE 5 % IV SOLN
1.0000 g | Freq: Once | INTRAVENOUS | Status: AC
  Administered 2013-05-17: 1 g via INTRAVENOUS
  Filled 2013-05-17: qty 1

## 2013-05-17 MED ORDER — VANCOMYCIN HCL 10 G IV SOLR
1250.0000 mg | INTRAVENOUS | Status: DC
  Filled 2013-05-17: qty 1250

## 2013-05-17 NOTE — ED Provider Notes (Signed)
CSN: [id number]     Arrival date & time 05/17/13  1925 History   First MD Initiated Contact with Patient 05/17/13 1932     Chief Complaint  Patient presents with  . Failure To Thrive   HPI  Patient presents with concerns weakness, mild dyspnea, no pain. Patient states that symptoms began several days ago, has progressed in the past days. 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 Patient is a poor historian, but it seems as though symptoms have progressed, with no clear precipitant, nor any clear alleviating or exacerbating factors. The patient self denies pain, confusion, state that he does feel progressively weak and lightheaded as above.   Past Medical History  Diagnosis Date  . Blood transfusion     2006 had 4 units  . Arthritis     knees,back,shoulders  . Anxiety   . Right ureteral stone S/P URETERAL STENT 01-24-11  AND ESWL  03-01-11  . History of acute renal failure 01-23-11    DUE TO HYDRONEPHROSIS AND RIGHT STONE  . Normal cardiac stress test 09-25-2007  . Glaucoma   . Cataract immature   . Diverticulosis of colon   . History of GI diverticular bleed   . Hyperlipemia   . Impaired hearing NOT WEARING HIS BILATERL AIDS  . Urgency of urination     DUE TO URETERAL STONE AND STENT  . Frequency of urination   . Insomnia   . Anemia   . Chronic kidney disease     kidney stones  . GERD (gastroesophageal reflux disease)   . Hernia     inguinal  . Headache(784.0)     migraines  . Trigeminal neuralgia   . Hypertension   . History of radiation therapy 06/21/11-08/04/11    tongue/h/n 69.96 gy in 33 fxs  . History of chemotherapy 07/26/11    taxol/carbo completed/stopped 07/26/11  . 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 G tube feedings   . History of radiation therapy 06/21/11-08/04/11    base of tongue  . Male breast cancer 1988    S/P RIGHT MASTECTOMY W/ NODE DISSECTION AND CHEMO  . 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 Skin cancer 2002    melanoma- back  . Colon polyps   . Diverticulosis    Past Surgical History  Procedure Laterality Date  . Cystoscopy w/ ureteral stent placement  01/24/2011    Procedure: CYSTOSCOPY WITH RETROGRADE PYELOGRAM/URETERAL STENT PLACEMENT;  Surgeon: Fredricka Bonine, MD;  Location: [facility];  Service: Urology;  Laterality: Right;  . Left leg surg for fx  AGE 89    patient states left thigh  . Craniectomy suboccipital for exploration / decompression cranial nerves  2003    5TH CRANIAL NERVE DECOMPRESSION  . Simple mastectomy  1988    MALE--- RIGHT BREAST W/ NODE DISSECTION  . Melanoma excision  10 YRS AGO    BACK AREA  . Extracorporeal shock wave lithotripsy  03-01-11    RIGHT  . Ureteroscopy  03/26/2011    Procedure: URETEROSCOPY;  Surgeon: Fredricka Bonine, MD;  Location: [facility];  Service: Urology;  Laterality: Right;  RIGHT URETEROSCOPY, HOLMIUM LASER AND STENT   . Examination under anesthesia  03/26/2011    Procedure: EXAM UNDER ANESTHESIA;  Surgeon: Fredricka Bonine, MD;  Location: [facility];  Service: Urology;  Laterality: N/A;  . Lymph node  biopsy  04/21/2011    Procedure: LYMPH NODE BIOPSY;  Surgeon: Beckie Salts, MD;  Location: [place];  Service: ENT;  Laterality: Right;  . Gastrostomy tube placement    . Esophagoscopy with dilitation N/A 02/14/2013    Procedure: ESOPHAGOSCOPY WITH DILITATION;  Surgeon: Izora Gala, MD;  Location: [place];  Service: ENT;  Laterality: N/A;  Attempted  . Direct laryngoscopy  02/14/2013    Procedure: DIRECT LARYNGOSCOPY;  Surgeon: Izora Gala, MD;  Location: [facility];  Service: ENT;;   Family History  Problem Relation Age of Onset  . Heart disease Mother   . Heart disease Father   . Cancer Sister     "perineum"  . Stomach cancer Paternal Grandmother    History  Substance Use Topics  . Smoking status: Former Smoker -- 0.25 packs/day for 30 years     Types: Cigarettes, Cigars    Quit date: 12/20/1980  . Smokeless tobacco: Never Used  . Alcohol Use: No    Review of Systems  Constitutional:       Per HPI, otherwise negative  HENT:       Per HPI, otherwise negative  Respiratory:       Per HPI, otherwise negative  Cardiovascular:       Per HPI, otherwise negative  Gastrointestinal: Negative for vomiting.  Endocrine:       Negative aside from HPI  Genitourinary:       Neg aside from HPI   Musculoskeletal:       Per HPI, otherwise negative  Skin: Negative.   Neurological: Negative for syncope.      Allergies  Hydromorphone; Morphine and related; and Codeine  Home Medications   Current Outpatient Rx  Name  Route  Sig  Dispense  Refill  . amLODipine (NORVASC) 5 MG tablet   Oral   Take 0.5 tablets (2.5 mg total) by mouth daily.   30 tablet   2   . carbamazepine (TEGRETOL) 200 MG tablet   PEG Tube   200 mg by PEG Tube route 3 (three) times daily.          . Doxylamine Succinate, Sleep, (SLEEP AID PO)   PEG Tube   2 capsules by PEG Tube route at bedtime as needed (sleep).         . LORazepam (ATIVAN) 0.5 MG tablet   PEG Tube   0.5 mg by PEG Tube route 3 (three) times daily as needed for anxiety. ANXIETY          . Nutritional Supplements (FEEDING SUPPLEMENT, OSMOLITE 1.5 CAL,) LIQD      Increase Osmolite 1.5 via feeding tube to 1.5 cans QID with 60 cc water before and after each bolus TF.  Add 240 cc free water TID between feedings as tolerated.   1422 mL   0     Dispense as written.   Marland Kitchen omeprazole (PRILOSEC) 40 MG capsule   Feeding Tube   40 mg by Feeding Tube route daily.         . tamsulosin (FLOMAX) 0.4 MG CAPS capsule   Oral   Take 0.4 mg by mouth daily.         . traMADol (ULTRAM) 50 MG tablet   Oral   Take 1 tablet (50 mg total) by mouth every 8 (eight) hours as needed.   90 tablet   0   . Travoprost, BAK Free, (TRAVATAMN) 0.004 % SOLN ophthalmic solution   Both Eyes   Place  1 drop into both eyes at bedtime.         Marland Kitchen zolpidem (AMBIEN) 5 MG tablet   Oral   Take 1 tablet (5 mg total) by mouth at bedtime as needed for sleep.   30 tablet   1    BP 164/63  Pulse 84  Temp(Src) 99.7 F (37.6 C) (Rectal)  Resp 19  SpO2 91% Physical Exam  Nursing note and vitals reviewed. Constitutional: He is oriented to person, place, and time. He appears cachectic. He has a sickly appearance.  HENT:  Head: Normocephalic and atraumatic.  Eyes: Conjunctivae and EOM are normal.  Cardiovascular: Normal rate and regular rhythm.   Pulmonary/Chest: Effort normal. No stridor. He has decreased breath sounds.  Abdominal: He exhibits no distension.    Musculoskeletal: He exhibits no edema.  Neurological: He is alert and oriented to person, place, and time.  Skin: Skin is warm and dry.  Psychiatric: He has a normal mood and affect.    ED Course  Procedures (including critical care time) Labs Review Labs Reviewed  CBC WITH DIFFERENTIAL - Abnormal; Notable for the following:    WBC 11.5 (*)    RBC 3.37 (*)    Hemoglobin 10.4 (*)    HCT 30.6 (*)    Neutrophils Relative % 81 (*)    Neutro Abs 9.3 (*)    Lymphocytes Relative 5 (*)    Lymphs Abs 0.5 (*)    Monocytes Relative 14 (*)    Monocytes Absolute 1.6 (*)    All other components within normal limits  COMPREHENSIVE METABOLIC PANEL  URINALYSIS, ROUTINE W REFLEX MICROSCOPIC  TROPONIN I  LIPASE, BLOOD  I-STAT CG4 LACTIC ACID, ED   Imaging Review Dg Chest 2 View  05/17/2013   CLINICAL DATA:  80 year old male with chest pain, shortness of breath congestion.  EXAM: CHEST  2 VIEW  COMPARISON:  02/08/2013  FINDINGS: Upper limits normal heart size identified.  Pulmonary vascular congestion is identified.  Right lower lobe airspace opacity is suspicious for pneumonia.  There is no evidence of pleural effusion or pneumothorax.  Surgical clips within the right axilla are again noted.  IMPRESSION: Right lower lobe airspace  disease/pneumonia -radiographic follow-up to resolution is recommended.  Pulmonary vascular congestion   Electronically Signed   By: Hassan Rowan M.D.   On: 05/17/2013 20:29    EKG Interpretation    Date/Time:  Thursday May 17 2013 19:33:44 EST Ventricular Rate:  79 PR Interval:  202 QRS Duration: 92 QT Interval:  347 QTC Calculation: 398 R Axis:   -40 Text Interpretation:  Sinus rhythm Left axis deviation Repol abnrm suggests ischemia, lateral leads ST elevation, consider inferior injury Sinus rhythm Left axis deviation ST elevation in Inferior leads  - borderline Abnormal ekg Confirmed by Carmin Muskrat  MD ([id number]) on 05/17/2013 7:45:20 PM           Pulse oximetry 91% room air abnormal   Initial x-ray demonstrates concern for pneumonia.  Patient has leukocytosis, and given the hypoxia, recent surgery, treatment for healthcare acquired pneumonia will be started. MDM   This elderly male, chronically a, presents with fatigue, weakness, and on exam his hypoxic, notably deconditioned. Patient's evaluation demonstrates concern for pneumonia with leukocytosis, x-ray with opacifications.  Patient was started on therapy for healthcare acquired pneumonia, admitted for further evaluation and management.   Carmin Muskrat, MD 05/17/13 2121

## 2013-05-17 NOTE — ED Notes (Addendum)
Per EMS report, pt from home: Family reports pt is increasing weakness since his surgical procedure on 2/10.  Pt was discharged on 2/15. Pt does not want to get out of bed. Pt has a feeding tube and is NPO.  EMS noted that pt's lung sounds clear but EMS did suction some green mucus from the back of his mouth. Pt does not have control of bladder or bowels. Pt a/o x 4. Pt denies any pain.  Pt hx of throat cancer.

## 2013-05-17 NOTE — ED Notes (Signed)
Bed: WA20 Expected date:  Expected time:  Means of arrival:  Comments: EMS-FTT-weakness

## 2013-05-17 NOTE — H&P (Signed)
PCP:   Nyoka Cowden, MD   Chief Complaint:  Sob, cough  HPI: 80 yo male h/o cancer at base of tongue over 2 years ago s/p chemo/radiation/surg with gtube since then was at [facility] in the last month to undergo an esophageal dilation.  He has chronic scarring/stricture formation postradiation.  During that procedure, his esophagus was perforated, sounds like (history from his daughter) he developed a pneumothorax requiring chest tube on left, a lot of subcutaneous air in his face/neck, he underwent emergent surgical repair of his esophageal perforation.  She thinks they patched it.  After the surgery, he was in ICU intubated for several days.  He was discharged about 2 weeks ago to home with no HH/or PT set up.  Prior to this hospitalization, he was doing well, walking well with walker, still communicative with normal cognitive function.  Since his discharge to home 2 weeks ago, he has barely been able to get out of bed.  He eats nothing but mouth for last 2 years, has been tolerating his tube feeds however.  No fevers.  No n/v/d.  He has been coughing a lot for the last several days.  His wife, who is in the next room in the ED being seen for a cough also, has been sick with URI symptoms.  Daughter is with dad right now.  She states that since he left [facility], he has been more confused.  No other focal neuro deficits.  Pt is pleasantly confused, he denies any pain, appears comfortable.  He says he is waiting for superman.  Pt coughs a lot during my interview.  Review of Systems:  Unobtainable from pt  Past Medical History: Past Medical History  Diagnosis Date  . Blood transfusion     2006 had 4 units  . Arthritis     knees,back,shoulders  . Anxiety   . Right ureteral stone S/P URETERAL STENT 01-24-11  AND ESWL  03-01-11  . History of acute renal failure 01-23-11    DUE TO HYDRONEPHROSIS AND RIGHT STONE  . Normal cardiac stress test 09-25-2007  . Glaucoma   . Cataract  immature   . Diverticulosis of colon   . History of GI diverticular bleed   . Hyperlipemia   . Impaired hearing NOT WEARING HIS BILATERL AIDS  . Urgency of urination     DUE TO URETERAL STONE AND STENT  . Frequency of urination   . Insomnia   . Anemia   . Chronic kidney disease     kidney stones  . GERD (gastroesophageal reflux disease)   . Hernia     inguinal  . Headache(784.0)     migraines  . Trigeminal neuralgia   . Hypertension   . History of radiation therapy 06/21/11-08/04/11    tongue/h/n 69.96 gy in 33 fxs  . History of chemotherapy 07/26/11    taxol/carbo completed/stopped 07/26/11  . 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 G tube feedings   . History of radiation therapy 06/21/11-08/04/11    base of tongue  . Male breast cancer 1988    S/P RIGHT MASTECTOMY W/ NODE DISSECTION AND CHEMO  . 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 Skin cancer 2002    melanoma- back  . Colon polyps   . Diverticulosis    Past Surgical History  Procedure Laterality Date  . Cystoscopy w/ ureteral stent placement  01/24/2011    Procedure: CYSTOSCOPY WITH RETROGRADE PYELOGRAM/URETERAL STENT PLACEMENT;  Surgeon: Fredricka Bonine, MD;  Location: [facility];  Service: Urology;  Laterality: Right;  . Left leg surg for fx  AGE 34    patient states left thigh  . Craniectomy suboccipital for exploration / decompression cranial nerves  2003    5TH CRANIAL NERVE DECOMPRESSION  . Simple mastectomy  1988    MALE--- RIGHT BREAST W/ NODE DISSECTION  . Melanoma excision  10 YRS AGO    BACK AREA  . Extracorporeal shock wave lithotripsy  03-01-11    RIGHT  . Ureteroscopy  03/26/2011    Procedure: URETEROSCOPY;  Surgeon: Fredricka Bonine, MD;  Location: [facility];  Service: Urology;  Laterality: Right;  RIGHT URETEROSCOPY, HOLMIUM LASER AND STENT   . Examination under anesthesia  03/26/2011    Procedure: EXAM UNDER ANESTHESIA;  Surgeon: Fredricka Bonine, MD;  [facility]  Service: Urology;  Laterality: N/A;  . Lymph node biopsy  04/21/2011    Procedure: LYMPH NODE BIOPSY;  Surgeon: Beckie Salts, MD;  Location: [place];  Service: ENT;  Laterality: Right;  . Gastrostomy tube placement    . Esophagoscopy with dilitation N/A 02/14/2013    Procedure: ESOPHAGOSCOPY WITH DILITATION;  Surgeon: Izora Gala, MD;  Location: [place];  Service: ENT;  Laterality: N/A;  Attempted  . Direct laryngoscopy  02/14/2013    Procedure: DIRECT LARYNGOSCOPY;  Surgeon: Izora Gala, MD;  Location: [place];  Service: ENT;;    Medications: Prior to Admission medications   Medication Sig Start Date End Date Taking? Authorizing Provider  amLODipine (NORVASC) 5 MG tablet Take 0.5 tablets (2.5 mg total) by mouth daily. 03/23/13  Yes Marletta Lor, MD  carbamazepine (TEGRETOL) 200 MG tablet 200 mg by PEG Tube route 3 (three) times daily.    Yes Historical Provider, MD  Doxylamine Succinate, Sleep, (SLEEP AID PO) 2 capsules by PEG Tube route at bedtime as needed (sleep).   Yes Historical Provider, MD  LORazepam (ATIVAN) 0.5 MG tablet 0.5 mg by PEG Tube route 3 (three) times daily as needed for anxiety. ANXIETY    Yes Garner Gavel, MD  Nutritional Supplements (FEEDING SUPPLEMENT, OSMOLITE 1.5 CAL,) LIQD Increase Osmolite 1.5 via feeding tube to 1.5 cans QID with 60 cc water before and after each bolus TF.  Add 240 cc free water TID between feedings as tolerated. 07/05/11  Yes Nobie Putnam, MD  omeprazole (PRILOSEC) 40 MG capsule 40 mg by Feeding Tube route daily.   Yes Historical Provider, MD  tamsulosin (FLOMAX) 0.4 MG CAPS capsule Take 0.4 mg by mouth daily.   Yes Historical Provider, MD  traMADol (ULTRAM) 50 MG tablet Take 1 tablet (50 mg total) by mouth every 8 (eight) hours as needed. 03/23/13  Yes Marletta Lor, MD  Travoprost, BAK Free, (TRAVATAMN) 0.004 % SOLN ophthalmic solution Place 1 drop into both eyes at bedtime. 01/27/11  Yes Monika Salk, MD  zolpidem  (AMBIEN) 5 MG tablet Take 1 tablet (5 mg total) by mouth at bedtime as needed for sleep. 03/23/13  Yes Marletta Lor, MD    Allergies:   Allergies  Allergen Reactions  . Hydromorphone Swelling and Other (See Comments)    phlebitis  . Morphine And Related Other (See Comments)    Phlebitis, makes pt out of it   . Codeine Other (See Comments)    Dizzy, mental status changes    Social History:  reports that he quit smoking about 32 years  ago. His smoking use included Cigarettes and Cigars. He has a 7.5 pack-year smoking history. He has never used smokeless tobacco. He reports that he does not drink alcohol or use illicit drugs.  Family History: Family History  Problem Relation Age of Onset  . Heart disease Mother   . Heart disease Father   . Cancer Sister     "perineum"  . Stomach cancer Paternal Grandmother     Physical Exam: Filed Vitals:   05/17/13 1929  BP: 164/63  Pulse: 84  Temp: 99.7 F (37.6 C)  TempSrc: Rectal  Resp: 19  SpO2: 91%   General appearance: alert, cooperative and no distress  Chronically ill appearing Head: Normocephalic, without obvious abnormality, atraumatic Eyes: negative Nose: Nares normal. Septum midline. Mucosa normal. No drainage or sinus tenderness. Neck: no JVD and supple, symmetrical, trachea midline Lungs: clear to auscultation bilaterally Heart: regular rate and rhythm, S1, S2 normal, no murmur, click, rub or gallop Abdomen: soft, non-tender; bowel sounds normal; no masses,  no organomegaly gtrube c/d/i Extremities: extremities normal, atraumatic, no cyanosis or edema Pulses: 2+ and symmetric Skin: Skin color, texture, turgor normal. No rashes or lesions Neurologic: Grossly normal    Labs on Admission:   Recent Labs  05/17/13 2036  NA 136*  K 4.6  CL 99  CO2 24  GLUCOSE 150*  BUN 27*  CREATININE 1.10  CALCIUM 9.1    Recent Labs  05/17/13 2036  AST 70*  ALT 79*  ALKPHOS 212*  BILITOT 0.6  PROT 6.5  ALBUMIN  2.4*    Recent Labs  05/17/13 2036  LIPASE 65*    Recent Labs  05/17/13 2036  WBC 11.5*  NEUTROABS 9.3*  HGB 10.4*  HCT 30.6*  MCV 90.8  PLT 399    Recent Labs  05/17/13 2036  TROPONINI <0.30   Radiological Exams on Admission: Dg Chest 2 View  05/17/2013   CLINICAL DATA:  80 year old male with chest pain, shortness of breath congestion.  EXAM: CHEST  2 VIEW  COMPARISON:  02/08/2013  FINDINGS: Upper limits normal heart size identified.  Pulmonary vascular congestion is identified.  Right lower lobe airspace opacity is suspicious for pneumonia.  There is no evidence of pleural effusion or pneumothorax.  Surgical clips within the right axilla are again noted.  IMPRESSION: Right lower lobe airspace disease/pneumonia -radiographic follow-up to resolution is recommended.  Pulmonary vascular congestion   Electronically Signed   By: Hassan Rowan M.D.   On: 05/17/2013 20:29    Assessment/Plan  80 yo male with recent complications from esophageal dilation at [facility] comes in with sob, cough, deconditioning  Principal Problem:   HCAP (healthcare-associated pneumonia)  tx for hcap due to recent issues at [place] on vanc/cefepime.  pna pathway.  vss.  Active Problems:   HYPERLIPIDEMIA  stable   Anxiety state, unspecified  stable   HYPERTENSION  stable   Cancer of base of tongue- years ago in remission   SOB (shortness of breath) from pna vss   FTT (failure to thrive) in adult- due to recent major illness, needs rehab/PT  Sounds like pt has had significant deconditioning from recent illness at [facility].  Think he would benefit from short term rehab, or the very least arranging some PT/HH for family at home.  Ordered PT eval.  Daughter not sure if he is DNR or not, will need to discuss with wife when she is available (being evaluated right now by ED for cough).  Presumptive FULL CODE  for now.  Leanna Hamid A 05/17/2013, 9:33 PM

## 2013-05-17 NOTE — Progress Notes (Signed)
ANTIBIOTIC CONSULT NOTE - INITIAL  Pharmacy Consult for:  Vancomycin Indication:  Pneumonia (HCAP)  Allergies  Allergen Reactions  . Hydromorphone Swelling and Other (See Comments)    phlebitis  . Morphine And Related Other (See Comments)    Phlebitis, makes pt out of it   . Codeine Other (See Comments)    Dizzy, mental status changes    Patient Measurements: Height: 5\' 10"  (177.8 cm) Weight: 155 lb (70.308 kg) IBW/kg (Calculated) : 73   Vital Signs: Temp: 99.7 F (37.6 C) (02/26 1929) Temp src: Rectal (02/26 1929) BP: 164/63 mmHg (02/26 1929) Pulse Rate: 84 (02/26 1929)  Labs:  Recent Labs  05/17/13 2036  WBC 11.5*  HGB 10.4*  PLT 399  CREATININE 1.10   Estimated Creatinine Clearance: 50.6 ml/min (by C-G formula based on Cr of 1.1).    Medical History: Past Medical History  Diagnosis Date  . Blood transfusion     2006 had 4 units  . Arthritis     knees,back,shoulders  . Anxiety   . Right ureteral stone S/P URETERAL STENT 01-24-11  AND ESWL  03-01-11  . History of acute renal failure 01-23-11    DUE TO HYDRONEPHROSIS AND RIGHT STONE  . Normal cardiac stress test 09-25-2007  . Glaucoma   . Cataract immature   . Diverticulosis of colon   . History of GI diverticular bleed   . Hyperlipemia   . Impaired hearing NOT WEARING HIS BILATERL AIDS  . Urgency of urination     DUE TO URETERAL STONE AND STENT  . Frequency of urination   . Insomnia   . Anemia   . Chronic kidney disease     kidney stones  . GERD (gastroesophageal reflux disease)   . Hernia     inguinal  . Headache(784.0)     migraines  . Trigeminal neuralgia   . Hypertension   . History of radiation therapy 06/21/11-08/04/11    tongue/h/n 69.96 gy in 33 fxs  . History of chemotherapy 07/26/11    taxol/carbo completed/stopped 07/26/11  . 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 G tube feedings   . History of radiation therapy 06/21/11-08/04/11    base of tongue  . Male breast cancer 1988    S/P RIGHT  MASTECTOMY W/ NODE DISSECTION AND CHEMO  . 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 Skin cancer 2002    melanoma- back  . Colon polyps   . Diverticulosis     Medications:  Pending  Assessment:  Asked to assist with Vancomycin therapy for this 80 year-old male with complaint of shortness of breath and cough, and presumed healthcare-associated pneumonia.  Cefepime has also been ordered.  History of squamous cell carcinoma of base of tongue; receives feedings via G-tube.    Hospital stay at [facility] in the last month, which included intubation and several days in the ICU.  Goals of Therapy:   Vancomycin trough levels 15-20 mcg/ml  Antibiotic doses appropriate for renal function  Eradication of infection   Plan:   Vancomycin 1250 mg IV every 24 hours  Vancomycin levels as needed to guide dosing  Close monitoring of renal function  [employer] R.Ph. 05/17/2013 10:30 PM

## 2013-05-17 NOTE — Progress Notes (Signed)
   CARE MANAGEMENT ED NOTE 05/17/2013  Patient:  Jose [name], Jose [name]   Account Number:  [id number]  Date Initiated:  05/17/2013  Documentation initiated by:  Livia Snellen  Subjective/Objective Assessment:   Patient presents to Ed with shortness of breath and weakness     Subjective/Objective Assessment Detail:   Patient with pmhx of skin cancer and tongue cancer.     Action/Plan:   Action/Plan Detail:   Patient to be admitted   Anticipated DC Date:       Status Recommendation to Physician:   Result of Recommendation:    Other ED Services  Consult Working [place]  Other  CM consult    Choice offered to / List presented to:            Status of service:  Completed, signed off  ED Comments:   ED Comments Detail:  EDCM spoke to patient's daughter Jose [name] at bedside.  Jose [name]'s phone number [phone].  Jose [name] also provided another family member phone number Jose [name] [phone] (c) and (h) [phone].  Patient lives at home with his wife who is also in [facility] ED for chest congestion.  Patient's daughter Jose [name] lives next door to patient.  Patient has a rolling walker and a cane at home.  Patient does not have any other equipment at home.  EDCM provided patient's daughter with a list of home health agencies in [facility].  EDCM explained that with home health the patient may receive a visiting RN, PT, OT, aide and Education officer, museum.  EDCM also provided patient's daughter with a list of private duty nursing services as well and explained that it would be an out of pocket expense for the patient.  EDCM placed social work consult in case of need for SNF placement.  [name]'s daughter thankful for services.  No further EDCM needs at this time.

## 2013-05-18 LAB — CBC
HEMATOCRIT: 32.6 % — AB (ref 39.0–52.0)
Hemoglobin: 11 g/dL — ABNORMAL LOW (ref 13.0–17.0)
MCH: 30.9 pg (ref 26.0–34.0)
MCHC: 33.7 g/dL (ref 30.0–36.0)
MCV: 91.6 fL (ref 78.0–100.0)
PLATELETS: 396 10*3/uL (ref 150–400)
RBC: 3.56 MIL/uL — ABNORMAL LOW (ref 4.22–5.81)
RDW: 13 % (ref 11.5–15.5)
WBC: 11.1 10*3/uL — ABNORMAL HIGH (ref 4.0–10.5)

## 2013-05-18 LAB — EXPECTORATED SPUTUM ASSESSMENT W GRAM STAIN, RFLX TO RESP C

## 2013-05-18 LAB — EXPECTORATED SPUTUM ASSESSMENT W REFEX TO RESP CULTURE

## 2013-05-18 LAB — CREATININE, SERUM
Creatinine, Ser: 1.07 mg/dL (ref 0.50–1.35)
GFR calc Af Amer: 72 mL/min — ABNORMAL LOW (ref >90)
GFR calc non Af Amer: 62 mL/min — ABNORMAL LOW (ref >90)

## 2013-05-18 LAB — INFLUENZA PANEL BY PCR (TYPE A & B)
H1N1FLUPCR: NOT DETECTED
INFLAPCR: NEGATIVE
INFLBPCR: NEGATIVE

## 2013-05-18 LAB — STREP PNEUMONIAE URINARY ANTIGEN: Strep Pneumo Urinary Antigen: NEGATIVE

## 2013-05-18 MED ORDER — SODIUM CHLORIDE 0.9 % IV SOLN
INTRAVENOUS | Status: DC
  Administered 2013-05-18: via INTRAVENOUS

## 2013-05-18 MED ORDER — OSMOLITE 1.5 CAL PO LIQD
355.0000 mL | Freq: Four times a day (QID) | ORAL | Status: DC
  Administered 2013-05-18 – 2013-05-21 (×12): 355 mL
  Filled 2013-05-18 (×15): qty 474

## 2013-05-18 MED ORDER — TAMSULOSIN HCL 0.4 MG PO CAPS
0.4000 mg | ORAL_CAPSULE | Freq: Every day | ORAL | Status: DC
  Administered 2013-05-18 – 2013-05-21 (×4): 0.4 mg via ORAL
  Filled 2013-05-18 (×4): qty 1

## 2013-05-18 MED ORDER — SODIUM CHLORIDE 0.9 % IJ SOLN: 3.0000 mL | INTRAMUSCULAR | Status: DC | PRN

## 2013-05-18 MED ORDER — PANTOPRAZOLE SODIUM 40 MG PO TBEC
40.0000 mg | DELAYED_RELEASE_TABLET | Freq: Every day | ORAL | Status: DC
  Administered 2013-05-18 – 2013-05-21 (×4): 40 mg via ORAL
  Filled 2013-05-18 (×4): qty 1

## 2013-05-18 MED ORDER — DEXTROSE 5 % IV SOLN
1.0000 g | Freq: Three times a day (TID) | INTRAVENOUS | Status: DC
  Administered 2013-05-18 – 2013-05-20 (×9): 1 g via INTRAVENOUS
  Filled 2013-05-18 (×11): qty 1

## 2013-05-18 MED ORDER — ZOLPIDEM TARTRATE 5 MG PO TABS: 5.0000 mg | ORAL_TABLET | Freq: Every evening | ORAL | Status: DC | PRN

## 2013-05-18 MED ORDER — VANCOMYCIN HCL IN DEXTROSE 750-5 MG/150ML-% IV SOLN
750.0000 mg | Freq: Two times a day (BID) | INTRAVENOUS | Status: DC
  Administered 2013-05-18 – 2013-05-21 (×6): 750 mg via INTRAVENOUS
  Filled 2013-05-18 (×7): qty 150

## 2013-05-18 MED ORDER — SODIUM CHLORIDE 0.9 % IJ SOLN
3.0000 mL | Freq: Two times a day (BID) | INTRAMUSCULAR | Status: DC
  Administered 2013-05-18 – 2013-05-20 (×5): 3 mL via INTRAVENOUS
  Administered 2013-05-20: 21:00:00 via INTRAVENOUS

## 2013-05-18 MED ORDER — SODIUM CHLORIDE 0.9 % IV SOLN
250.0000 mL | INTRAVENOUS | Status: DC | PRN
  Administered 2013-05-20: 21:00:00 via INTRAVENOUS

## 2013-05-18 MED ORDER — LORAZEPAM 0.5 MG PO TABS
0.5000 mg | ORAL_TABLET | Freq: Three times a day (TID) | ORAL | Status: DC | PRN
  Administered 2013-05-18: 0.5 mg
  Filled 2013-05-18: qty 1

## 2013-05-18 MED ORDER — CARBAMAZEPINE 200 MG PO TABS
200.0000 mg | ORAL_TABLET | Freq: Three times a day (TID) | ORAL | Status: DC
  Administered 2013-05-18 – 2013-05-21 (×11): 200 mg
  Filled 2013-05-18 (×12): qty 1

## 2013-05-18 MED ORDER — ENOXAPARIN SODIUM 30 MG/0.3ML ~~LOC~~ SOLN
30.0000 mg | SUBCUTANEOUS | Status: DC
  Administered 2013-05-18 – 2013-05-21 (×4): 30 mg via SUBCUTANEOUS
  Filled 2013-05-18 (×4): qty 0.3

## 2013-05-18 MED ORDER — AMLODIPINE BESYLATE 2.5 MG PO TABS
2.5000 mg | ORAL_TABLET | Freq: Every day | ORAL | Status: DC
  Administered 2013-05-18 – 2013-05-21 (×4): 2.5 mg via ORAL
  Filled 2013-05-18 (×4): qty 1

## 2013-05-18 NOTE — Progress Notes (Signed)
ANTIBIOTIC CONSULT NOTE - FOLLOW-UP  Pharmacy Consult for:  Vancomycin, Renal dose adjustment Indication:  Pneumonia (HCAP)  Allergies  Allergen Reactions  . Hydromorphone Swelling and Other (See Comments)    phlebitis  . Morphine And Related Other (See Comments)    Phlebitis, makes pt out of it   . Codeine Other (See Comments)    Dizzy, mental status changes    Patient Measurements: Height: 5\' 10"  (177.8 cm) Weight: 158 lb 4.6 oz (71.8 kg) IBW/kg (Calculated) : 73   Vital Signs: Temp: 98.6 F (37 C) (02/27 0300) Temp src: Oral (02/27 0300) BP: 159/54 mmHg (02/27 0300) Pulse Rate: 79 (02/27 0300)  Labs:  Recent Labs  05/17/13 2036 05/18/13 0416  WBC 11.5* 11.1*  HGB 10.4* 11.0*  PLT 399 396  CREATININE 1.10 1.07   Estimated Creatinine Clearance: 53.1 ml/min (by C-G formula based on Cr of 1.07).    Medical History: Past Medical History  Diagnosis Date  . Blood transfusion     2006 had 4 units  . Arthritis     knees,back,shoulders  . Anxiety   . Right ureteral stone S/P URETERAL STENT 01-24-11  AND ESWL  03-01-11  . History of acute renal failure 01-23-11    DUE TO HYDRONEPHROSIS AND RIGHT STONE  . Normal cardiac stress test 09-25-2007  . Glaucoma   . Cataract immature   . Diverticulosis of colon   . History of GI diverticular bleed   . Hyperlipemia   . Impaired hearing NOT WEARING HIS BILATERL AIDS  . Urgency of urination     DUE TO URETERAL STONE AND STENT  . Frequency of urination   . Insomnia   . Anemia   . Chronic kidney disease     kidney stones  . GERD (gastroesophageal reflux disease)   . Hernia     inguinal  . Headache(784.0)     migraines  . Trigeminal neuralgia   . Hypertension   . History of radiation therapy 06/21/11-08/04/11    tongue/h/n 69.96 gy in 33 fxs  . History of chemotherapy 07/26/11    taxol/carbo completed/stopped 07/26/11  . 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 G tube feedings   . History of radiation therapy 06/21/11-08/04/11     base of tongue  . Male breast cancer 1988    S/P RIGHT MASTECTOMY W/ NODE DISSECTION AND CHEMO  . 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 Skin cancer 2002    melanoma- back  . Colon polyps   . Diverticulosis     Medications:  Pending  Assessment:  Asked to assist with Vancomycin therapy for this 80 year-old male with complaint of shortness of breath and cough, and presumed healthcare-associated pneumonia.  Cefepime has also been ordered.  History of squamous cell carcinoma of base of tongue; receives feedings via G-tube.    Hospital stay at [facility] in the last month, which included intubation and several days in the ICU.  D2 Antibiotics 2/26 >> Vancomycin >> 2/26 >> Cefepime (MD) >>   Tmax: Afeb WBCs: 11.1 Renal: SCr 1.07 CrCl 53 CG / 52 N  2/26 blood x 2: Collected 2/27 blood x 2: Collected 2/26 Legionella antigen, urine: pending 2/26 Strep pneumoniae urinary antigen: Neg 2/26 sputum:  Goals of Therapy:   Vancomycin trough levels 15-20 mcg/ml  Antibiotic doses appropriate for renal function  Eradication of infection   Plan:   Change to vancomycin 750mg  IV q12h  Vancomycin levels as  needed to guide dosing  Close monitoring of renal function  Ralene Bathe, PharmD, BCPS 05/18/2013, 11:11 AM  Pager: [phone]

## 2013-05-18 NOTE — Progress Notes (Signed)
Patient became very anxious and also agitated. He was looking for his glasses and could not find them. Patient was given oral suctioning , oral care,pulled up in bed and repositioned, as well as some anti-anxiety medication.  Patient eventually calmed down again and began to rest around 0510. RN reassured patient that he was being well looked after by staff in the hospital.  Will continue to monitor the patient.

## 2013-05-18 NOTE — Progress Notes (Signed)
Clinical Social Work Department CLINICAL SOCIAL WORK PLACEMENT NOTE 05/18/2013  Patient:  Jose [name], Jose [name]  Account Number:  [id number] Admit date:  05/17/2013  Clinical Social Worker:  Renold Genta  Date/time:  05/18/2013 03:21 PM  Clinical Social Work is seeking post-discharge placement for this patient at the following level of care:   SKILLED NURSING   (*CSW will update this form in Epic as items are completed)   05/18/2013  Patient/family provided with [place] Department of Clinical Social Work's list of facilities offering this level of care within the geographic area requested by the patient (or if unable, by the patient's family).  05/18/2013  Patient/family informed of their freedom to choose among providers that offer the needed level of care, that participate in Medicare, Medicaid or managed care program needed by the patient, have an available bed and are willing to accept the patient.  05/18/2013  Patient/family informed of [facility]' ownership interest in [facility], as well as of the fact that they are under no obligation to receive care at this facility.  PASARR submitted to EDS on 05/18/2013 PASARR number received from EDS on 05/18/2013  FL2 transmitted to all facilities in geographic area requested by pt/family on  05/18/2013 FL2 transmitted to all facilities within larger geographic area on   Patient informed that his/her managed care company has contracts with or will negotiate with  certain facilities, including the following:     Patient/family informed of bed offers received:  05/18/2013 Patient chooses bed at  Physician recommends and patient chooses bed at    Patient to be transferred to  on   Patient to be transferred to facility by   The following physician request were entered in Epic:   Additional Comments:   Raynaldo Opitz, [place] Social Worker cell #: [phone]

## 2013-05-18 NOTE — Progress Notes (Signed)
Clinical Social Work Department BRIEF PSYCHOSOCIAL ASSESSMENT 05/18/2013  Patient:  Jose [name], Jose [name]     Account Number:  [id number]     Admit date:  05/17/2013  Clinical Social Worker:  Renold Genta  Date/Time:  05/18/2013 03:13 PM  Referred by:  Physician  Date Referred:  05/18/2013 Referred for  SNF Placement   Other Referral:   Interview type:  Patient Other interview type:   and wife via phone    PSYCHOSOCIAL DATA Living Status:  WIFE Admitted from facility:   Level of care:   Primary support name:  Machael Raine (wife) h#: [phone] c#: [phone] Primary support relationship to patient:  SPOUSE Degree of support available:   good    CURRENT CONCERNS Current Concerns  Post-Acute Placement   Other Concerns:    SOCIAL WORK ASSESSMENT / PLAN CSW received consult for SNF placement, note PT recommended SNF at discharge.   Assessment/plan status:  Information/Referral to [employer] Other assessment/ plan:   Information/referral to community resources:   CSW completed FL2 and faxed information out to [facility] SNFs - left SNF list and bed offers at bedside.    PATIENT'S/FAMILY'S RESPONSE TO PLAN OF CARE: Patient's wife states that she has been sick with bronchitis herself and would not be able to care for patient in his current weakened condition. Wife states that she had started to call around to find a nurse that could care for him for a few hours. Patient was recently at [place], [place] Worker cell #: [phone]

## 2013-05-18 NOTE — Progress Notes (Signed)
INITIAL NUTRITION ASSESSMENT  DOCUMENTATION CODES Per approved criteria  -Not Applicable   INTERVENTION: - Initiate home tube feeding regimen via G tube, Osmolite 1.5, 1.5 cans QID, to provide 2133 kcal, 89 g protein, ,1084 ml free water.  - RD to monitor tolerance.   NUTRITION DIAGNOSIS: Inadequate oral intake related to inability to eat as evidenced by NPO status.  Goal: Patient will meet >/=90% of estimated nutrition needs with enteral nutrition   Monitor:  TF advancement and tolerance, weight, labs  Reason for Assessment: Malnutrition screening tool   80 y.o. male  Admitting Dx: HCAP (healthcare-associated pneumonia)  ASSESSMENT: Patient with history of cancer at the base of the tongue 2 years ago, s/p chemo, radiation, and surgery. Admitted to [facility] last month for esophageal dilation. Esophagus was perforated during procedure. Current admission for HCAP.   Patient with dysphagia/esophageal stricture, unable to tolerate anything by mouth for the last 2 years, but has a G tube in place, tolerating tube feeds. His weight fluctuates, but has been mostly stable for the last year.   Home tube feed regimen is Osmolite 1.5, 1.5 cans QID, flushed with 60 ml free water before and after feedings, and an additional 240 ml free water TID. This provides 2133 kcal, 89 g protein, 2284 ml free water. Per MD, ok to start tube feeding per home regimen.   Height: Ht Readings from Last 1 Encounters:  05/18/13 5\' 10"  (1.778 m)    Weight: Wt Readings from Last 1 Encounters:  05/18/13 158 lb 4.6 oz (71.8 kg)    Ideal Body Weight: 166 pounds  % Ideal Body Weight: 95%  Wt Readings from Last 10 Encounters:  05/18/13 158 lb 4.6 oz (71.8 kg)  03/23/13 162 lb (73.483 kg)  02/08/13 159 lb 4 oz (72.235 kg)  01/30/13 161 lb 6.4 oz (73.211 kg)  12/13/12 166 lb 14.4 oz (75.705 kg)  08/23/12 171 lb (77.565 kg)  06/07/12 170 lb 8 oz (77.338 kg)  02/24/12 162 lb (73.483 kg)  02/23/12  162 lb (73.483 kg)  01/12/12 163 lb 2 oz (73.993 kg)    Usual Body Weight: 165 pounds  % Usual Body Weight: 96%  BMI:  Body mass index is 22.71 kg/(m^2). Patient is normal weight.   Estimated Nutritional Needs: Kcal: 2000-2150 kcal Protein: 85-100 g Fluid: >2.1 L/day  Skin: Intact  Diet Order: NPO  EDUCATION NEEDS: -No education needs identified at this time   Intake/Output Summary (Last 24 hours) at 05/18/13 1351 Last data filed at 05/18/13 0900  Gross per 24 hour  Intake 321.67 ml  Output    250 ml  Net  71.67 ml    Last BM: PTA   Labs:   Recent Labs Lab 05/17/13 2036 05/18/13 0416  NA 136*  --   K 4.6  --   CL 99  --   CO2 24  --   BUN 27*  --   CREATININE 1.10 1.07  CALCIUM 9.1  --   GLUCOSE 150*  --     CBG (last 3)  No results found for this basename: GLUCAP,  in the last 72 hours  Scheduled Meds: . amLODipine  2.5 mg Oral Daily  . carbamazepine  200 mg Per Tube TID  . ceFEPime (MAXIPIME) IV  1 g Intravenous 3 times per day  . enoxaparin (LOVENOX) injection  30 mg Subcutaneous Q24H  . pantoprazole  40 mg Oral Daily  . sodium chloride  3 mL Intravenous Q12H  .  tamsulosin  0.4 mg Oral Daily  . vancomycin  750 mg Intravenous Q12H    Continuous Infusions:   Past Medical History  Diagnosis Date  . Blood transfusion     2006 had 4 units  . Arthritis     knees,back,shoulders  . Anxiety   . Right ureteral stone S/P URETERAL STENT 01-24-11  AND ESWL  03-01-11  . History of acute renal failure 01-23-11    DUE TO HYDRONEPHROSIS AND RIGHT STONE  . Normal cardiac stress test 09-25-2007  . Glaucoma   . Cataract immature   . Diverticulosis of colon   . History of GI diverticular bleed   . Hyperlipemia   . Impaired hearing NOT WEARING HIS BILATERL AIDS  . Urgency of urination     DUE TO URETERAL STONE AND STENT  . Frequency of urination   . Insomnia   . Anemia   . Chronic kidney disease     kidney stones  . GERD (gastroesophageal reflux  disease)   . Hernia     inguinal  . Headache(784.0)     migraines  . Trigeminal neuralgia   . Hypertension   . History of radiation therapy 06/21/11-08/04/11    tongue/h/n 69.96 gy in 33 fxs  . History of chemotherapy 07/26/11    taxol/carbo completed/stopped 07/26/11  . 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 G tube feedings   . History of radiation therapy 06/21/11-08/04/11    base of tongue  . Male breast cancer 1988    S/P RIGHT MASTECTOMY W/ NODE DISSECTION AND CHEMO  . 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 Skin cancer 2002    melanoma- back  . Colon polyps   . Diverticulosis     Past Surgical History  Procedure Laterality Date  . Cystoscopy w/ ureteral stent placement  01/24/2011    Procedure: CYSTOSCOPY WITH RETROGRADE PYELOGRAM/URETERAL STENT PLACEMENT;  Surgeon: Fredricka Bonine, MD;  Location: [facility];  Service: Urology;  Laterality: Right;  . Left leg surg for fx  AGE 70    patient states left thigh  . Craniectomy suboccipital for exploration / decompression cranial nerves  2003    5TH CRANIAL NERVE DECOMPRESSION  . Simple mastectomy  1988    MALE--- RIGHT BREAST W/ NODE DISSECTION  . Melanoma excision  10 YRS AGO    BACK AREA  . Extracorporeal shock wave lithotripsy  03-01-11    RIGHT  . Ureteroscopy  03/26/2011    Procedure: URETEROSCOPY;  Surgeon: Fredricka Bonine, MD;  Location: [facility];  Service: Urology;  Laterality: Right;  RIGHT URETEROSCOPY, HOLMIUM LASER AND STENT   . Examination under anesthesia  03/26/2011    Procedure: EXAM UNDER ANESTHESIA;  Surgeon: Fredricka Bonine, MD;  Location: [facility];  Service: Urology;  Laterality: N/A;  . Lymph node biopsy  04/21/2011    Procedure: LYMPH NODE BIOPSY;  Surgeon: Beckie Salts, MD;  Location: [place];  Service: ENT;  Laterality: Right;  . Gastrostomy tube placement    . Esophagoscopy with dilitation N/A 02/14/2013    Procedure: ESOPHAGOSCOPY WITH DILITATION;   Surgeon: Izora Gala, MD;  Location: [place];  Service: ENT;  Laterality: N/A;  Attempted  . Direct laryngoscopy  02/14/2013    Procedure: DIRECT LARYNGOSCOPY;  Surgeon: Izora Gala, MD;  Location: [place];  Service: ENT;;  . Esophageal dilation  05/01/13    tread method esophageal dilation - Dr. Harrell Gave  [place]  . Diagnostic laproscopy  05/03/13    Dr. Margarette Canada at [place], RD, [place] Pager #: [phone] After-Hours Pager #: [phone]

## 2013-05-18 NOTE — Progress Notes (Signed)
PROGRESS NOTE  Jose [name] MEQ:[id number] DOB: 09-05-1927 DOA: 05/17/2013 PCP: Nyoka Cowden, MD  Assessment/Plan: HCAP - continue Vancomycin and Cefpime for now.  - check influenza panel this morning HLD Anxiety - ativan as needed HTN - continue norvasc FTT - PEG tube feeding. Nutrition consult.  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 Complicated by dysphagia now with a PEG tube. GERD - Protonix per tube. S/p esophageal dilation at [facility] c/b perforation with pneumothorax s/p repair - CXR here without evidence of pneumothorax.  Acute encephalopathy - multifactorial, recent hospitalization with ICU stay, current infectious process.   Diet: NPO Fluids: KVO DVT Prophylaxis: Lovenox  Code Status: Full Family Communication: none this morning  Disposition Plan: inpatient  Consultants:  none  Procedures:  none   Antibiotics  Anti-infectives   Start     Dose/Rate Route Frequency Ordered Stop   05/18/13 2200  vancomycin (VANCOCIN) IVPB 1000 mg/200 mL premix  Status:  Discontinued     1,000 mg 200 mL/hr over 60 Minutes Intravenous Every 24 hours 05/17/13 2149 05/17/13 2206   05/18/13 2200  vancomycin (VANCOCIN) 1,250 mg in sodium chloride 0.9 % 250 mL IVPB     1,250 mg 166.7 mL/hr over 90 Minutes Intravenous Every 24 hours 05/17/13 2217     05/18/13 0600  ceFEPIme (MAXIPIME) 1 g in dextrose 5 % 50 mL IVPB     1 g 100 mL/hr over 30 Minutes Intravenous 3 times per day 05/18/13 0011 05/26/13 0559   05/17/13 2200  vancomycin (VANCOCIN) 1,250 mg in sodium chloride 0.9 % 250 mL IVPB     1,250 mg 166.7 mL/hr over 90 Minutes Intravenous  Once 05/17/13 2148 05/17/13 2356   05/17/13 2130  ceFEPIme (MAXIPIME) 1 g in dextrose 5 % 50 mL IVPB     1 g 100 mL/hr over 30 Minutes Intravenous  Once 05/17/13 2117 05/17/13 2226     Antibiotics Given (last 72 hours)   Date/Time Action Medication Dose  Rate   05/18/13 0504 Given   ceFEPIme (MAXIPIME) 1 g in dextrose 5 % 50 mL IVPB 1 g 100 mL/hr      HPI/Subjective: - crying, feeling "very emotional"  Objective: Filed Vitals:   05/17/13 2258 05/17/13 2345 05/18/13 0016 05/18/13 0300  BP: 170/85 134/74 134/74 159/54  Pulse:  76 76 79  Temp: 98.3 F (36.8 C) 98 F (36.7 C) 98 F (36.7 C) 98.6 F (37 C)  TempSrc: Oral Oral Oral Oral  Resp: 18 20 19 19   Height:   5\' 10"  (1.778 m)   Weight:   71.8 kg (158 lb 4.6 oz)   SpO2: 98% 94% 96% 96%    Intake/Output Summary (Last 24 hours) at 05/18/13 2979 Last data filed at 05/18/13 0534  Gross per 24 hour  Intake 321.67 ml  Output    250 ml  Net  71.67 ml   Filed Weights   05/17/13 2143 05/18/13 0016  Weight: 70.308 kg (155 lb) 71.8 kg (158 lb 4.6 oz)    Exam:  General:  anxious  Cardiovascular: regular rate and rhythm, without MRG  Respiratory: good air movement, clear to auscultation throughout, no wheezing, ronchi or rales  Abdomen: soft, not tender to palpation, positive bowel sounds  MSK: no peripheral edema  Neuro: non focal  Data Reviewed: Basic Metabolic Panel:  Recent Labs Lab 05/17/13 2036 05/18/13 0416  NA 136*  --  K 4.6  --   CL 99  --   CO2 24  --   GLUCOSE 150*  --   BUN 27*  --   CREATININE 1.10 1.07  CALCIUM 9.1  --    Liver Function Tests:  Recent Labs Lab 05/17/13 2036  AST 70*  ALT 79*  ALKPHOS 212*  BILITOT 0.6  PROT 6.5  ALBUMIN 2.4*    Recent Labs Lab 05/17/13 2036  LIPASE 65*   CBC:  Recent Labs Lab 05/17/13 2036 05/18/13 0416  WBC 11.5* 11.1*  NEUTROABS 9.3*  --   HGB 10.4* 11.0*  HCT 30.6* 32.6*  MCV 90.8 91.6  PLT 399 396   Cardiac Enzymes:  Recent Labs Lab 05/17/13 2036  TROPONINI <0.30   Studies: Dg Chest 2 View  05/17/2013   CLINICAL DATA:  80 year old male with chest pain, shortness of breath congestion.  EXAM: CHEST  2 VIEW  COMPARISON:  02/08/2013  FINDINGS: Upper limits normal heart size  identified.  Pulmonary vascular congestion is identified.  Right lower lobe airspace opacity is suspicious for pneumonia.  There is no evidence of pleural effusion or pneumothorax.  Surgical clips within the right axilla are again noted.  IMPRESSION: Right lower lobe airspace disease/pneumonia -radiographic follow-up to resolution is recommended.  Pulmonary vascular congestion   Electronically Signed   By: Hassan Rowan M.D.   On: 05/17/2013 20:29    Scheduled Meds: . sodium chloride   Intravenous STAT  . amLODipine  2.5 mg Oral Daily  . carbamazepine  200 mg Per Tube TID  . ceFEPime (MAXIPIME) IV  1 g Intravenous 3 times per day  . enoxaparin (LOVENOX) injection  30 mg Subcutaneous Q24H  . sodium chloride  3 mL Intravenous Q12H  . tamsulosin  0.4 mg Oral Daily  . vancomycin  1,250 mg Intravenous Q24H   Continuous Infusions:   Principal Problem:   HCAP (healthcare-associated pneumonia) Active Problems:   HYPERLIPIDEMIA   Anxiety state, unspecified   HYPERTENSION   Cancer of base of tongue   SOB (shortness of breath)   FTT (failure to thrive) in adult   Time spent: 35  This note has been created with Surveyor, quantity. Any transcriptional errors are unintentional.   Marzetta Board, MD [facility] Pager [phone]. If 7 PM - 7 AM, please contact night-coverage at [email or web address], password [facility] 05/18/2013, 9:27 AM  LOS: 1 day

## 2013-05-18 NOTE — Evaluation (Signed)
Physical Therapy Evaluation Patient Details Name: Jose [name] MRN: [id number] DOB: 12-22-1927 Today's Date: 05/18/2013 Time: 4193-7902 PT Time Calculation (min): 32 min  PT Assessment / Plan / Recommendation History of Present Illness  yo male h/o cancer at base of tongue over 2 years ago s/p chemo/radiation/surg with gtube since then was at [facility] in the last month to undergo an esophageal dilation.  He has chronic scarring/stricture formation postradiation.  During that procedure, his esophagus was perforated, sounds like (history from his daughter) he developed a pneumothorax requiring chest tube on left, a lot of subcutaneous air in his face/neck, he underwent emergent surgical repair of his esophageal perforation.  She thinks they patched it.  After the surgery, he was in ICU intubated for several days.  He was discharged about 2 weeks ago to home with no HH/or PT set up.  Prior to this hospitalization, he was doing well, walking well with walker, still communicative with normal cognitive function.  Since his discharge to home 2 weeks ago, he has barely been able to get out of bed.  He eats nothing but mouth for last 2 years, has been tolerating his tube feeds however.  No fevers.  No n/v/d.  He has been coughing a lot for the last several days.  His wife, who is in the next room in the ED being seen for a cough also, has been sick with URI symptoms.  Daughter is with dad right now.  She states that since he left [place], he has been more confused.  No other focal neuro deficits.  Pt is pleasantly confused, he denies any pain, appears comfortable.  He says he is waiting for superman.  Pt coughs a lot during my interview  Clinical Impression  Pt  Mobilized to recliner, able  To stand and take a few steps. Pt will benefit from PT to address problems. Pt will benefit from SNF to return to more functional level.    PT Assessment  Patient needs continued PT services    Follow Up  Recommendations  SNF;Supervision/Assistance - 24 hour    Does the patient have the potential to tolerate intense rehabilitation      Barriers to Discharge Decreased caregiver support      Equipment Recommendations  None recommended by PT    Recommendations for Other Services     Frequency Min 3X/week    Precautions / Restrictions Precautions Precautions: Fall Precaution Comments: droplet, PEG, NPO   Pertinent Vitals/Pain No c/o      Mobility  Bed Mobility Overal bed mobility: Needs Assistance Bed Mobility: Supine to Sit Supine to sit: Mod assist General bed mobility comments: use of rail, extra timem needs assistance to get trunk upright with HOB to assist. Transfers Overall transfer level: Needs assistance Equipment used: Rolling walker (2 wheeled) Transfers: Sit to/from [facility] Sit to Stand: +2 physical assistance;Mod assist Stand pivot transfers: +2 physical assistance;Mod assist General transfer comment: assist to rise from Bed, cues for hand palcement, used walker to take several steps to recliner.    Exercises     PT Diagnosis: Difficulty walking;Generalized weakness  PT Problem List: Decreased strength;Decreased activity tolerance;Decreased balance;Decreased mobility;Decreased knowledge of precautions;Decreased safety awareness;Decreased knowledge of use of DME PT Treatment Interventions: DME instruction;Gait training;Functional mobility training;Therapeutic activities;Therapeutic exercise;Patient/family education     PT Goals(Current goals can be found in the care plan section) Acute Rehab PT Goals Patient Stated Goal: I need to get up, My wife is worn out taking  care of me PT Goal Formulation: With patient Time For Goal Achievement: 06/01/13 Potential to Achieve Goals: Good  Visit Information  Last PT Received On: 05/18/13 Assistance Needed: +2 History of Present Illness: yo male h/o 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  He has chronic scarring/stricture formation postradiation.  During that procedure, his esophagus was perforated, sounds like (history from his daughter) he developed a pneumothorax requiring chest tube on left, a lot of subcutaneous air in his face/neck, he underwent emergent surgical repair of his esophageal perforation.  She thinks they patched it.  After the surgery, he was in ICU intubated for several days.  He was discharged about 2 weeks ago to home with no HH/or PT set up.  Prior to this hospitalization, he was doing well, walking well with walker, still communicative with normal cognitive function.  Since his discharge to home 2 weeks ago, he has barely been able to get out of bed.  He eats nothing but mouth for last 2 years, has been tolerating his tube feeds however.  No fevers.  No n/v/d.  He has been coughing a lot for the last several days.  His wife, who is in the next room in the ED being seen for a cough also, has been sick with URI symptoms.  Daughter is with dad right now.  She states that since he left [place], he has been more confused.  No other focal neuro deficits.  Pt is pleasantly confused, he denies any pain, appears comfortable.  He says he is waiting for superman.  Pt coughs a lot during my interview       Prior [place] expects to be discharged to:: Skilled nursing facility Additional Comments: no family present, pt reports wife at home with "pneumonia" Prior Function Level of Independence: Needs assistance Comments: per chart, decline in function x 2 weeks    Cognition  Cognition Arousal/Alertness: Awake/alert Behavior During Therapy: WFL for tasks assessed/performed Overall Cognitive Status: Within Functional Limits for tasks assessed    Extremity/Trunk Assessment Upper Extremity Assessment Upper Extremity Assessment:  Generalized weakness RUE Deficits / Details: noted tremors LUE Deficits / Details: tremors present. Lower Extremity Assessment Lower Extremity Assessment: Generalized weakness   Balance Balance Overall balance assessment: Needs assistance Sitting-balance support: Bilateral upper extremity supported;Feet supported Sitting balance-Leahy Scale: Fair Standing balance support: Bilateral upper extremity supported;During functional activity Standing balance-Leahy Scale: Poor Standing balance comment: posterior lean/  End of Session PT - End of Session Activity Tolerance: Patient limited by fatigue Patient left: in chair;with call bell/phone within reach;with chair alarm set;with nursing/sitter in room Nurse Communication: Mobility status  GP     Claretha Cooper 05/18/2013, 11:36 [id number]

## 2013-05-18 NOTE — Consult Note (Addendum)
WOC wound consult note Reason for Consult: Consult requested to assess G-tube site.   Wound type: G-tube intact with slight erosion around insertion site and some hypergranulation.  Small area of red maceration surrounding tube from drainage. Drainage (amount, consistency, odor) Small amt light tan drainage, no odor Dressing procedure/placement/frequency: Pt states his wife uses some type of cream at home to help with this problem but does not remember the name.  Barrier cream applied to protect skin and repel moisture.  Split thickness gauze applied. No other topical treatment needed at this time. Pt does not want gauze taped to skin since it is painful to remove. Please re-consult if further assistance is needed.  Thank-you,  Julien Girt MSN, [place], [place], [place], [place]

## 2013-05-18 NOTE — Progress Notes (Signed)
RN requested an order for the [facility] nurse.Re; the skin surrounding the PEG. Skin has some erythema.

## 2013-05-19 LAB — BASIC METABOLIC PANEL
BUN: 23 mg/dL (ref 6–23)
CALCIUM: 8.6 mg/dL (ref 8.4–10.5)
CO2: 22 mEq/L (ref 19–32)
Chloride: 100 mEq/L (ref 96–112)
Creatinine, Ser: 1.01 mg/dL (ref 0.50–1.35)
GFR, EST AFRICAN AMERICAN: 77 mL/min — AB (ref >90)
GFR, EST NON AFRICAN AMERICAN: 67 mL/min — AB (ref >90)
Glucose, Bld: 127 mg/dL — ABNORMAL HIGH (ref 70–99)
POTASSIUM: 4.3 meq/L (ref 3.7–5.3)
SODIUM: 133 meq/L — AB (ref 137–147)

## 2013-05-19 LAB — CBC
HCT: 31.2 % — ABNORMAL LOW (ref 39.0–52.0)
HEMOGLOBIN: 10.6 g/dL — AB (ref 13.0–17.0)
MCH: 30.9 pg (ref 26.0–34.0)
MCHC: 34 g/dL (ref 30.0–36.0)
MCV: 91 fL (ref 78.0–100.0)
PLATELETS: 421 10*3/uL — AB (ref 150–400)
RBC: 3.43 MIL/uL — ABNORMAL LOW (ref 4.22–5.81)
RDW: 12.9 % (ref 11.5–15.5)
WBC: 9.6 10*3/uL (ref 4.0–10.5)

## 2013-05-19 LAB — LEGIONELLA ANTIGEN, URINE: LEGIONELLA ANTIGEN, URINE: NEGATIVE

## 2013-05-19 NOTE — Progress Notes (Signed)
PROGRESS NOTE  Jose [name] [id number] DOB: Nov 24, 1927 DOA: 05/17/2013 PCP: Nyoka Cowden, MD  Assessment/Plan: HCAP - continue Vancomycin and Cefpime for now.  - Clinically improving, confirmed by family - Considered narrowing to levofloxacin tomorrow or Monday HLD Anxiety - ativan as needed HTN - continue norvasc FTT - PEG tube feeding. Nutrition consult.  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 Complicated by dysphagia now with a PEG tube. GERD - Protonix per tube. S/p esophageal dilation at [facility] c/b perforation with pneumothorax s/p repair - CXR here without evidence of pneumothorax.  Acute encephalopathy - multifactorial, recent hospitalization with ICU stay, current infectious process.   Diet: NPO Fluids: KVO DVT Prophylaxis: Lovenox  Code Status: Full Family Communication: Daughter and wife at bedside Disposition Plan: inpatient  Consultants:  none  Procedures:  none   Antibiotics  Anti-infectives   Start     Dose/Rate Route Frequency Ordered Stop   05/18/13 2200  vancomycin (VANCOCIN) IVPB 1000 mg/200 mL premix  Status:  Discontinued     1,000 mg 200 mL/hr over 60 Minutes Intravenous Every 24 hours 05/17/13 2149 05/17/13 2206   05/18/13 2200  vancomycin (VANCOCIN) 1,250 mg in sodium chloride 0.9 % 250 mL IVPB  Status:  Discontinued     1,250 mg 166.7 mL/hr over 90 Minutes Intravenous Every 24 hours 05/17/13 2217 05/18/13 1108   05/18/13 1200  vancomycin (VANCOCIN) IVPB 750 mg/150 ml premix     750 mg 150 mL/hr over 60 Minutes Intravenous Every 12 hours 05/18/13 1109     05/18/13 0600  ceFEPIme (MAXIPIME) 1 g in dextrose 5 % 50 mL IVPB     1 g 100 mL/hr over 30 Minutes Intravenous 3 times per day 05/18/13 0011 05/26/13 0559   05/17/13 2200  vancomycin (VANCOCIN) 1,250 mg in sodium chloride 0.9 % 250 mL IVPB     1,250 mg 166.7 mL/hr over 90 Minutes Intravenous   Once 05/17/13 2148 05/17/13 2356   05/17/13 2130  ceFEPIme (MAXIPIME) 1 g in dextrose 5 % 50 mL IVPB     1 g 100 mL/hr over 30 Minutes Intravenous  Once 05/17/13 2117 05/17/13 2226     Antibiotics Given (last 72 hours)   Date/Time Action Medication Dose Rate   05/18/13 0504 Given   ceFEPIme (MAXIPIME) 1 g in dextrose 5 % 50 mL IVPB 1 g 100 mL/hr   05/18/13 1257 Given   vancomycin (VANCOCIN) IVPB 750 mg/150 ml premix 750 mg 150 mL/hr   05/18/13 1407 Given   ceFEPIme (MAXIPIME) 1 g in dextrose 5 % 50 mL IVPB 1 g 100 mL/hr   05/18/13 2239 Given   ceFEPIme (MAXIPIME) 1 g in dextrose 5 % 50 mL IVPB 1 g 100 mL/hr   05/19/13 0117 Given   vancomycin (VANCOCIN) IVPB 750 mg/150 ml premix 750 mg 150 mL/hr   05/19/13 0531 Given   ceFEPIme (MAXIPIME) 1 g in dextrose 5 % 50 mL IVPB 1 g 100 mL/hr   05/19/13 1233 Given   ceFEPIme (MAXIPIME) 1 g in dextrose 5 % 50 mL IVPB 1 g 100 mL/hr   05/19/13 1336 Given   vancomycin (VANCOCIN) IVPB 750 mg/150 ml premix 750 mg 150 mL/hr      HPI/Subjective: -  Patient much more upbeat this morning, smiling, pleasantly confused  Objective: Filed Vitals:   05/18/13 2008 05/19/13 0442 05/19/13 1327 05/19/13 1709  BP: 154/71 158/70 138/67 129/62  Pulse: 73 74 66 77  Temp: 99.2 F (37.3 C) 99.2 F (37.3 C) 98.4 F (36.9 C)   TempSrc: Oral Oral Other (Comment)   Resp: 18 18 18 18   Height:      Weight:      SpO2: 99% 97% 98% 99%    Intake/Output Summary (Last 24 hours) at 05/19/13 1757 Last data filed at 05/19/13 1337  Gross per 24 hour  Intake   1760 ml  Output    800 ml  Net    960 ml   Filed Weights   05/17/13 2143 05/18/13 0016  Weight: 70.308 kg (155 lb) 71.8 kg (158 lb 4.6 oz)    Exam:  General:  No acute distress  Cardiovascular: regular rate and rhythm, without MRG  Respiratory: good air movement, clear to auscultation throughout, no wheezing, ronchi or rales  Abdomen: soft, not tender to palpation, positive bowel sounds  MSK:  no peripheral edema  Neuro: non focal  Data Reviewed: Basic Metabolic Panel:  Recent Labs Lab 05/17/13 2036 05/18/13 0416 05/19/13 0542  NA 136*  --  133*  K 4.6  --  4.3  CL 99  --  100  CO2 24  --  22  GLUCOSE 150*  --  127*  BUN 27*  --  23  CREATININE 1.10 1.07 1.01  CALCIUM 9.1  --  8.6   Liver Function Tests:  Recent Labs Lab 05/17/13 2036  AST 70*  ALT 79*  ALKPHOS 212*  BILITOT 0.6  PROT 6.5  ALBUMIN 2.4*    Recent Labs Lab 05/17/13 2036  LIPASE 65*   CBC:  Recent Labs Lab 05/17/13 2036 05/18/13 0416 05/19/13 0542  WBC 11.5* 11.1* 9.6  NEUTROABS 9.3*  --   --   HGB 10.4* 11.0* 10.6*  HCT 30.6* 32.6* 31.2*  MCV 90.8 91.6 91.0  PLT 399 396 421*   Cardiac Enzymes:  Recent Labs Lab 05/17/13 2036  TROPONINI <0.30   Studies: Dg Chest 2 View  05/17/2013   CLINICAL DATA:  80 year old male with chest pain, shortness of breath congestion.  EXAM: CHEST  2 VIEW  COMPARISON:  02/08/2013  FINDINGS: Upper limits normal heart size identified.  Pulmonary vascular congestion is identified.  Right lower lobe airspace opacity is suspicious for pneumonia.  There is no evidence of pleural effusion or pneumothorax.  Surgical clips within the right axilla are again noted.  IMPRESSION: Right lower lobe airspace disease/pneumonia -radiographic follow-up to resolution is recommended.  Pulmonary vascular congestion   Electronically Signed   By: Hassan Rowan M.D.   On: 05/17/2013 20:29    Scheduled Meds: . amLODipine  2.5 mg Oral Daily  . carbamazepine  200 mg Per Tube TID  . ceFEPime (MAXIPIME) IV  1 g Intravenous 3 times per day  . enoxaparin (LOVENOX) injection  30 mg Subcutaneous Q24H  . feeding supplement (OSMOLITE 1.5 CAL)  355 mL Per Tube QID  . pantoprazole  40 mg Oral Daily  . sodium chloride  3 mL Intravenous Q12H  . tamsulosin  0.4 mg Oral Daily  . vancomycin  750 mg Intravenous Q12H   Continuous Infusions:   Principal Problem:   HCAP  (healthcare-associated pneumonia) Active Problems:   HYPERLIPIDEMIA   Anxiety state, unspecified   HYPERTENSION   Cancer of base of tongue   SOB (shortness of breath)   FTT (failure to thrive) in adult   Time spent: 35, more than half involving family discussions  This note has  been created with Surveyor, quantity. Any transcriptional errors are unintentional.   Marzetta Board, MD [facility] Pager [phone]. If 7 PM - 7 AM, please contact night-coverage at [email or web address], password [facility] 05/19/2013, 5:57 PM  LOS: 2 days

## 2013-05-20 LAB — CULTURE, RESPIRATORY

## 2013-05-20 LAB — VANCOMYCIN, TROUGH: VANCOMYCIN TR: 17 ug/mL (ref 10.0–20.0)

## 2013-05-20 LAB — CULTURE, RESPIRATORY W GRAM STAIN

## 2013-05-20 NOTE — Progress Notes (Signed)
Report called to Rodena Piety, RN, on 6E. Pt and family aware of transfer to room 1602 and agreeable. Pt transferred to [place] at this time by NT in chair. In possession of pt chart, meds, and all personal belongings.

## 2013-05-20 NOTE — Progress Notes (Signed)
PROGRESS NOTE  Jose [name] YQM:[id number] DOB: 1927/04/12 DOA: 05/17/2013 PCP: Nyoka Cowden, MD  Assessment/Plan: HCAP - continue Vancomycin and Cefpime for now, narrow to Levofloxacin tomorrow.  - Clinically improving, confirmed by family HLD Anxiety - ativan as needed HTN - continue norvasc FTT - PEG tube feeding. Nutrition consult.  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 Complicated by dysphagia now with a PEG tube. GERD - Protonix per tube. S/p esophageal dilation at [facility] c/b perforation with pneumothorax s/p repair - CXR here without evidence of pneumothorax.  Acute encephalopathy - multifactorial, recent hospitalization with ICU stay, current infectious process.   Diet: NPO Fluids: KVO DVT Prophylaxis: Lovenox  Code Status: Full Family Communication: none this morning Disposition Plan: inpatient  Consultants:  none  Procedures:  none   Antibiotics  Anti-infectives   Start     Dose/Rate Route Frequency Ordered Stop   05/18/13 2200  vancomycin (VANCOCIN) IVPB 1000 mg/200 mL premix  Status:  Discontinued     1,000 mg 200 mL/hr over 60 Minutes Intravenous Every 24 hours 05/17/13 2149 05/17/13 2206   05/18/13 2200  vancomycin (VANCOCIN) 1,250 mg in sodium chloride 0.9 % 250 mL IVPB  Status:  Discontinued     1,250 mg 166.7 mL/hr over 90 Minutes Intravenous Every 24 hours 05/17/13 2217 05/18/13 1108   05/18/13 1200  vancomycin (VANCOCIN) IVPB 750 mg/150 ml premix     750 mg 150 mL/hr over 60 Minutes Intravenous Every 12 hours 05/18/13 1109     05/18/13 0600  ceFEPIme (MAXIPIME) 1 g in dextrose 5 % 50 mL IVPB     1 g 100 mL/hr over 30 Minutes Intravenous 3 times per day 05/18/13 0011 05/26/13 0559   05/17/13 2200  vancomycin (VANCOCIN) 1,250 mg in sodium chloride 0.9 % 250 mL IVPB     1,250 mg 166.7 mL/hr over 90 Minutes Intravenous  Once 05/17/13 2148 05/17/13 2356   05/17/13 2130  ceFEPIme (MAXIPIME) 1 g in dextrose 5 % 50 mL IVPB     1 g 100 mL/hr over 30 Minutes Intravenous  Once 05/17/13 2117 05/17/13 2226     Antibiotics Given (last 72 hours)   Date/Time Action Medication Dose Rate   05/18/13 0504 Given   ceFEPIme (MAXIPIME) 1 g in dextrose 5 % 50 mL IVPB 1 g 100 mL/hr   05/18/13 1257 Given   vancomycin (VANCOCIN) IVPB 750 mg/150 ml premix 750 mg 150 mL/hr   05/18/13 1407 Given   ceFEPIme (MAXIPIME) 1 g in dextrose 5 % 50 mL IVPB 1 g 100 mL/hr   05/18/13 2239 Given   ceFEPIme (MAXIPIME) 1 g in dextrose 5 % 50 mL IVPB 1 g 100 mL/hr   05/19/13 0117 Given   vancomycin (VANCOCIN) IVPB 750 mg/150 ml premix 750 mg 150 mL/hr   05/19/13 0531 Given   ceFEPIme (MAXIPIME) 1 g in dextrose 5 % 50 mL IVPB 1 g 100 mL/hr   05/19/13 1233 Given   ceFEPIme (MAXIPIME) 1 g in dextrose 5 % 50 mL IVPB 1 g 100 mL/hr   05/19/13 1336 Given   vancomycin (VANCOCIN) IVPB 750 mg/150 ml premix 750 mg 150 mL/hr   05/19/13 2228 Given   ceFEPIme (MAXIPIME) 1 g in dextrose 5 % 50 mL IVPB 1 g 100 mL/hr   05/20/13 0031 Given   vancomycin (VANCOCIN) IVPB 750 mg/150 ml premix 750 mg 150 mL/hr   05/20/13 0509  Given   ceFEPIme (MAXIPIME) 1 g in dextrose 5 % 50 mL IVPB 1 g 100 mL/hr      HPI/Subjective: -  Again very emotional this morning  Objective: Filed Vitals:   05/19/13 1327 05/19/13 1709 05/19/13 2026 05/20/13 0425  BP: 138/67 129/62 146/72 149/67  Pulse: 66 77 66 72  Temp: 98.4 F (36.9 C)  98.4 F (36.9 C) 98.4 F (36.9 C)  TempSrc: Other (Comment)  Oral Oral  Resp: 18 18 18 18   Height:      Weight:      SpO2: 98% 99% 98% 98%    Intake/Output Summary (Last 24 hours) at 05/20/13 0814 Last data filed at 05/20/13 5102  Gross per 24 hour  Intake   2365 ml  Output    450 ml  Net   1915 ml   Filed Weights   05/17/13 2143 05/18/13 0016  Weight: 70.308 kg (155 lb) 71.8 kg (158 lb 4.6 oz)    Exam:  General:  No acute distress,  crying  Cardiovascular: regular rate and rhythm, without MRG  Respiratory: good air movement, clear to auscultation throughout, no wheezing, ronchi or rales  Abdomen: soft, not tender to palpation, positive bowel sounds  MSK: no peripheral edema  Neuro: non focal  Data Reviewed: Basic Metabolic Panel:  Recent Labs Lab 05/17/13 2036 05/18/13 0416 05/19/13 0542  NA 136*  --  133*  K 4.6  --  4.3  CL 99  --  100  CO2 24  --  22  GLUCOSE 150*  --  127*  BUN 27*  --  23  CREATININE 1.10 1.07 1.01  CALCIUM 9.1  --  8.6   Liver Function Tests:  Recent Labs Lab 05/17/13 2036  AST 70*  ALT 79*  ALKPHOS 212*  BILITOT 0.6  PROT 6.5  ALBUMIN 2.4*    Recent Labs Lab 05/17/13 2036  LIPASE 65*   CBC:  Recent Labs Lab 05/17/13 2036 05/18/13 0416 05/19/13 0542  WBC 11.5* 11.1* 9.6  NEUTROABS 9.3*  --   --   HGB 10.4* 11.0* 10.6*  HCT 30.6* 32.6* 31.2*  MCV 90.8 91.6 91.0  PLT 399 396 421*   Cardiac Enzymes:  Recent Labs Lab 05/17/13 2036  TROPONINI <0.30   Studies: No results found.  Scheduled Meds: . amLODipine  2.5 mg Oral Daily  . carbamazepine  200 mg Per Tube TID  . ceFEPime (MAXIPIME) IV  1 g Intravenous 3 times per day  . enoxaparin (LOVENOX) injection  30 mg Subcutaneous Q24H  . feeding supplement (OSMOLITE 1.5 CAL)  355 mL Per Tube QID  . pantoprazole  40 mg Oral Daily  . sodium chloride  3 mL Intravenous Q12H  . tamsulosin  0.4 mg Oral Daily  . vancomycin  750 mg Intravenous Q12H   Continuous Infusions:   Principal Problem:   HCAP (healthcare-associated pneumonia) Active Problems:   HYPERLIPIDEMIA   Anxiety state, unspecified   HYPERTENSION   Cancer of base of tongue   SOB (shortness of breath)   FTT (failure to thrive) in adult  Time spent: 25  This note has been created with Surveyor, quantity. Any transcriptional errors are unintentional.   Marzetta Board, MD [facility] Pager [phone]. If 7 PM - 7 AM, please contact night-coverage at [email or web address], password [facility] 05/20/2013, 8:14 AM  LOS: 3 days

## 2013-05-21 MED ORDER — TRAMADOL HCL 50 MG PO TABS: 50.0000 mg | ORAL_TABLET | Freq: Three times a day (TID) | ORAL | Status: DC | PRN

## 2013-05-21 MED ORDER — LEVOFLOXACIN 750 MG PO TABS: 750.0000 mg | ORAL_TABLET | Freq: Every day | ORAL | Status: DC

## 2013-05-21 MED ORDER — LEVOFLOXACIN 750 MG PO TABS
750.0000 mg | ORAL_TABLET | Freq: Every day | ORAL | Status: DC
  Administered 2013-05-21: 750 mg via ORAL
  Filled 2013-05-21: qty 1

## 2013-05-21 MED ORDER — LEVOFLOXACIN IN D5W 750 MG/150ML IV SOLN
750.0000 mg | INTRAVENOUS | Status: DC
  Filled 2013-05-21: qty 150

## 2013-05-21 NOTE — Progress Notes (Signed)
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  He has chronic scarring/stricture formation postradiation.  During that procedure, his esophagus was perforated, sounds like (history from his daughter) he developed a pneumothorax requiring chest tube on left, a lot of subcutaneous air in his face/neck, he underwent emergent surgical repair of his esophageal perforation.  She thinks they patched it.  After the surgery, he was in ICU intubated for several days.  He was discharged about 2 weeks ago to home with no HH/or PT set up.  Prior to this hospitalization, he was doing well, walking well with walker, still communicative with normal cognitive function.  Since his discharge to home 2 weeks ago, he has barely been able to get out of bed.  He eats nothing but mouth for last 2 years, has been tolerating his tube feeds however.  No fevers.  No n/v/d.  He has been coughing a lot for the last several days.  His wife, who is in the next room in the ED being seen for a cough also, has been sick with URI symptoms.  Daughter is with dad right now.  She states that since he left [place], he has been more confused.  No other focal neuro deficits.  Pt is pleasantly confused, he denies any pain, appears comfortable.  He says he is waiting for superman.  Pt coughs a lot during my interview   PT Comments   Progressing, will need SNF  Follow Up Recommendations  SNF;Supervision/Assistance - 24 hour     Does the patient have the potential to tolerate intense rehabilitation     Barriers to Discharge        Equipment Recommendations  None recommended by  PT    Recommendations for Other Services    Frequency Min 3X/week   Progress towards PT Goals Progress towards PT goals: Progressing toward goals  Plan Current plan remains appropriate    Precautions / Restrictions Precautions Precautions: Fall Precaution Comments:  PEG, NPO   Pertinent Vitals/Pain     Mobility  Bed Mobility Overal bed mobility: Needs Assistance Bed Mobility: Supine to Sit;Sit to Supine Supine to sit: Supervision Sit to supine: Min guard General bed mobility comments: extra time, cues for task completion Transfers Overall transfer level: Needs assistance Equipment used: Rolling walker (2 wheeled) Transfers: Sit to/from Stand Sit to Stand: Min assist General transfer comment: assist to rise and stabilize,  cues for hand placement Ambulation/Gait Ambulation/Gait assistance: Min assist;Mod assist Ambulation Distance (Feet): 150 Feet Assistive device: Rolling walker (2 wheeled) Gait Pattern/deviations: Decreased stride length;Narrow base of support General Gait Details: cues for posture, incr step length, RW safety, knees buckling x 2 with min-mod assist for balance and safety    Exercises     PT Diagnosis:    PT Problem List:   PT Treatment Interventions:     PT Goals (current goals can now be found in the care plan section) Acute Rehab PT Goals Time For Goal Achievement: 06/01/13 Potential to Achieve Goals: Good  Visit Information  Last PT Received On: 05/21/13 Assistance Needed: +2 History of Present Illness: yo male h/o cancer at base of tongue over  2 years ago s/p chemo/radiation/surg with gtube since then was at [facility] in the last month to undergo an esophageal dilation.  He has chronic scarring/stricture formation postradiation.  During that procedure, his esophagus was perforated, sounds like (history from his daughter) he developed a pneumothorax requiring chest tube on left, a lot of subcutaneous air in his face/neck, he underwent  emergent surgical repair of his esophageal perforation.  She thinks they patched it.  After the surgery, he was in ICU intubated for several days.  He was discharged about 2 weeks ago to home with no HH/or PT set up.  Prior to this hospitalization, he was doing well, walking well with walker, still communicative with normal cognitive function.  Since his discharge to home 2 weeks ago, he has barely been able to get out of bed.  He eats nothing but mouth for last 2 years, has been tolerating his tube feeds however.  No fevers.  No n/v/d.  He has been coughing a lot for the last several days.  His wife, who is in the next room in the ED being seen for a cough also, has been sick with URI symptoms.  Daughter is with dad right now.  She states that since he left [place], he has been more confused.  No other focal neuro deficits.  Pt is pleasantly confused, he denies any pain, appears comfortable.  He says he is waiting for superman.  Pt coughs a lot during my interview    Subjective Data      Cognition  Cognition Arousal/Alertness: Awake/alert Behavior During Therapy: WFL for tasks assessed/performed Overall Cognitive Status: Impaired/Different from baseline Area of Impairment: Following commands;Safety/judgement;Attention Current Attention Level: Sustained Memory: Decreased short-term memory Following Commands: Follows one step commands inconsistently Safety/Judgement: Decreased awareness of safety;Decreased awareness of deficits General Comments: pt with difficulty focusing requires redirection to task,  becomes mildly agitated at times,  tangential at  times    Balance  Balance Standing balance-Leahy Scale: Poor  End of Session PT - End of Session Activity Tolerance: Patient tolerated treatment well Patient left: with call bell/phone within reach;in bed;with bed alarm set;with nursing/sitter in room Nurse Communication: Mobility status   GP     [facility] 05/21/2013, 11:10 AM

## 2013-05-21 NOTE — Progress Notes (Addendum)
ANTIBIOTIC CONSULT NOTE - INITIAL  Pharmacy Consult for Levofloxacin Indication: HCAP  Allergies  Allergen Reactions  . Hydromorphone Swelling and Other (See Comments)    phlebitis  . Morphine And Related Other (See Comments)    Phlebitis, makes pt out of it   . Codeine Other (See Comments)    Dizzy, mental status changes    Patient Measurements: Height: 5\' 10"  (177.8 cm) Weight: 158 lb 4.6 oz (71.8 kg) IBW/kg (Calculated) : 73   Vital Signs: Temp: 97.8 F (36.6 C) (03/02 0422) Temp src: Oral (03/02 0422) BP: 156/80 mmHg (03/02 0422) Pulse Rate: 71 (03/02 0422) Intake/Output from previous day: 03/01 0701 - 03/02 0700 In: 2329.3 [I.V.:92.3; NG/GT:1597; IV Piggyback:400] Out: 1201 [Urine:1200; Stool:1] Intake/Output from this shift:    Labs:  Recent Labs  05/19/13 0542  WBC 9.6  HGB 10.6*  PLT 421*  CREATININE 1.01   Estimated Creatinine Clearance: 56.3 ml/min (by C-G formula based on Cr of 1.01).  Recent Labs  05/20/13 2300  VANCOTROUGH 17.0     Microbiology: Recent Results (from the past 720 hour(s))  CULTURE, BLOOD (ROUTINE X 2)     Status: None   Collection Time    05/17/13  8:36 PM      Result Value Ref Range Status   Specimen Description BLOOD LEFT ANTECUBITAL   Final   Special Requests BOTTLES DRAWN AEROBIC AND ANAEROBIC 5CC   Final   Culture  Setup Time     Final   Value: 05/18/2013 01:05     Performed at [facility]   Culture     Final   Value:        BLOOD CULTURE RECEIVED NO GROWTH TO DATE CULTURE WILL BE HELD FOR 5 DAYS BEFORE ISSUING A FINAL NEGATIVE REPORT     Performed at [facility]   Report Status PENDING   Incomplete  CULTURE, BLOOD (ROUTINE X 2)     Status: None   Collection Time    05/17/13  9:35 PM      Result Value Ref Range Status   Specimen Description BLOOD LEFT HAND   Final   Special Requests BOTTLES DRAWN AEROBIC AND ANAEROBIC 3CC   Final   Culture  Setup Time     Final   Value: 05/18/2013 01:05      Performed at [facility]   Culture     Final   Value:        BLOOD CULTURE RECEIVED NO GROWTH TO DATE CULTURE WILL BE HELD FOR 5 DAYS BEFORE ISSUING A FINAL NEGATIVE REPORT     Performed at [facility]   Report Status PENDING   Incomplete  CULTURE, EXPECTORATED SPUTUM-ASSESSMENT     Status: None   Collection Time    05/18/13  9:23 AM      Result Value Ref Range Status   Specimen Description SPUTUM   Final   Special Requests NONE   Final   Sputum evaluation     Final   Value: THIS SPECIMEN IS ACCEPTABLE. RESPIRATORY CULTURE REPORT TO FOLLOW.   Report Status 05/18/2013 FINAL   Final  CULTURE, RESPIRATORY (NON-EXPECTORATED)     Status: None   Collection Time    05/18/13  9:23 AM      Result Value Ref Range Status   Specimen Description SPUTUM   Final   Special Requests NONE   Final   Gram Stain     Final   Value: MODERATE WBC PRESENT,BOTH  PMN AND MONONUCLEAR     FEW SQUAMOUS EPITHELIAL CELLS PRESENT     RARE GRAM POSITIVE COCCI IN PAIRS     Performed at [facility]   Culture     Final   Value: FEW STREPTOCOCCUS,BETA HEMOLYTIC NOT GROUP A     Performed at [facility]   Report Status 05/20/2013 FINAL   Final    Medical History: Past Medical History  Diagnosis Date  . Blood transfusion     2006 had 4 units  . Arthritis     knees,back,shoulders  . Anxiety   . Right ureteral stone S/P URETERAL STENT 01-24-11  AND ESWL  03-01-11  . History of acute renal failure 01-23-11    DUE TO HYDRONEPHROSIS AND RIGHT STONE  . Normal cardiac stress test 09-25-2007  . Glaucoma   . Cataract immature   . Diverticulosis of colon   . History of GI diverticular bleed   . Hyperlipemia   . Impaired hearing NOT WEARING HIS BILATERL AIDS  . Urgency of urination     DUE TO URETERAL STONE AND STENT  . Frequency of urination   . Insomnia   . Anemia   . Chronic kidney disease     kidney stones  . GERD (gastroesophageal reflux disease)   . Hernia     inguinal   . Headache(784.0)     migraines  . Trigeminal neuralgia   . Hypertension   . History of radiation therapy 06/21/11-08/04/11    tongue/h/n 69.96 gy in 33 fxs  . History of chemotherapy 07/26/11    taxol/carbo completed/stopped 07/26/11  . 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 G tube feedings   . History of radiation therapy 06/21/11-08/04/11    base of tongue  . Male breast cancer 1988    S/P RIGHT MASTECTOMY W/ NODE DISSECTION AND CHEMO  . 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 Skin cancer 2002    melanoma- back  . Colon polyps   . Diverticulosis     Medications:  Scheduled:  . amLODipine  2.5 mg Oral Daily  . carbamazepine  200 mg Per Tube TID  . enoxaparin (LOVENOX) injection  30 mg Subcutaneous Q24H  . feeding supplement (OSMOLITE 1.5 CAL)  355 mL Per Tube QID  . levofloxacin (LEVAQUIN) IV  750 mg Intravenous Q24H  . pantoprazole  40 mg Oral Daily  . sodium chloride  3 mL Intravenous Q12H  . tamsulosin  0.4 mg Oral Daily   Infusions:   PRN: sodium chloride, LORazepam, sodium chloride, zolpidem  Assessment: 80 y/o M with HCAP, clinically improving on D#5 empiric cefepime / vancomycin.  Sputum culture grew streptococcus, beta-hemolytic, not group A.   Orders received for de-escalation to levofloxacin monotherapy with pharmacy dosing assistance.  Goal of Therapy:  Appropriate antibiotic dosing for renal function; eradication of infection  Plan:  1. Levaquin 750 mg IV q24h 2. Follow serum creatinine, clinical course.  Clayburn Pert, PharmD, BCPS Pager: [phone] 05/21/2013  7:30 AM

## 2013-05-21 NOTE — Discharge Summary (Signed)
Physician Discharge Summary  Jose [name] ZWC:[id number] DOB: [birth year] DOA: 05/17/2013  PCP: Nyoka Cowden, MD  Admit date: 05/17/2013 Discharge date: 05/21/2013  Time spent: 35 minutes  Recommendations for Outpatient Follow-up:  1. 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 He has chronic scarring/stricture formation postradiation. During that procedure, his esophagus was perforated, sounds like (history from his daughter) he developed a pneumothorax requiring chest tube on left, a lot of subcutaneous air in his face/neck, he underwent emergent surgical repair of his esophageal perforation. She thinks they patched it. After the surgery, he was in ICU intubated for several days. He was discharged about 2 weeks ago to home with no HH/or PT set up. Prior to this hospitalization, he was doing well, walking well with walker, still communicative with normal cognitive function. Since his discharge to home 2 weeks ago, he has barely been able to get out of bed. He eats nothing but mouth for last 2 years, has been tolerating his tube feeds however. No fevers. No n/v/d. He has been coughing a lot for the last several days. His wife, who is in the next room in the ED being seen for a cough  also, has been sick with URI symptoms. Daughter is with dad right now. She states that since he left [place], he has been more confused. No other focal neuro deficits. Pt is pleasantly confused, he denies any pain, appears comfortable. He says he is waiting for superman. Pt coughs a lot during my interview.  Hospital Course:  HCAP - patient started on vancomycin and cefepime for HCAP with subsequent improvement in his respiratory and mental status. Clinically improving and close to baseline, confirmed by family. His antibiotics were transitioned to levofloxacin and he is to complete 6 additional days of Levaquin. I recommend follow up CXR to ensure resolution. Patient should be NPO as he it at risk for aspiration and should continue tube feeding.  Anxiety - ativan as needed  HTN - continue norvasc  FTT - PEG tube feeding. Nutrition consult.  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 Complicated by dysphagia now with a PEG tube.  GERD - Protonix per tube.  S/p esophageal dilation at [facility] c/b perforation with pneumothorax s/p repair - CXR here without evidence of pneumothorax. Followup as an outpatient. Acute encephalopathy - multifactorial, recent hospitalization with ICU stay, current infection. His encephalopathy seems to have been on a baseline of perhaps very mild dementia (family noticed some very mild memory and cognitive problems in the past) with a steep decline following his most recent ICU stay at [facility]. He is now a bit improved and his mental status needs to be monitored  following treatment of his current infectious process. I recommend that he stops taking the Ambien.  Procedures:  none   Consultations:  none  Discharge Exam: Filed Vitals:   05/20/13 0941 05/20/13 1338 05/20/13 2035 05/21/13 0422  BP: 145/64 128/96 137/70 156/80  Pulse:  71 72 71  Temp:  98.2 F (36.8 C) 98.2 F (36.8 C) 97.8 F (36.6  C)  TempSrc:  Oral Oral Oral  Resp:  20 20 18   Height:      Weight:      SpO2:  97% 96% 96%   General: NAD Cardiovascular: RRR Respiratory: CTA biL  Discharge Instructions       Future Appointments Provider Department Dept Phone   05/23/2013 9:30 AM [facility] [phone]   05/23/2013 10:30 AM [facility] [phone]   Liquids only 4 hours prior to your exam. Any medications can be taken as usual. Please arrive 15 min prior to your scheduled exam time.   06/01/2013 3:15 PM Heath Lark, MD [facility] [phone]   08/23/2013 10:00 AM Marye Round, [facility] [phone]       Medication List    STOP taking these medications       zolpidem 5 MG tablet  Commonly known as:  AMBIEN      TAKE these medications       amLODipine 5 MG tablet  Commonly known as:  NORVASC  Take 0.5 tablets (2.5 mg total) by mouth daily.     carbamazepine 200 MG tablet  Commonly known as:  TEGRETOL  200 mg by PEG Tube route 3 (three) times daily.     feeding supplement (OSMOLITE 1.5 CAL) Liqd  Increase Osmolite 1.5 via feeding tube to 1.5 cans QID with 60 cc water before and after each bolus TF.  Add 240 cc free water TID between feedings as tolerated.     levofloxacin 750 MG tablet  Commonly known as:  LEVAQUIN  Take 1 tablet (750 mg total) by mouth daily.     LORazepam 0.5 MG tablet  Commonly known as:  ATIVAN  - 0.5 mg by PEG Tube route 3 (three) times daily as needed for anxiety. ANXIETY  -      omeprazole 40 MG capsule  Commonly known as:  PRILOSEC  40 mg by Feeding Tube route daily.     SLEEP AID PO  2 capsules by PEG Tube route at bedtime as needed (sleep).     tamsulosin 0.4 MG Caps capsule  Commonly known as:  FLOMAX  Take 0.4 mg by mouth daily.     traMADol 50 MG tablet  Commonly known as:  ULTRAM  Take 1 tablet  (50 mg total) by mouth every 8 (eight) hours as needed.     Travoprost (BAK Free) 0.004 % Soln ophthalmic solution  Commonly known as:  TRAVATAN  Place 1 drop into both eyes at bedtime.       Follow-up Information   Follow up with Nyoka Cowden, MD. Schedule an appointment as soon as possible for a visit in 1 week.   Specialty:  Internal Medicine   Contact information:   [place] [address] [phone]       The results of significant diagnostics from this hospitalization (including imaging, microbiology, ancillary and laboratory) are listed below for reference.    Significant Diagnostic Studies: Dg Chest 2  View  05/17/2013   CLINICAL DATA:  80 year old male with chest pain, shortness of breath congestion.  EXAM: CHEST  2 VIEW  COMPARISON:  02/08/2013  FINDINGS: Upper limits normal heart size identified.  Pulmonary vascular congestion is identified.  Right lower lobe airspace opacity is suspicious for pneumonia.  There is no evidence of pleural effusion or pneumothorax.  Surgical clips within the right axilla are again noted.  IMPRESSION: Right lower lobe airspace disease/pneumonia -radiographic follow-up to resolution is recommended.  Pulmonary vascular congestion   Electronically Signed   By: Hassan Rowan M.D.   On: 05/17/2013 20:29    Microbiology: Recent Results (from the past 240 hour(s))  CULTURE, BLOOD (ROUTINE X 2)     Status: None   Collection Time    05/17/13  8:36 PM      Result Value Ref Range Status   Specimen Description BLOOD LEFT ANTECUBITAL   Final   Special Requests BOTTLES DRAWN AEROBIC AND ANAEROBIC 5CC   Final   Culture  Setup Time     Final   Value: 05/18/2013 01:05     Performed at [facility]   Culture     Final   Value:        BLOOD CULTURE RECEIVED NO GROWTH TO DATE CULTURE WILL BE HELD FOR 5 DAYS BEFORE ISSUING A FINAL NEGATIVE REPORT     Performed at [facility]   Report Status PENDING   Incomplete    CULTURE, BLOOD (ROUTINE X 2)     Status: None   Collection Time    05/17/13  9:35 PM      Result Value Ref Range Status   Specimen Description BLOOD LEFT HAND   Final   Special Requests BOTTLES DRAWN AEROBIC AND ANAEROBIC 3CC   Final   Culture  Setup Time     Final   Value: 05/18/2013 01:05     Performed at [facility]   Culture     Final   Value:        BLOOD CULTURE RECEIVED NO GROWTH TO DATE CULTURE WILL BE HELD FOR 5 DAYS BEFORE ISSUING A FINAL NEGATIVE REPORT     Performed at [facility]   Report Status PENDING   Incomplete  CULTURE, EXPECTORATED SPUTUM-ASSESSMENT     Status: None   Collection Time    05/18/13  9:23 AM      Result Value Ref Range Status   Specimen Description SPUTUM   Final   Special Requests NONE   Final   Sputum evaluation     Final   Value: THIS SPECIMEN IS ACCEPTABLE. RESPIRATORY CULTURE REPORT TO FOLLOW.   Report Status 05/18/2013 FINAL   Final  CULTURE, RESPIRATORY (NON-EXPECTORATED)     Status: None   Collection Time    05/18/13  9:23 AM      Result Value Ref Range Status   Specimen Description SPUTUM   Final   Special Requests NONE   Final   Gram Stain     Final   Value: MODERATE WBC PRESENT,BOTH PMN AND MONONUCLEAR     FEW SQUAMOUS EPITHELIAL CELLS PRESENT     RARE GRAM POSITIVE COCCI IN PAIRS     Performed at [facility]   Culture     Final   Value: FEW STREPTOCOCCUS,BETA HEMOLYTIC NOT GROUP A     Performed at [facility]   Report Status 05/20/2013 FINAL   Final     Labs: Basic  Metabolic Panel:  Recent Labs Lab 05/17/13 2036 05/18/13 0416 05/19/13 0542  NA 136*  --  133*  K 4.6  --  4.3  CL 99  --  100  CO2 24  --  22  GLUCOSE 150*  --  127*  BUN 27*  --  23  CREATININE 1.10 1.07 1.01  CALCIUM 9.1  --  8.6   Liver Function Tests:  Recent Labs Lab 05/17/13 2036  AST 70*  ALT 79*  ALKPHOS 212*  BILITOT 0.6  PROT 6.5  ALBUMIN 2.4*    Recent Labs Lab 05/17/13 2036  LIPASE 65*    CBC:  Recent Labs Lab 05/17/13 2036 05/18/13 0416 05/19/13 0542  WBC 11.5* 11.1* 9.6  NEUTROABS 9.3*  --   --   HGB 10.4* 11.0* 10.6*  HCT 30.6* 32.6* 31.2*  MCV 90.8 91.6 91.0  PLT 399 396 421*   Cardiac Enzymes:  Recent Labs Lab 05/17/13 2036  TROPONINI <0.30    Signed:  Marzetta Board  [facility] 05/21/2013, 10:00 AM

## 2013-05-21 NOTE — Progress Notes (Signed)
Family has chosen [facility] for [facility]. Pt is ready for d/c today. [facility] has been contacted for transport but service is running behind and delays are expected. NSG / SNF / FAMILY/PT notified.  Werner Lean LCSW [phone]

## 2013-05-21 NOTE — Progress Notes (Signed)
ANTIBIOTIC CONSULT NOTE - FOLLOW-UP  Pharmacy Consult for:  Vancomycin, Renal dose adjustment Indication:  Pneumonia (HCAP)  Allergies  Allergen Reactions  . Hydromorphone Swelling and Other (See Comments)    phlebitis  . Morphine And Related Other (See Comments)    Phlebitis, makes pt out of it   . Codeine Other (See Comments)    Dizzy, mental status changes    Patient Measurements: Height: 5\' 10"  (177.8 cm) Weight: 158 lb 4.6 oz (71.8 kg) IBW/kg (Calculated) : 73   Vital Signs: Temp: 98.2 F (36.8 C) (03/01 2035) Temp src: Oral (03/01 2035) BP: 137/70 mmHg (03/01 2035) Pulse Rate: 72 (03/01 2035)  Labs:  Recent Labs  05/18/13 0416 05/19/13 0542  WBC 11.1* 9.6  HGB 11.0* 10.6*  PLT 396 421*  CREATININE 1.07 1.01   Estimated Creatinine Clearance: 56.3 ml/min (by C-G formula based on Cr of 1.01).    Medical History: Past Medical History  Diagnosis Date  . Blood transfusion     2006 had 4 units  . Arthritis     knees,back,shoulders  . Anxiety   . Right ureteral stone S/P URETERAL STENT 01-24-11  AND ESWL  03-01-11  . History of acute renal failure 01-23-11    DUE TO HYDRONEPHROSIS AND RIGHT STONE  . Normal cardiac stress test 09-25-2007  . Glaucoma   . Cataract immature   . Diverticulosis of colon   . History of GI diverticular bleed   . Hyperlipemia   . Impaired hearing NOT WEARING HIS BILATERL AIDS  . Urgency of urination     DUE TO URETERAL STONE AND STENT  . Frequency of urination   . Insomnia   . Anemia   . Chronic kidney disease     kidney stones  . GERD (gastroesophageal reflux disease)   . Hernia     inguinal  . Headache(784.0)     migraines  . Trigeminal neuralgia   . Hypertension   . History of radiation therapy 06/21/11-08/04/11    tongue/h/n 69.96 gy in 33 fxs  . History of chemotherapy 07/26/11    taxol/carbo completed/stopped 07/26/11  . 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 G tube feedings   . History of radiation therapy  06/21/11-08/04/11    base of tongue  . Male breast cancer 1988    S/P RIGHT MASTECTOMY W/ NODE DISSECTION AND CHEMO  . 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 Skin cancer 2002    melanoma- back  . Colon polyps   . Diverticulosis     Medications:     Assessment:  Asked to assist with Vancomycin therapy for this 80 year-old male with complaint of shortness of breath and cough, and presumed healthcare-associated pneumonia.  Cefepime has also been ordered.  History of squamous cell carcinoma of base of tongue; receives feedings via G-tube.    Hospital stay at [facility] in the last month, which included intubation and several days in the ICU.   Day #4 Vanc/Cefepime  Vanc trough = 17 mcg/ml on Vanc 750mg  IV q12h  2/26 >> Vancomycin >> 2/26 >> Cefepime (MD) >>   Tmax: Afeb WBCs: 9.6 Renal: SCr 1.01 CrCl 56 CG / 56 N  2/26 blood x 2: Collected 2/27 blood x 2: Collected 2/26 Legionella antigen, urine: pending 2/26 Strep pneumoniae urinary antigen: Neg 2/26 sputum: FEW STREPTOCOCCUS,BETA HEMOLYTIC NOT GROUP A  Goals of Therapy:   Vancomycin trough levels 15-20 mcg/ml  Antibiotic doses appropriate for  renal function  Eradication of infection   Plan:   Continue vancomycin 750mg  IV q12h  Vancomycin levels as needed to guide dosing  Close monitoring of renal function  Leone Haven, PharmD  05/21/2013, 12:31 AM

## 2013-05-22 ENCOUNTER — Encounter: Payer: Self-pay | Admitting: Adult Health

## 2013-05-22 ENCOUNTER — Telehealth: Payer: Self-pay | Admitting: Hematology and Oncology

## 2013-05-22 ENCOUNTER — Non-Acute Institutional Stay (SKILLED_NURSING_FACILITY): Payer: Medicare Other | Admitting: Adult Health

## 2013-05-22 DIAGNOSIS — I10 Essential (primary) hypertension: Secondary | ICD-10-CM

## 2013-05-22 DIAGNOSIS — G500 Trigeminal neuralgia: Secondary | ICD-10-CM

## 2013-05-22 DIAGNOSIS — R1319 Other dysphagia: Secondary | ICD-10-CM | POA: Insufficient documentation

## 2013-05-22 DIAGNOSIS — C01 Malignant neoplasm of base of tongue: Secondary | ICD-10-CM

## 2013-05-22 DIAGNOSIS — J189 Pneumonia, unspecified organism: Secondary | ICD-10-CM

## 2013-05-22 DIAGNOSIS — N400 Enlarged prostate without lower urinary tract symptoms: Secondary | ICD-10-CM

## 2013-05-22 DIAGNOSIS — H409 Unspecified glaucoma: Secondary | ICD-10-CM

## 2013-05-22 DIAGNOSIS — F411 Generalized anxiety disorder: Secondary | ICD-10-CM

## 2013-05-22 NOTE — Progress Notes (Signed)
Clinical Social Work Department CLINICAL SOCIAL WORK PLACEMENT NOTE 05/22/2013  Patient:  Jose [name], Jose [name]  Account Number:  [id number] Admit date:  05/17/2013  Clinical Social Worker:  Renold Genta  Date/time:  05/18/2013 03:21 PM  Clinical Social Work is seeking post-discharge placement for this patient at the following level of care:   SKILLED NURSING   (*CSW will update this form in Epic as items are completed)   05/18/2013  Patient/family provided with [place] Department of Clinical Social Work's list of facilities offering this level of care within the geographic area requested by the patient (or if unable, by the patient's family).  05/18/2013  Patient/family informed of their freedom to choose among providers that offer the needed level of care, that participate in Medicare, Medicaid or managed care program needed by the patient, have an available bed and are willing to accept the patient.  05/18/2013  Patient/family informed of [facility]' ownership interest in [facility], as well as of the fact that they are under no obligation to receive care at this facility.  PASARR submitted to EDS on 05/18/2013 PASARR number received from EDS on 05/18/2013  FL2 transmitted to all facilities in geographic area requested by pt/family on  05/18/2013 FL2 transmitted to all facilities within larger geographic area on   Patient informed that his/her managed care company has contracts with or will negotiate with  certain facilities, including the following:     Patient/family informed of bed offers received:  05/18/2013 Patient chooses bed at [facility] Physician recommends and patient chooses bed at    Patient to be transferred to [facility] on  05/22/2013 Patient to be transferred to facility by [facility]  The following physician request were entered in Epic:   Additional Comments: [employer] provided prior auth for SNF and AMB .  Werner Lean LCSW  [phone]

## 2013-05-22 NOTE — Progress Notes (Signed)
Patient ID: Jose [name], male   DOB: February 24, 1928, 80 y.o.   MRN: [id number]    [facility]  Allergies  Allergen Reactions  . Hydromorphone Swelling and Other (See Comments)    phlebitis  . Morphine And Related Other (See Comments)    Phlebitis, makes pt out of it   . Codeine Other (See Comments)    Dizzy, mental status changes     Chief Complaint  Patient presents with  . Hospitalization Follow-up    HPI:  He has history of cancer of tongue with chemo; radiation therapy and surgery. He was peg tube dependent. One month ago he underwent an esophageal dilatation; in which there was a perforation this was repaired; he also suffered a left pneumothorax and subcutaneous air as well. 2 weeks prior to his hospitalization he was discharged home. He continued to have a cough; he became more confused. His family brought him to the er. He was treated for pneumonia. He is here for short term rehab with his goal to return back home.     Past Medical History  Diagnosis Date  . Blood transfusion     2006 had 4 units  . Arthritis     knees,back,shoulders  . Anxiety   . Right ureteral stone S/P URETERAL STENT 01-24-11  AND ESWL  03-01-11  . History of acute renal failure 01-23-11    DUE TO HYDRONEPHROSIS AND RIGHT STONE  . Normal cardiac stress test 09-25-2007  . Glaucoma   . Cataract immature   . Diverticulosis of colon   . History of GI diverticular bleed   . Hyperlipemia   . Impaired hearing NOT WEARING HIS BILATERL AIDS  . Urgency of urination     DUE TO URETERAL STONE AND STENT  . Frequency of urination   . Insomnia   . Anemia   . Chronic kidney disease     kidney stones  . GERD (gastroesophageal reflux disease)   . Hernia     inguinal  . Headache(784.0)     migraines  . Trigeminal neuralgia   . Hypertension   . History of radiation therapy 06/21/11-08/04/11    tongue/h/n 69.96 gy in 33 fxs  . History of chemotherapy 07/26/11    taxol/carbo completed/stopped 07/26/11  .  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 G tube feedings   . History of radiation therapy 06/21/11-08/04/11    base of tongue  . Male breast cancer 1988    S/P RIGHT MASTECTOMY W/ NODE DISSECTION AND CHEMO  . 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 Skin cancer 2002    melanoma- back  . Colon polyps   . Diverticulosis     Past Surgical History  Procedure Laterality Date  . Cystoscopy w/ ureteral stent placement  01/24/2011    Procedure: CYSTOSCOPY WITH RETROGRADE PYELOGRAM/URETERAL STENT PLACEMENT;  Surgeon: Fredricka Bonine, MD;  Location: [facility];  Service: Urology;  Laterality: Right;  . Left leg surg for fx  AGE 23    patient states left thigh  . Craniectomy suboccipital for exploration / decompression cranial nerves  2003    5TH CRANIAL NERVE DECOMPRESSION  . Simple mastectomy  1988    MALE--- RIGHT BREAST W/ NODE DISSECTION  . Melanoma excision  10 YRS AGO    BACK AREA  . Extracorporeal shock wave lithotripsy  03-01-11    RIGHT  . Ureteroscopy  03/26/2011    Procedure: URETEROSCOPY;  Surgeon: Marja Kays  Junious Silk, MD;  Location: [facility];  Service: Urology;  Laterality: Right;  RIGHT URETEROSCOPY, HOLMIUM LASER AND STENT   . Examination under anesthesia  03/26/2011    Procedure: EXAM UNDER ANESTHESIA;  Surgeon: Fredricka Bonine, MD;  Location: [facility];  Service: Urology;  Laterality: N/A;  . Lymph node biopsy  04/21/2011    Procedure: LYMPH NODE BIOPSY;  Surgeon: Beckie Salts, MD;  Location: [place];  Service: ENT;  Laterality: Right;  . Gastrostomy tube placement    . Esophagoscopy with dilitation N/A 02/14/2013    Procedure: ESOPHAGOSCOPY WITH DILITATION;  Surgeon: Izora Gala, MD;  Location: [place];  Service: ENT;  Laterality: N/A;  Attempted  . Direct laryngoscopy  02/14/2013    Procedure: DIRECT LARYNGOSCOPY;  Surgeon: Izora Gala, MD;  Location: [facility] OR;  Service: ENT;;  . Esophageal dilation  05/01/13    tread method  esophageal dilation - Dr. Fredricka Bonine  . Diagnostic laproscopy  05/03/13    Dr. Margarette Canada at [place] BP 134/70  Pulse 87  Ht $R'5\' 10"'qk$  (1.778 m)  Wt 149 lb (67.586 kg)  BMI 21.38 kg/m2   Patient's Medications  New Prescriptions   No medications on file  Previous Medications   AMLODIPINE (NORVASC) 5 MG TABLET    Take 0.5 tablets (2.5 mg total) by mouth daily.   CARBAMAZEPINE (TEGRETOL) 200 MG TABLET    200 mg by PEG Tube route 3 (three) times daily.    LEVOFLOXACIN (LEVAQUIN) 750 MG TABLET    Take 1 tablet (750 mg total) by mouth daily.   LORAZEPAM (ATIVAN) 0.5 MG TABLET    0.5 mg by PEG Tube route 3 (three) times daily as needed for anxiety. ANXIETY    MELATONIN 1 MG TABS    Take 1 mg by mouth at bedtime as needed.   NUTRITIONAL SUPPLEMENTS (FEEDING SUPPLEMENT, OSMOLITE 1.5 CAL,) LIQD    Increase Osmolite 1.5 via feeding tube to 1.5 cans QID with 60 cc water before and after each bolus TF.  Add 240 cc free water TID between feedings as tolerated.   OMEPRAZOLE (PRILOSEC) 40 MG CAPSULE    40 mg by Feeding Tube route daily.   TAMSULOSIN (FLOMAX) 0.4 MG CAPS CAPSULE    Take 0.4 mg by mouth daily.   TRAMADOL (ULTRAM) 50 MG TABLET    Take 1 tablet (50 mg total) by mouth every 8 (eight) hours as needed.   TRAVOPROST, BAK FREE, (TRAVATAMN) 0.004 % SOLN OPHTHALMIC SOLUTION    Place 1 drop into both eyes at bedtime.  Modified Medications   No medications on file  Discontinued Medications   No medications on file    SIGNIFICANT DIAGNOSTIC EXAMS  05-17-13: chest x-ray: Right lower lobe airspace disease/pneumonia -radiographic follow-up to resolution is recommended. Pulmonary vascular congestion     LABS REVIEWED:   05-17-13: wbc 11.5; hgb 10.;4 hct 30.6; mcv 90.8; plt 399; glucose 150; bun 27; creat 1.10; k+4.6; na++136; ast 70 alt 70; alk phos 212; albumin 2.4 05-19-13: wbc 9.6; hgb 10.6; hct 31.2; mcv 91.0;plt 421; glucose 127; bun 23; creat  1.01; k+4.3; na++133      Review of Systems  Constitutional: Negative for malaise/fatigue.  Respiratory: Negative for cough, shortness of breath and wheezing.   Cardiovascular: Negative for chest pain, palpitations and leg swelling.  Gastrointestinal: Negative for heartburn, abdominal pain and constipation.       Has peg tube  Musculoskeletal: Negative for back pain, joint pain and myalgias.  Skin: Negative.   Neurological: Negative for dizziness and weakness.  Psychiatric/Behavioral: Negative for depression. The patient is not nervous/anxious.       Physical Exam  Constitutional: No distress.  thin  Neck: Neck supple. No JVD present.  Cardiovascular: Normal rate, regular rhythm and intact distal pulses.   Respiratory: Effort normal and breath sounds normal. No respiratory distress. He has no wheezes.  GI: Soft. Bowel sounds are normal. He exhibits no distension. There is no tenderness.  Peg tube present   Musculoskeletal: Normal range of motion. He exhibits no edema.  Is able to move all extremities; has generalized weakness present.   Neurological: He is alert.  Skin: Skin is warm and dry. He is not diaphoretic.  Psychiatric: He has a normal mood and affect.       ASSESSMENT/ PLAN:  1. Pneumonia: he continues on levaquin for a total of 6 days post hospitalization. His respiratory status is stable will not make changes and will monitor his status.   2. Hypertension; is stable will continue norvasc 5 mg daily and will monitor his status.   3. Trigeminal neuralgia: no complaints of pain present. Will continue tegretol 200 mg three times daily; ultram 50 mg every 8 hours as needed  and will monitor his status.   4. BPH: will continue flomax 0.4 mg daily  5. GERD: will continue prilosec 40 mg daily   6. Dysphagia: due to cancer of tongue; is npo; will continue tube feeding as directed; no signs of aspiration present; will monitor his status   7. Anxiety: is stable will  continue ativan 0.5 mg three times daily as needed and will monitor   8. Glaucoma: will continue travatan to both eyes at night time  Will repeat chest x-ray in 3 weeks   Time spent with patient 50 minutes     Ok Edwards NP [facility]  Contact [phone] Monday through Friday 8am- 5pm  After hours call [phone]

## 2013-05-22 NOTE — Telephone Encounter (Signed)
pt wife called to cx appt due to pt in nursing home for 23days....will call back to r/s once pt out

## 2013-05-23 ENCOUNTER — Other Ambulatory Visit: Payer: Medicare Other | Admitting: Lab

## 2013-05-23 ENCOUNTER — Other Ambulatory Visit: Payer: Medicare Other

## 2013-05-23 ENCOUNTER — Ambulatory Visit (HOSPITAL_COMMUNITY): Payer: Medicare Other

## 2013-05-24 ENCOUNTER — Non-Acute Institutional Stay (SKILLED_NURSING_FACILITY): Payer: Medicare Other | Admitting: Internal Medicine

## 2013-05-24 ENCOUNTER — Encounter: Payer: Self-pay | Admitting: Internal Medicine

## 2013-05-24 DIAGNOSIS — R531 Weakness: Secondary | ICD-10-CM

## 2013-05-24 DIAGNOSIS — I10 Essential (primary) hypertension: Secondary | ICD-10-CM

## 2013-05-24 DIAGNOSIS — J189 Pneumonia, unspecified organism: Secondary | ICD-10-CM

## 2013-05-24 DIAGNOSIS — N400 Enlarged prostate without lower urinary tract symptoms: Secondary | ICD-10-CM

## 2013-05-24 DIAGNOSIS — F411 Generalized anxiety disorder: Secondary | ICD-10-CM

## 2013-05-24 DIAGNOSIS — R5383 Other fatigue: Secondary | ICD-10-CM

## 2013-05-24 DIAGNOSIS — R1319 Other dysphagia: Secondary | ICD-10-CM

## 2013-05-24 DIAGNOSIS — R5381 Other malaise: Secondary | ICD-10-CM

## 2013-05-24 LAB — CULTURE, BLOOD (ROUTINE X 2)
CULTURE: NO GROWTH
Culture: NO GROWTH

## 2013-05-24 NOTE — Progress Notes (Signed)
Patient ID: Jose [name], male   DOB: [birth year], 80 y.o.   MRN: [id number]     [facility]   PCP: Nyoka Cowden, MD  Code Status: DNR  Allergies  Allergen Reactions  . Hydromorphone Swelling and Other (See Comments)    phlebitis  . Morphine And Related Other (See Comments)    Phlebitis, makes pt out of it   . Codeine Other (See Comments)    Dizzy, mental status changes    Chief Complaint: new admit  HPI:  80 y/o male patient is here for STR after hospital admission from 05/17/13- 05/21/13 with pneumonia. He has history of cancer at base of the tongue and is s/p hemo/ radiation and surgery. He has a peg tubes/p esophageal stricture. He was started on vancomycin and cefepime for HCAP and clinically improved. He was then switched to levofloxacin. With his generalized weakness, he was sent to SNF for rehabilitation. He was seen in his room today. He appears frail but in no distress. He denies any complaints. No concern from staff  Review of Systems:  Constitutional: Negative for fever, chills, and diaphoresis. fatigue present HENT: Negative for congestion, hearing loss and sore throat.   Eyes: Negative for eye pain, blurred vision, double vision and discharge.  Respiratory: Negative for wheezing.  gets short of breath with exertion and has cough Cardiovascular: Negative for chest pain, palpitations, orthopnea and leg swelling.  Gastrointestinal: Negative for heartburn, nausea, vomiting, abdominal pain, diarrhea and constipation.  Genitourinary: Negative for dysuria Musculoskeletal: Negative for back pain, falls, joint pain  Skin: Negative for itching and rash.  Neurological: Negative for dizziness, tingling, focal weakness and headaches. has weakness Psychiatric/Behavioral: denies insomnia or depression   Past Medical History  Diagnosis Date  . Blood transfusion     2006 had 4 units  . Arthritis     knees,back,shoulders  . Anxiety   . Right ureteral  stone S/P URETERAL STENT 01-24-11  AND ESWL  03-01-11  . History of acute renal failure 01-23-11    DUE TO HYDRONEPHROSIS AND RIGHT STONE  . Normal cardiac stress test 09-25-2007  . Glaucoma   . Cataract immature   . Diverticulosis of colon   . History of GI diverticular bleed   . Hyperlipemia   . Impaired hearing NOT WEARING HIS BILATERL AIDS  . Urgency of urination     DUE TO URETERAL STONE AND STENT  . Frequency of urination   . Insomnia   . Anemia   . Chronic kidney disease     kidney stones  . GERD (gastroesophageal reflux disease)   . Hernia     inguinal  . Headache(784.0)     migraines  . Trigeminal neuralgia   . Hypertension   . History of radiation therapy 06/21/11-08/04/11    tongue/h/n 69.96 gy in 33 fxs  . History of chemotherapy 07/26/11    taxol/carbo completed/stopped 07/26/11  . 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 G tube feedings   . History of radiation therapy 06/21/11-08/04/11    base of tongue  . Male breast cancer 1988    S/P RIGHT MASTECTOMY W/ NODE DISSECTION AND CHEMO  . 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 Skin cancer 2002    melanoma- back  . Colon polyps   . Diverticulosis    Past Surgical History  Procedure Laterality Date  . Cystoscopy w/ ureteral stent placement  01/24/2011    Procedure: CYSTOSCOPY WITH  RETROGRADE PYELOGRAM/URETERAL STENT PLACEMENT;  Surgeon: Antony Haste, MD;  Location: [facility];  Service: Urology;  Laterality: Right;  . Left leg surg for fx  AGE 69    patient states left thigh  . Craniectomy suboccipital for exploration / decompression cranial nerves  2003    5TH CRANIAL NERVE DECOMPRESSION  . Simple mastectomy  1988    MALE--- RIGHT BREAST W/ NODE DISSECTION  . Melanoma excision  10 YRS AGO    BACK AREA  . Extracorporeal shock wave lithotripsy  03-01-11    RIGHT  . Ureteroscopy  03/26/2011    Procedure: URETEROSCOPY;  Surgeon: Antony Haste, MD;  Location: [facility];  Service:  Urology;  Laterality: Right;  RIGHT URETEROSCOPY, HOLMIUM LASER AND STENT   . Examination under anesthesia  03/26/2011    Procedure: EXAM UNDER ANESTHESIA;  Surgeon: Antony Haste, MD;  Location: [facility];  Service: Urology;  Laterality: N/A;  . Lymph node biopsy  04/21/2011    Procedure: LYMPH NODE BIOPSY;  Surgeon: Susy Frizzle, MD;  Location: [facility];  Service: ENT;  Laterality: Right;  . Gastrostomy tube placement    . Esophagoscopy with dilitation N/A 02/14/2013    Procedure: ESOPHAGOSCOPY WITH DILITATION;  Surgeon: Serena Colonel, MD;  Location: [facility] OR;  Service: ENT;  Laterality: N/A;  Attempted  . Direct laryngoscopy  02/14/2013    Procedure: DIRECT LARYNGOSCOPY;  Surgeon: Serena Colonel, MD;  Location: [facility] OR;  Service: ENT;;  . Esophageal dilation  05/01/13    tread method esophageal dilation - Dr. Wendall Mola  . Diagnostic laproscopy  05/03/13    Dr. Jerene Canny at [facility]   Social History:   reports that he quit smoking about 32 years ago. His smoking use included Cigarettes and Cigars. He has a 7.5 pack-year smoking history. He has never used smokeless tobacco. He reports that he does not drink alcohol or use illicit drugs.  Family History  Problem Relation Age of Onset  . Heart disease Mother   . Heart disease Father   . Cancer Sister     "perineum"  . Stomach cancer Paternal Grandmother     Medications: Patient's Medications  New Prescriptions   No medications on file  Previous Medications   AMLODIPINE (NORVASC) 5 MG TABLET    Take 0.5 tablets (2.5 mg total) by mouth daily.   CARBAMAZEPINE (TEGRETOL) 200 MG TABLET    200 mg by PEG Tube route 3 (three) times daily.    DOXYLAMINE SUCCINATE, SLEEP, (SLEEP AID PO)    2 capsules by PEG Tube route at bedtime as needed (sleep).   LEVOFLOXACIN (LEVAQUIN) 750 MG TABLET    Take 1 tablet (750 mg total) by mouth daily.   LORAZEPAM (ATIVAN) 0.5 MG TABLET    0.5 mg by PEG Tube route  3 (three) times daily as needed for anxiety. ANXIETY    MELATONIN 1 MG TABS    Take 1 mg by mouth at bedtime as needed.   NUTRITIONAL SUPPLEMENTS (FEEDING SUPPLEMENT, OSMOLITE 1.5 CAL,) LIQD    Increase Osmolite 1.5 via feeding tube to 1.5 cans QID with 60 cc water before and after each bolus TF.  Add 240 cc free water TID between feedings as tolerated.   OMEPRAZOLE (PRILOSEC) 40 MG CAPSULE    40 mg by Feeding Tube route daily.   TAMSULOSIN (FLOMAX) 0.4 MG CAPS CAPSULE    Take 0.4 mg by mouth daily.  TRAMADOL (ULTRAM) 50 MG TABLET    Take 1 tablet (50 mg total) by mouth every 8 (eight) hours as needed.   TRAVOPROST, BAK FREE, (TRAVATAMN) 0.004 % SOLN OPHTHALMIC SOLUTION    Place 1 drop into both eyes at bedtime.  Modified Medications   No medications on file  Discontinued Medications   No medications on file     Physical Exam: BP 140/88  Pulse 82  Temp(Src) 99.2 F (37.3 C)  Resp 18  SpO2 95%   General- elderly frail male in no acute distress Head- atraumatic, normocephalic Eyes- PERRLA, EOMI, no pallor Neck- neck supple, no jugular vein distension Cardiovascular- normal s1,s2, no murmurs/ rubs/ gallops Respiratory- bilateral decreased air entry right > left , no rhonchi or wheeze or crackles, no use of accessory muscles Abdomen- bowel sounds present, soft, non tender, peg tube in place and site clean Musculoskeletal- able to move all 4 extremities, no edema, generalized weakness present, on wheelchair Neurological- no focal deficit Skin- warm and dry Psychiatry- alert and oriented to person, place and time, normal mood and affect  Labs reviewed: Basic Metabolic Panel:  Recent Labs  02/08/13 1349 05/17/13 2036 05/18/13 0416 05/19/13 0542  NA 141 136*  --  133*  K 4.1 4.6  --  4.3  CL 105 99  --  100  CO2 29 24  --  22  GLUCOSE 137* 150*  --  127*  BUN 27* 27*  --  23  CREATININE 1.27 1.10 1.07 1.01  CALCIUM 9.0 9.1  --  8.6   Liver Function Tests:  Recent  Labs  06/06/12 1041 12/08/12 0921 05/17/13 2036  AST 21 18 70*  ALT 23 16 79*  ALKPHOS 98 92 212*  BILITOT 0.45 0.72 0.6  PROT 6.9 7.2 6.5  ALBUMIN 3.3* 3.7 2.4*    Recent Labs  05/17/13 2036  LIPASE 65*   No results found for this basename: AMMONIA,  in the last 8760 hours CBC:  Recent Labs  06/06/12 1041 12/08/12 0920  05/17/13 2036 05/18/13 0416 05/19/13 0542  WBC 5.2 4.9  < > 11.5* 11.1* 9.6  NEUTROABS 4.0 3.6  --  9.3*  --   --   HGB 13.8 14.6  < > 10.4* 11.0* 10.6*  HCT 40.1 42.9  < > 30.6* 32.6* 31.2*  MCV 90.3 90.8  < > 90.8 91.6 91.0  PLT 210 209  < > 399 396 421*  < > = values in this interval not displayed. Cardiac Enzymes:  Recent Labs  05/17/13 2036  TROPONINI <0.30    Radiological Exams: Dg Chest 2 View  05/17/2013   CLINICAL DATA:  80 year old male with chest pain, shortness of breath congestion.  EXAM: CHEST  2 VIEW  COMPARISON:  02/08/2013  FINDINGS: Upper limits normal heart size identified.  Pulmonary vascular congestion is identified.  Right lower lobe airspace opacity is suspicious for pneumonia.  There is no evidence of pleural effusion or pneumothorax.  Surgical clips within the right axilla are again noted.  IMPRESSION: Right lower lobe airspace disease/pneumonia -radiographic follow-up to resolution is recommended.  Pulmonary vascular congestion   Electronically Signed   By: Hassan Rowan M.D.   On: 05/17/2013 20:29    Assessment/Plan  HCAP- improved. Continue levaquin and complete the course. Repeat cxr in 2 weeks of completion of antibiotic to assess for resolution. He is not strong to use incentive spirometer or use inhaler. Will have him on duoneb q8h for 5 days to help with  his airways and then reassess  Generalized weakness- here for STR, will have him work with PT, OT and SLP team for strengthening exercises. Monitor nutritional status. Fall precautions. Skin care  Dysphagia- has cancer of tongue, on peg tube feed, continue nutritional  supplement, is NPO  Anxiety- stable on ativan 0.5 mg tid prn  Hypertension- continue norvasc 5 mg daily , check bmp, monitor bp readings  GERD- stable, continue prilosec  BPH- stable on flomax   Family/ staff Communication: reviewed care plan with patient and nursing supervisor  Goals of care: STR   Labs/tests ordered: cbc, bmp    Blanchie Serve, MD  [facility] [phone] (Monday-Friday 8 am - 5 pm) [phone] (afterhours)

## 2013-05-30 ENCOUNTER — Ambulatory Visit: Payer: Medicare Other | Admitting: Hematology and Oncology

## 2013-06-01 ENCOUNTER — Ambulatory Visit: Payer: Medicare Other | Admitting: Hematology and Oncology

## 2013-06-07 ENCOUNTER — Non-Acute Institutional Stay (SKILLED_NURSING_FACILITY): Payer: Medicare Other | Admitting: Adult Health

## 2013-06-07 DIAGNOSIS — J189 Pneumonia, unspecified organism: Secondary | ICD-10-CM

## 2013-06-07 DIAGNOSIS — C01 Malignant neoplasm of base of tongue: Secondary | ICD-10-CM

## 2013-06-07 DIAGNOSIS — I10 Essential (primary) hypertension: Secondary | ICD-10-CM

## 2013-06-07 DIAGNOSIS — R1319 Other dysphagia: Secondary | ICD-10-CM

## 2013-06-07 DIAGNOSIS — R627 Adult failure to thrive: Secondary | ICD-10-CM

## 2013-06-12 ENCOUNTER — Telehealth: Payer: Self-pay

## 2013-06-12 NOTE — Telephone Encounter (Signed)
Called Elmyra Ricks with [facility], gave verbal order for Home Health, Nursing 1 x a week for 5 weeks. Elmyra Ricks verbalized understanding.

## 2013-06-12 NOTE — Telephone Encounter (Signed)
Elmyra Ricks from [facility] called to make aware that pt was admitted to [facility] today.  Frequency of 1 week x 5 for nursing.  Needs verbal order.  Pls call.

## 2013-06-14 ENCOUNTER — Telehealth: Payer: Self-pay | Admitting: *Deleted

## 2013-06-14 NOTE — Telephone Encounter (Signed)
Wife called to get missed office visit from earlier this month r/s.  States pt out of nursing home now and she already r/s his CT scan to April 3rd.  Informed wife I will send order to have Scheduling get pt r/s to after the CT scan and also add lab for same day as CT.  She verbalized understanding.

## 2013-06-15 ENCOUNTER — Telehealth: Payer: Self-pay | Admitting: *Deleted

## 2013-06-15 ENCOUNTER — Telehealth: Payer: Self-pay | Admitting: Hematology and Oncology

## 2013-06-15 MED ORDER — JEVITY 1.5 CAL/FIBER PO LIQD: ORAL | Status: DC

## 2013-06-15 MED ORDER — OSMOLITE 1.5 CAL PO LIQD: ORAL | Status: DC

## 2013-06-15 NOTE — Telephone Encounter (Signed)
Wife states she s/w our [place] about pt having diarrhea and she recommended adding Fiber to his feedings.  She says [facility] needs an order from Dr. Alvy Bimler for the change in feeding.  Discussed w/ Barb and entered order for Jevity Fiber to alternate with Osmolite.  See Med list.  Juliann Mule with [facility] and left VM informing of new order and to please call nurse back to confirm.  Wife also requests Handicap Placard.  Form completed and signed by Dr. Alvy Bimler.

## 2013-06-15 NOTE — Telephone Encounter (Signed)
s.w. pt and wife and advised on April appts ...ok and aware

## 2013-06-15 NOTE — Telephone Encounter (Signed)
VM from [facility] confirming they received order for new tube feeding.  Notified wife of order sent to [facility] and explained new feeding instructions.  Also informed of handicap form signed by Dr. Alvy Bimler ready to pick up at front desk.  She verbalized understanding.

## 2013-06-16 ENCOUNTER — Encounter: Payer: Self-pay | Admitting: Adult Health

## 2013-06-16 NOTE — Progress Notes (Signed)
Patient ID: Jose [name], male   DOB: [birth year], 80 y.o.   MRN: [id number]     [facility]   Allergies  Allergen Reactions  . Hydromorphone Swelling and Other (See Comments)    phlebitis  . Morphine And Related Other (See Comments)    Phlebitis, makes pt out of it   . Codeine Other (See Comments)    Dizzy, mental status changes      Chief Complaint  Patient presents with  . Discharge Note    HPI:  He is being discharged to home with home health for pt/otnursing/aid. He will not need dme; he already has needed equipment. He will need prescriptions to be written. His strength has improved since his admission to this facility.     Past Medical History  Diagnosis Date  . Blood transfusion     2006 had 4 units  . Arthritis     knees,back,shoulders  . Anxiety   . Right ureteral stone S/P URETERAL STENT 01-24-11  AND ESWL  03-01-11  . History of acute renal failure 01-23-11    DUE TO HYDRONEPHROSIS AND RIGHT STONE  . Normal cardiac stress test 09-25-2007  . Glaucoma   . Cataract immature   . Diverticulosis of colon   . History of GI diverticular bleed   . Hyperlipemia   . Impaired hearing NOT WEARING HIS BILATERL AIDS  . Urgency of urination     DUE TO URETERAL STONE AND STENT  . Frequency of urination   . Insomnia   . Anemia   . Chronic kidney disease     kidney stones  . GERD (gastroesophageal reflux disease)   . Hernia     inguinal  . Headache(784.0)     migraines  . Trigeminal neuralgia   . Hypertension   . History of radiation therapy 06/21/11-08/04/11    tongue/h/n 69.96 gy in 33 fxs  . History of chemotherapy 07/26/11    taxol/carbo completed/stopped 07/26/11  . 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 G tube feedings   . History of radiation therapy 06/21/11-08/04/11    base of tongue  . Male breast cancer 1988    S/P RIGHT MASTECTOMY W/ NODE DISSECTION AND CHEMO  . 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 Skin cancer 2002    melanoma- back  .  Colon polyps   . Diverticulosis     Past Surgical History  Procedure Laterality Date  . Cystoscopy w/ ureteral stent placement  01/24/2011    Procedure: CYSTOSCOPY WITH RETROGRADE PYELOGRAM/URETERAL STENT PLACEMENT;  Surgeon: Fredricka Bonine, MD;  Location: [facility];  Service: Urology;  Laterality: Right;  . Left leg surg for fx  AGE 26    patient states left thigh  . Craniectomy suboccipital for exploration / decompression cranial nerves  2003    5TH CRANIAL NERVE DECOMPRESSION  . Simple mastectomy  1988    MALE--- RIGHT BREAST W/ NODE DISSECTION  . Melanoma excision  10 YRS AGO    BACK AREA  . Extracorporeal shock wave lithotripsy  03-01-11    RIGHT  . Ureteroscopy  03/26/2011    Procedure: URETEROSCOPY;  Surgeon: Fredricka Bonine, MD;  Location: [facility];  Service: Urology;  Laterality: Right;  RIGHT URETEROSCOPY, HOLMIUM LASER AND STENT   . Examination under anesthesia  03/26/2011    Procedure: EXAM UNDER ANESTHESIA;  Surgeon: Fredricka Bonine, MD;  Location: [facility];  Service: Urology;  Laterality: N/A;  . Lymph node biopsy  04/21/2011    Procedure: LYMPH NODE BIOPSY;  Surgeon: Beckie Salts, MD;  Location: [place];  Service: ENT;  Laterality: Right;  . Gastrostomy tube placement    . Esophagoscopy with dilitation N/A 02/14/2013    Procedure: ESOPHAGOSCOPY WITH DILITATION;  Surgeon: Izora Gala, MD;  Location: [place];  Service: ENT;  Laterality: N/A;  Attempted  . Direct laryngoscopy  02/14/2013    Procedure: DIRECT LARYNGOSCOPY;  Surgeon: Izora Gala, MD;  Location: [facility];  Service: ENT;;  . Esophageal dilation  05/01/13    tread method esophageal dilation - Dr. Fredricka Bonine  . Diagnostic laproscopy  05/03/13    Dr. Margarette Canada at [place] BP 119/70  Pulse 70  Ht _0  (1.778 m)  Wt 149 lb (67.586 kg)  BMI 21.38 kg/m2   Patient's Medications  New Prescriptions   No  medications on file  Previous Medications   AMLODIPINE (NORVASC) 5 MG TABLET    Take 0.5 tablets (2.5 mg total) by mouth daily.   CARBAMAZEPINE (TEGRETOL) 200 MG TABLET    200 mg by PEG Tube route 3 (three) times daily.    DOXYLAMINE SUCCINATE, SLEEP, (SLEEP AID PO)    2 capsules by PEG Tube route at bedtime as needed (sleep).   LORAZEPAM (ATIVAN) 0.5 MG TABLET    0.5 mg by PEG Tube route 3 (three) times daily as needed for anxiety. ANXIETY    MELATONIN 1 MG TABS    Take 1 mg by mouth at bedtime as needed.   NUTRITIONAL SUPPLEMENTS (FEEDING SUPPLEMENT, JEVITY 1.5 CAL/FIBER,) LIQD    1.5 cans BID (alternate with Osmolite BID for four feedings daily) via PEG tube.  Flush with 60 cc water before and after each feeding. 240 cc Free water TID between feedings as tolerated.   NUTRITIONAL SUPPLEMENTS (FEEDING SUPPLEMENT, OSMOLITE 1.5 CAL,) LIQD    1.5 cans BID (alternate with Jevity Fiber BID for four feedings daily) via PEG tube. Flush with 60 cc water before and after each feeding.  240 cc Free Water TID between feedings as tolerated.   OMEPRAZOLE (PRILOSEC) 40 MG CAPSULE    40 mg by Feeding Tube route daily.   TAMSULOSIN (FLOMAX) 0.4 MG CAPS CAPSULE    Take 0.4 mg by mouth daily.   TRAMADOL (ULTRAM) 50 MG TABLET    Take 1 tablet (50 mg total) by mouth every 8 (eight) hours as needed.   TRAVOPROST, BAK FREE, (TRAVATAMN) 0.004 % SOLN OPHTHALMIC SOLUTION    Place 1 drop into both eyes at bedtime.  Modified Medications   No medications on file  Discontinued Medications   LEVOFLOXACIN (LEVAQUIN) 750 MG TABLET    Take 1 tablet (750 mg total) by mouth daily.    SIGNIFICANT DIAGNOSTIC EXAMS  05-17-13: chest x-ray: Right lower lobe airspace disease/pneumonia -radiographic follow-up to resolution is recommended. Pulmonary vascular congestion     LABS REVIEWED:   05-17-13: wbc 11.5; hgb 10.;4 hct 30.6; mcv 90.8; plt 399; glucose 150; bun 27; creat 1.10; k+4.6; na++136; ast 70 alt 70; alk phos 212;  albumin 2.4 05-19-13: wbc 9.6; hgb 10.6; hct 31.2; mcv 91.0;plt 421; glucose 127; bun 23; creat 1.01; k+4.3; na++133      Review of Systems  Constitutional: Negative for malaise/fatigue.  Respiratory: Negative for cough, shortness of breath and wheezing.   Cardiovascular: Negative for chest pain, palpitations and leg swelling.  Gastrointestinal: Negative  for heartburn, abdominal pain and constipation.       Has peg tube  Musculoskeletal: Negative for back pain, joint pain and myalgias.  Skin: Negative.   Neurological: Negative for dizziness and weakness.  Psychiatric/Behavioral: Negative for depression. The patient is not nervous/anxious.       Physical Exam  Constitutional: No distress.  thin  Neck: Neck supple. No JVD present.  Cardiovascular: Normal rate, regular rhythm and intact distal pulses.   Respiratory: Effort normal and breath sounds normal. No respiratory distress. He has no wheezes.  GI: Soft. Bowel sounds are normal. He exhibits no distension. There is no tenderness.  Peg tube present   Musculoskeletal: Normal range of motion. He exhibits no edema.  Is able to move all extremities; Neurological: He is alert.  Skin: Skin is warm and dry. He is not diaphoretic.  Psychiatric: He has a normal mood and affect.      ASSESSMENT/ PLAN:  Will discharge to home with home heath for pt/ot/nursing/aid. Will not need dme. His prescriptions have been written.   Time spent with patient 40 minutes.        Ok Edwards NP [facility]  Contact [phone] Monday through Friday 8am- 5pm  After hours call [phone]

## 2013-06-20 ENCOUNTER — Telehealth: Payer: Self-pay | Admitting: Internal Medicine

## 2013-06-20 NOTE — Telephone Encounter (Signed)
[employer] called to get orders for pt or get him scheduled somewhere to get his tube feeding changed out. cheryl states wife is having problems w/ it, its taped up and coming our. Can you call as soon as possible in the am?

## 2013-06-21 NOTE — Telephone Encounter (Signed)
Called and left detailed message for Malachy Mood that orders were received on 3/27 to [facility] from [facility] according to chart. We are not taking care of pt's tube feeding.

## 2013-06-21 NOTE — Telephone Encounter (Signed)
Spoke to pt told him needs to contact Oncologist regarding tube feeding. Pt verbalized understanding.

## 2013-06-22 ENCOUNTER — Telehealth: Payer: Self-pay

## 2013-06-22 ENCOUNTER — Other Ambulatory Visit (HOSPITAL_BASED_OUTPATIENT_CLINIC_OR_DEPARTMENT_OTHER): Payer: Medicare Other

## 2013-06-22 ENCOUNTER — Encounter (HOSPITAL_COMMUNITY): Payer: Self-pay

## 2013-06-22 ENCOUNTER — Telehealth: Payer: Self-pay | Admitting: *Deleted

## 2013-06-22 ENCOUNTER — Ambulatory Visit (HOSPITAL_COMMUNITY): Payer: Medicare Other

## 2013-06-22 ENCOUNTER — Ambulatory Visit (HOSPITAL_COMMUNITY)
Admission: RE | Admit: 2013-06-22 | Discharge: 2013-06-22 | Disposition: A | Discharge status: Home / Self Care | Payer: Medicare Other | Source: Ambulatory Visit | Attending: Hematology and Oncology | Admitting: Hematology and Oncology

## 2013-06-22 DIAGNOSIS — C01 Malignant neoplasm of base of tongue: Secondary | ICD-10-CM

## 2013-06-22 DIAGNOSIS — K8020 Calculus of gallbladder without cholecystitis without obstruction: Secondary | ICD-10-CM | POA: Insufficient documentation

## 2013-06-22 DIAGNOSIS — Z9221 Personal history of antineoplastic chemotherapy: Secondary | ICD-10-CM | POA: Insufficient documentation

## 2013-06-22 DIAGNOSIS — Z923 Personal history of irradiation: Secondary | ICD-10-CM | POA: Insufficient documentation

## 2013-06-22 DIAGNOSIS — N200 Calculus of kidney: Secondary | ICD-10-CM | POA: Insufficient documentation

## 2013-06-22 DIAGNOSIS — I2584 Coronary atherosclerosis due to calcified coronary lesion: Secondary | ICD-10-CM | POA: Insufficient documentation

## 2013-06-22 DIAGNOSIS — R911 Solitary pulmonary nodule: Secondary | ICD-10-CM | POA: Insufficient documentation

## 2013-06-22 LAB — CBC WITH DIFFERENTIAL/PLATELET
BASO%: 1.3 % (ref 0.0–2.0)
Basophils Absolute: 0.1 10*3/uL (ref 0.0–0.1)
EOS ABS: 0 10*3/uL (ref 0.0–0.5)
EOS%: 0.5 % (ref 0.0–7.0)
HCT: 37.8 % — ABNORMAL LOW (ref 38.4–49.9)
HGB: 12.4 g/dL — ABNORMAL LOW (ref 13.0–17.1)
LYMPH%: 11.5 % — ABNORMAL LOW (ref 14.0–49.0)
MCH: 31 pg (ref 27.2–33.4)
MCHC: 32.7 g/dL (ref 32.0–36.0)
MCV: 94.9 fL (ref 79.3–98.0)
MONO#: 0.4 10*3/uL (ref 0.1–0.9)
MONO%: 9.1 % (ref 0.0–14.0)
NEUT%: 77.6 % — ABNORMAL HIGH (ref 39.0–75.0)
NEUTROS ABS: 3.4 10*3/uL (ref 1.5–6.5)
PLATELETS: 236 10*3/uL (ref 140–400)
RBC: 3.98 10*6/uL — AB (ref 4.20–5.82)
RDW: 14.6 % (ref 11.0–14.6)
WBC: 4.4 10*3/uL (ref 4.0–10.3)
lymph#: 0.5 10*3/uL — ABNORMAL LOW (ref 0.9–3.3)

## 2013-06-22 LAB — COMPREHENSIVE METABOLIC PANEL (CC13)
ALBUMIN: 3.3 g/dL — AB (ref 3.5–5.0)
ALK PHOS: 95 U/L (ref 40–150)
ALT: 17 U/L (ref 0–55)
ANION GAP: 9 meq/L (ref 3–11)
AST: 18 U/L (ref 5–34)
BUN: 21.1 mg/dL (ref 7.0–26.0)
CO2: 24 meq/L (ref 22–29)
Calcium: 8.8 mg/dL (ref 8.4–10.4)
Chloride: 104 mEq/L (ref 98–109)
Creatinine: 1.1 mg/dL (ref 0.7–1.3)
Glucose: 136 mg/dl (ref 70–140)
POTASSIUM: 4.6 meq/L (ref 3.5–5.1)
SODIUM: 137 meq/L (ref 136–145)
TOTAL PROTEIN: 6.4 g/dL (ref 6.4–8.3)
Total Bilirubin: 0.34 mg/dL (ref 0.20–1.20)

## 2013-06-22 LAB — T4, FREE: Free T4: 0.78 ng/dL — ABNORMAL LOW (ref 0.80–1.80)

## 2013-06-22 LAB — TSH CHCC: TSH: 2.119 m(IU)/L (ref 0.320–4.118)

## 2013-06-22 NOTE — Telephone Encounter (Signed)
Opened telephone note in error  

## 2013-06-22 NOTE — Telephone Encounter (Signed)
Spoke with [employer]. technician.  She states that Jose [name] is stating that his Peg tube has been sore ~a month at the insertion site.  He states that the site is reddened.  He is afebrile.   Told Jose [name] to Suggest to his wife that she  call the physician who put the tube in.  The tube has been in place ~2 months.  There is no mechanical problem with the tube.  Feeds are able to pass through the tube with out problems.  Jose [name] verbalized understanding.

## 2013-06-26 ENCOUNTER — Telehealth: Payer: Self-pay | Admitting: Internal Medicine

## 2013-06-26 NOTE — Telephone Encounter (Signed)
Wes Reynolds called from [place] for verbal orders;;  physical therapy twice a week for six weeks   [phone]

## 2013-06-27 NOTE — Telephone Encounter (Signed)
Okay to give orders? 

## 2013-06-27 NOTE — Telephone Encounter (Signed)
Called Wes with [facility] and gave verbal orders for physical therapy twice a week for six weeks for pt. Wes verbalized understanding.

## 2013-06-27 NOTE — Telephone Encounter (Signed)
ok 

## 2013-06-28 ENCOUNTER — Telehealth: Payer: Self-pay | Admitting: Hematology and Oncology

## 2013-06-28 ENCOUNTER — Encounter: Payer: Self-pay | Admitting: Hematology and Oncology

## 2013-06-28 ENCOUNTER — Ambulatory Visit (HOSPITAL_BASED_OUTPATIENT_CLINIC_OR_DEPARTMENT_OTHER): Payer: Medicare Other | Admitting: Hematology and Oncology

## 2013-06-28 VITALS — BP 135/64 | HR 59 | Temp 98.1°F | Resp 18 | Ht 70.0 in | Wt 151.9 lb

## 2013-06-28 DIAGNOSIS — R627 Adult failure to thrive: Secondary | ICD-10-CM

## 2013-06-28 DIAGNOSIS — R1310 Dysphagia, unspecified: Secondary | ICD-10-CM

## 2013-06-28 DIAGNOSIS — Z853 Personal history of malignant neoplasm of breast: Secondary | ICD-10-CM

## 2013-06-28 DIAGNOSIS — E46 Unspecified protein-calorie malnutrition: Secondary | ICD-10-CM

## 2013-06-28 DIAGNOSIS — C01 Malignant neoplasm of base of tongue: Secondary | ICD-10-CM

## 2013-06-28 DIAGNOSIS — C778 Secondary and unspecified malignant neoplasm of lymph nodes of multiple regions: Secondary | ICD-10-CM

## 2013-06-28 DIAGNOSIS — D649 Anemia, unspecified: Secondary | ICD-10-CM

## 2013-06-28 NOTE — Progress Notes (Signed)
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 He received definitive concurrent chemoradiation with weekly Carboplatin/Taxol on 06/14/2011; and daily XRT 06/21/11. Chemotherapy completed on 07/26/11 (last dose held due to fatigue, anorexia, and mucositis). Radiation completed on 08/04/11. The patient also has background history of breast cancer who was treated with surgery and tamoxifen. He denies any palpable chest wall mass. INTERVAL HISTORY: NEO YEPIZ 80 y.o. male returns for routine followup, due to prior history of locally invasive tongue cancer. His symptoms remained the same. He still has problems swallowing food. He felt significant difficulties getting food down the feeding tube and sometimes it can cause some choking sensation. He is able to swallow saliva. He still has mild persistent dry mouth. He is 100% dependent on the feeding tube. Is not doing any tongue exercises. He has early satiety and occasional nausea.   The patient also have recurrent admission to the hospital with problems with his feeding tube. He also just recover from healthcare associated pneumonia.  I have reviewed the past medical history, past surgical history, social history and family history with the patient and they are unchanged from previous note.  ALLERGIES:  is allergic to hydromorphone; morphine and related; and codeine.  MEDICATIONS:  Current Outpatient Prescriptions  Medication Sig Dispense Refill  . amLODipine (NORVASC) 5 MG tablet Take 0.5 tablets (2.5 mg total) by  mouth daily.  30 tablet  2  . carbamazepine (TEGRETOL) 200 MG tablet 200 mg by PEG Tube route 3 (three) times daily.       . Doxylamine Succinate, Sleep, (SLEEP AID PO) 2 capsules by PEG Tube route at bedtime as needed (sleep).      . LORazepam (ATIVAN) 0.5 MG tablet 0.5 mg by PEG Tube route 3 (three) times daily as needed for anxiety. ANXIETY       . Melatonin 1 MG TABS Take 1 mg by mouth at bedtime as needed.      . Nutritional Supplements (FEEDING SUPPLEMENT, JEVITY 1.5 CAL/FIBER,) LIQD 1.5 cans BID (alternate with Osmolite BID for four feedings daily) via PEG tube.  Flush with 60 cc water before and after each feeding. 240 cc Free water TID between feedings as tolerated.  711 mL    . Nutritional Supplements (FEEDING SUPPLEMENT, OSMOLITE 1.5 CAL,) LIQD 1.5 cans BID (alternate with Jevity Fiber BID for four feedings daily) via PEG tube. Flush with 60 cc water before and after each feeding.  240 cc Free Water TID between feedings as tolerated.  711 mL  0  . omeprazole (PRILOSEC) 40 MG capsule 40 mg by Feeding Tube route daily.      . tamsulosin (FLOMAX) 0.4 MG CAPS capsule Take 0.4 mg by mouth daily.      . traMADol (ULTRAM) 50 MG tablet Take 1 tablet (50 mg total) by mouth every 8 (eight) hours as needed.  30 tablet  0  . Travoprost, BAK Free, (TRAVATAMN) 0.004 % SOLN ophthalmic solution Place 1 drop into both eyes at bedtime.       No current facility-administered medications for this visit.   Facility-Administered Medications Ordered in Other Visits  Medication Dose Route Frequency Provider Last Rate Last Dose  . topical emolient (BIAFINE) emulsion   Topical PRN Marye Round, MD        REVIEW OF SYSTEMS:   Constitutional: Denies fevers, chills or abnormal weight loss Eyes: Denies blurriness of vision Ears, nose, mouth, throat, and face: Denies mucositis or sore throat Respiratory: Denies cough, dyspnea or wheezes Cardiovascular: Denies palpitation, chest discomfort or lower extremity  swelling Gastrointestinal:  Denies nausea, heartburn or change in bowel habits Skin: Denies abnormal skin rashes Lymphatics: Denies new lymphadenopathy or easy bruising Neurological:Denies numbness, tingling or new weaknesses Behavioral/Psych: Mood is stable, no new changes  All other systems were reviewed with the patient and are negative.  PHYSICAL EXAMINATION: ECOG PERFORMANCE STATUS: 2 - Symptomatic, <50% confined to bed  Filed Vitals:   06/28/13 1449  BP: 135/64  Pulse: 59  Temp: 98.1 F (36.7 C)  Resp: 18   Filed Weights   06/28/13 1449  Weight: 151 lb 14.4 oz (68.901 kg)    GENERAL:alert, no distress and comfortable. He looked elderly SKIN: skin color, texture, turgor are normal, no rashes or significant lesions EYES: normal, Conjunctiva are pink and non-injected, sclera clear OROPHARYNX:no exudate, no erythema and lips, buccal mucosa, and tongue normal  NECK: Significant radiation-induced skin fibrosis. LYMPH:  no palpable lymphadenopathy in the cervical, axillary or inguinal LUNGS: clear to auscultation and percussion with normal breathing effort HEART: regular rate & rhythm and no murmurs and no lower extremity edema ABDOMEN:abdomen soft, non-tender and normal bowel sounds. He has a weard looking feeding tube in situ Musculoskeletal:no cyanosis of digits and no clubbing  NEURO: alert & oriented x 3 with fluent speech, no focal motor/sensory deficits  LABORATORY DATA:  I have reviewed the data as listed    Component Value Date/Time   NA 137 06/22/2013 1343   NA 133* 05/19/2013 0542   K 4.6 06/22/2013 1343   K 4.3 05/19/2013 0542   CL 100 05/19/2013 0542   CL 106 06/06/2012 1041   CO2 24 06/22/2013 1343   CO2 22 05/19/2013 0542   GLUCOSE 136 06/22/2013 1343   GLUCOSE 127* 05/19/2013 0542   GLUCOSE 148* 06/06/2012 1041   BUN 21.1 06/22/2013 1343   BUN 23 05/19/2013 0542   CREATININE 1.1 06/22/2013 1343   CREATININE 1.01 05/19/2013 0542   CALCIUM 8.8 06/22/2013 1343   CALCIUM  8.6 05/19/2013 0542   PROT 6.4 06/22/2013 1343   PROT 6.5 05/17/2013 2036   ALBUMIN 3.3* 06/22/2013 1343   ALBUMIN 2.4* 05/17/2013 2036   AST 18 06/22/2013 1343   AST 70* 05/17/2013 2036   ALT 17 06/22/2013 1343   ALT 79* 05/17/2013 2036   ALKPHOS 95 06/22/2013 1343   ALKPHOS 212* 05/17/2013 2036   BILITOT 0.34 06/22/2013 1343   BILITOT 0.6 05/17/2013 2036   GFRNONAA 67* 05/19/2013 0542   GFRAA 77* 05/19/2013 0542    No results found for this basename: SPEP,  UPEP,   kappa and lambda light chains    Lab Results  Component Value Date   WBC 4.4 06/22/2013   NEUTROABS 3.4 06/22/2013   HGB 12.4* 06/22/2013   HCT 37.8* 06/22/2013   MCV 94.9 06/22/2013   PLT 236 06/22/2013      Chemistry      Component Value Date/Time   NA 137 06/22/2013 1343   NA 133* 05/19/2013 0542   K 4.6 06/22/2013 1343   K 4.3 05/19/2013 0542   CL 100 05/19/2013 0542  CL 106 06/06/2012 1041   CO2 24 06/22/2013 1343   CO2 22 05/19/2013 0542   BUN 21.1 06/22/2013 1343   BUN 23 05/19/2013 0542   CREATININE 1.1 06/22/2013 1343   CREATININE 1.01 05/19/2013 0542      Component Value Date/Time   CALCIUM 8.8 06/22/2013 1343   CALCIUM 8.6 05/19/2013 0542   ALKPHOS 95 06/22/2013 1343   ALKPHOS 212* 05/17/2013 2036   AST 18 06/22/2013 1343   AST 70* 05/17/2013 2036   ALT 17 06/22/2013 1343   ALT 79* 05/17/2013 2036   BILITOT 0.34 06/22/2013 1343   BILITOT 0.6 05/17/2013 2036       RADIOGRAPHIC STUDIES: His most recent CT scan of the chest and neck show no evidence of recurrence of cancer in I have personally reviewed the radiological images as listed and agreed with the findings in the report.  ASSESSMENT & PLAN:  #1 tongue cancer He has no evidence of recurrence of disease. He just saw the ear, nose and throat specialist with assessment with laryngoscopy which was negative. We will continue history, physical examination, every 6 months for now. #2 malnutrition and dysphagia The patient stated he cannot swallow fluid. He is not doing the exercises and  refused to see speech and language therapist. The cause of his swallowing difficulties was due to esophageal stricture.  The patient have malfunctioning feeding tube. He refuses to go back to the surgeon and requested interventional radiologist to fix his feeding tube. I will send in the referral. The patient was told to never swallow again and to use a feeding tube for the rest of his life. #3 history of breast cancer There is no evidence of disease recurrence. I will focus only on the chest wall examination in the future #4 anemia This is likely anemia of chronic disease. The patient denies recent history of bleeding such as epistaxis, hematuria or hematochezia. He is asymptomatic from the anemia. We will observe for now.  He does not require transfusion now.   Orders Placed This Encounter  Procedures  . IR Replc Gastro/Colonic Tube Percut W/Fluoro    Standing Status: Future     Number of Occurrences:      Standing Expiration Date: 08/29/2014    Order Specific Question:  Reason for exam:    Answer:  need revision of gastrostomy tube    Order Specific Question:  Preferred Imaging Location?    Answer:  [facility]  . CBC with Differential    Standing Status: Future     Number of Occurrences:      Standing Expiration Date: 06/28/2014  . Comprehensive metabolic panel    Standing Status: Future     Number of Occurrences:      Standing Expiration Date: 06/28/2014  . TSH    Standing Status: Future     Number of Occurrences:      Standing Expiration Date: 06/28/2014   All questions were answered. The patient knows to call the clinic with any problems, questions or concerns. No barriers to learning was detected. I spent 40 minutes counseling the patient face to face. The total time spent in the appointment was 55 minutes and more than 50% was on counseling and review of test results     Heath Lark, MD 06/28/2013 4:40 PM

## 2013-06-28 NOTE — Telephone Encounter (Signed)
[facility] PT WIFE APPT SCHEDULE FOR OCT. S/W TIFFANY IN IR RE PEG TUBE REVISION. PER TIFFANY SHE WILL HAVE TO HAVE RADIOLOGIST CHECK ORDER AND SHE WILL CALL PT - PT/WIFE AWARE.

## 2013-07-02 ENCOUNTER — Telehealth: Payer: Self-pay | Admitting: Internal Medicine

## 2013-07-02 ENCOUNTER — Ambulatory Visit (HOSPITAL_COMMUNITY)
Admission: RE | Admit: 2013-07-02 | Discharge: 2013-07-02 | Disposition: A | Discharge status: Home / Self Care | Payer: Medicare Other | Source: Ambulatory Visit | Attending: Hematology and Oncology | Admitting: Hematology and Oncology

## 2013-07-02 DIAGNOSIS — J189 Pneumonia, unspecified organism: Secondary | ICD-10-CM

## 2013-07-02 DIAGNOSIS — Z431 Encounter for attention to gastrostomy: Secondary | ICD-10-CM

## 2013-07-02 DIAGNOSIS — M6281 Muscle weakness (generalized): Secondary | ICD-10-CM

## 2013-07-02 DIAGNOSIS — I10 Essential (primary) hypertension: Secondary | ICD-10-CM

## 2013-07-02 DIAGNOSIS — Z85810 Personal history of malignant neoplasm of tongue: Secondary | ICD-10-CM | POA: Insufficient documentation

## 2013-07-02 DIAGNOSIS — R627 Adult failure to thrive: Secondary | ICD-10-CM

## 2013-07-02 DIAGNOSIS — C01 Malignant neoplasm of base of tongue: Secondary | ICD-10-CM

## 2013-07-02 DIAGNOSIS — R1310 Dysphagia, unspecified: Secondary | ICD-10-CM

## 2013-07-02 DIAGNOSIS — R262 Difficulty in walking, not elsewhere classified: Secondary | ICD-10-CM

## 2013-07-02 NOTE — Telephone Encounter (Signed)
Dr. Raliegh Ip, okay to order Occupational Therapy as requested?

## 2013-07-02 NOTE — Telephone Encounter (Signed)
Jose [name] would like a verbal order to do Occupatioanl therapy 2x wk for 3 wks.pls call. Fax will follow

## 2013-07-02 NOTE — Telephone Encounter (Signed)
ok 

## 2013-07-03 NOTE — Telephone Encounter (Signed)
Called Marlowe Kays w/ [facility] back gave her verbal order to do Occupatioanl therapy 2x wk for 3 wks for pt. Marlowe Kays verbalized understanding.

## 2013-08-03 ENCOUNTER — Telehealth: Payer: Self-pay | Admitting: Internal Medicine

## 2013-08-03 NOTE — Telephone Encounter (Signed)
Need verbal orders to extend and re certify pt  Physical Therapy  2 x wk for 5 wks ; re certification is next week pls call wes w/ [facility]

## 2013-08-06 NOTE — Telephone Encounter (Signed)
Called Wes and left verbal order for Physical Therapy 2 x week for 5 weeks for pt.

## 2013-08-06 NOTE — Telephone Encounter (Signed)
Okay for orders? 

## 2013-08-06 NOTE — Telephone Encounter (Signed)
ok 

## 2013-08-23 ENCOUNTER — Ambulatory Visit: Admission: RE | Admit: 2013-08-23 | Payer: Medicare Other | Source: Ambulatory Visit | Admitting: Radiation Oncology

## 2013-09-12 ENCOUNTER — Telehealth: Payer: Self-pay | Admitting: *Deleted

## 2013-09-12 ENCOUNTER — Ambulatory Visit: Payer: Medicare Other | Admitting: Nutrition

## 2013-09-12 DIAGNOSIS — R1319 Other dysphagia: Secondary | ICD-10-CM

## 2013-09-12 DIAGNOSIS — C01 Malignant neoplasm of base of tongue: Secondary | ICD-10-CM

## 2013-09-12 MED ORDER — JEVITY 1.5 CAL/FIBER PO LIQD: ORAL | Status: DC

## 2013-09-12 NOTE — Progress Notes (Signed)
I received a call from nursing that patient is requesting Jevity 1.5 - 6 cans daily instead of 3 cans Jevity 1.5 and 3 cans of Osmolite 1.5 daily.  RN reports M.D. is in agreement.  Was asked to write orders for [facility].  New orders written as follows: 1-1/2 cans Jevity 1.5, 4 times a day with 130 mL free water before and after each bolus feeding via feeding tube as tolerated.  [facility] notified.

## 2013-09-12 NOTE — Telephone Encounter (Signed)
Wife called to request tube feeing be changed to be more jevity than osmolite. States he is having diarrhea due to osmolite. Would like 6 cartons of Jevity.

## 2013-09-17 ENCOUNTER — Ambulatory Visit (HOSPITAL_COMMUNITY)
Admission: RE | Admit: 2013-09-17 | Discharge: 2013-09-17 | Disposition: A | Discharge status: Home / Self Care | Payer: Medicare Other | Source: Ambulatory Visit | Attending: Diagnostic Radiology | Admitting: Diagnostic Radiology

## 2013-09-17 ENCOUNTER — Other Ambulatory Visit (HOSPITAL_COMMUNITY): Payer: Self-pay | Admitting: Diagnostic Radiology

## 2013-09-17 DIAGNOSIS — R633 Feeding difficulties, unspecified: Secondary | ICD-10-CM

## 2013-09-17 MED ORDER — SILVER NITRATE-POT NITRATE 75-25 % EX MISC
1.0000 "application " | Freq: Once | CUTANEOUS | Status: DC
  Filled 2013-09-17: qty 1

## 2013-09-17 NOTE — Procedures (Signed)
Pt came in with complaint of pain and redness at g tube insertion site.  It was noted the patient had significant granuloma tissue forming that was irritated and broken.  Silver Nitrate was applied to the site.  Patient was advised that the tissue will trurn black and fall off.He was also advised to call if there are any problems.  Per Dr Anselm Pancoast if patients still has granuloma tissue with pain and redness after 2 weeks he could return to have the silver nitrate reapplied.

## 2013-09-25 ENCOUNTER — Other Ambulatory Visit (HOSPITAL_COMMUNITY): Payer: Self-pay | Admitting: Interventional Radiology

## 2013-09-25 DIAGNOSIS — R633 Feeding difficulties, unspecified: Secondary | ICD-10-CM

## 2013-09-26 ENCOUNTER — Ambulatory Visit (HOSPITAL_COMMUNITY)
Admission: RE | Admit: 2013-09-26 | Discharge: 2013-09-26 | Disposition: A | Discharge status: Home / Self Care | Payer: Medicare Other | Source: Ambulatory Visit | Attending: Interventional Radiology | Admitting: Interventional Radiology

## 2013-09-26 ENCOUNTER — Encounter: Payer: Self-pay | Admitting: Radiation Oncology

## 2013-09-26 ENCOUNTER — Ambulatory Visit
Admission: RE | Admit: 2013-09-26 | Discharge: 2013-09-26 | Disposition: A | Discharge status: Home / Self Care | Payer: Medicare Other | Source: Ambulatory Visit | Attending: Radiation Oncology | Admitting: Radiation Oncology

## 2013-09-26 VITALS — BP 137/65 | HR 61 | Temp 98.1°F | Resp 16 | Ht 70.0 in | Wt 159.5 lb

## 2013-09-26 DIAGNOSIS — Z431 Encounter for attention to gastrostomy: Secondary | ICD-10-CM | POA: Insufficient documentation

## 2013-09-26 DIAGNOSIS — Z923 Personal history of irradiation: Secondary | ICD-10-CM | POA: Insufficient documentation

## 2013-09-26 DIAGNOSIS — Z79899 Other long term (current) drug therapy: Secondary | ICD-10-CM | POA: Insufficient documentation

## 2013-09-26 DIAGNOSIS — R633 Feeding difficulties, unspecified: Secondary | ICD-10-CM

## 2013-09-26 DIAGNOSIS — R1310 Dysphagia, unspecified: Secondary | ICD-10-CM | POA: Insufficient documentation

## 2013-09-26 DIAGNOSIS — C01 Malignant neoplasm of base of tongue: Secondary | ICD-10-CM | POA: Insufficient documentation

## 2013-09-26 MED ORDER — LARYNGOSCOPY SOLUTION RAD-ONC
15.0000 mL | Freq: Once | TOPICAL | Status: AC
Start: 2013-09-26 — End: 2013-09-26
  Administered 2013-09-26: 15 mL via TOPICAL
  Filled 2013-09-26: qty 15

## 2013-09-26 MED ORDER — SILVER NITRATE-POT NITRATE 75-25 % EX MISC
2.0000 | Freq: Once | CUTANEOUS | Status: AC
  Administered 2013-09-26: 2 via TOPICAL
  Filled 2013-09-26: qty 2

## 2013-09-26 NOTE — Progress Notes (Signed)
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  The patient continues to use his feeding tube for all nutritional intake. He is scheduled to be seen by interventional radiology regarding this later today. The patient is complaining of some irritation around the insertion site. The patient continues to have difficulty swallowing. He underwent dilation at [facility] but he states that he suffered from a perforation and was hospitalized for this. The patient swallowing did not improve after this procedure. He has discussed with them additional surgery although they indicate that they have been told that this would involve a laryngectomy. The patient states today that he does not wish to proceed with this procedure therefore. The patient complains of some mucus and pooling of saliva.                              ALLERGIES:  is allergic to hydromorphone; morphine and related; and codeine.  Meds: Current Outpatient Prescriptions  Medication Sig Dispense Refill  . amLODipine (NORVASC) 5 MG tablet Take 0.5 tablets (2.5 mg total) by mouth daily.  30 tablet  2  . carbamazepine (TEGRETOL) 200 MG tablet 200 mg by PEG Tube route 3 (three) times daily.       . Nutritional Supplements (FEEDING SUPPLEMENT, JEVITY 1.5 CAL/FIBER,) LIQD 1.5 cans Jevity 1.5 QID with 130 ml free water before and after each bolus feeding as tolerated via feeding tube.  1422 mL    . omeprazole (PRILOSEC) 40 MG capsule 40 mg by Feeding Tube route daily.      . traMADol (ULTRAM) 50 MG tablet Take 1 tablet (50 mg total) by mouth every 8 (eight) hours as needed.  30  tablet  0  . Travoprost, BAK Free, (TRAVATAMN) 0.004 % SOLN ophthalmic solution Place 1 drop into both eyes at bedtime.      . Doxylamine Succinate, Sleep, (SLEEP AID PO) 2 capsules by PEG Tube route at bedtime as needed (sleep).      . LORazepam (ATIVAN) 0.5 MG tablet 0.5 mg by PEG Tube route 3 (three) times daily as needed for anxiety. ANXIETY       . Melatonin 1 MG TABS Take 1 mg by mouth at bedtime as needed.      . tamsulosin (FLOMAX) 0.4 MG CAPS capsule Take 0.4 mg by mouth daily.       No current facility-administered medications for this encounter.   Facility-Administered Medications Ordered in Other Encounters  Medication Dose Route Frequency Provider Last Rate Last Dose  . silver nitrate applicators applicator 2 Stick  2 Stick Topical Once Hedy Jacob, PA-C      . topical emolient (BIAFINE) emulsion   Topical PRN Marye Round, MD        Physical Findings: The patient is in no acute distress. Patient is alert and oriented.  height is 5\' 10"  (1.778 m) and weight is 159 lb 8 oz (72.349 kg). His oral temperature is 98.1 F (36.7 C). His blood pressure is 137/65 and his pulse is 61. His respiration is 16 and oxygen saturation  is 100%. .   General: Well-developed, in no acute distress; the patient has lost some weight since he originally was seen although this has trended up in the last several months. HEENT: Normocephalic, atraumatic Cardiovascular: Regular rate and rhythm Respiratory: Clear to auscultation bilaterally GI: Soft, nontender, normal bowel sounds Extremities: No edema present   Fiberoptic exam: After the use of topical anesthetic, the flexible laryngoscope was passed through the right near. Good visualization was obtained. No lesions or suspicious findings within the larynx, hypopharynx, oropharynx or nasopharynx.  Some pooling of saliva was present in the oropharynx.    Lab Findings: Lab Results  Component Value Date   WBC 4.4 06/22/2013   HGB 12.4*  06/22/2013   HCT 37.8* 06/22/2013   MCV 94.9 06/22/2013   PLT 236 06/22/2013     Radiographic Findings: Ir Patient Eval Tech 0-60 Mins  09/18/2013   Author: [place] Service: (none) Author Type: Technologist    Filed: 09/17/2013  2:03 PM Note Time: 09/17/2013  1:59 PM Status: Signed    Editor: Garvin Fila Mazepa (Technologist)        Pt came in with complaint of pain and redness at g tube insertion site.  It was noted the patient had significant granuloma tissue forming that was irritated and broken.  Silver Nitrate was applied to the site.  Patient was advised that the tissue will trurn black and fall off.He was also advised to call if there are any problems.  Per Dr Anselm Pancoast if patients still has granuloma tissue with pain and redness after 2 weeks he could return to have the silver nitrate reapplied.        Impression:    The patient clinically remains NED. No suspicious findings on exam and his last CT scan of the neck did not demonstrate any concerns for recurrence/progressive disease. He is complaining of some irritation of the feeding tube and this is going to be evaluated by interventional radiology later today. He has seen them recently regarding this issue. No plans currently for any further dilatation or procedure to address difficulty swallowing with this having been tried at [facility] with complication since his last visit.  Plan:  The patient will followup in our clinic in 6 months.   Jodelle Gross, M.D., Ph.D.

## 2013-09-26 NOTE — Progress Notes (Addendum)
Jose [name] here for follow up after treatment to the base of his tongue.  He denies pain. He reports feeling dizzy and says this has been going on for 2 years.  He is using a walker today.  He reports a dry mouth and "mucus" that gets stuck in the back of his throat that he has to cough up.  His oral mucosa is intact.  He is taking all his nutrition through his peg tube.  He is taking in Jevity 4-5 cans per day and osmolite as needed.  He has an appointment with Interventional Radiology to have his peg tube checked this afternoon.  He reports the tube is flushing well. He reports that it has been bleeding.  The site is red around the insertion site.  He reports fatigue.  He has gained 8 lbs from 06/28/13.   He also said he had his esophagus dilated at [facility] on 05/01/13.  He said he had two procedures and was put to sleep twice.  He does not remember the time he was in the hospital.  He went to rehab after for 21 days.

## 2013-10-23 ENCOUNTER — Encounter: Payer: Self-pay | Admitting: Internal Medicine

## 2013-10-23 ENCOUNTER — Ambulatory Visit (INDEPENDENT_AMBULATORY_CARE_PROVIDER_SITE_OTHER): Payer: Medicare Other | Admitting: Internal Medicine

## 2013-10-23 VITALS — BP 120/60 | HR 61 | Temp 98.3°F | Resp 18 | Ht 70.0 in | Wt 163.0 lb

## 2013-10-23 DIAGNOSIS — Z85820 Personal history of malignant melanoma of skin: Secondary | ICD-10-CM

## 2013-10-23 DIAGNOSIS — I10 Essential (primary) hypertension: Secondary | ICD-10-CM

## 2013-10-23 DIAGNOSIS — G500 Trigeminal neuralgia: Secondary | ICD-10-CM

## 2013-10-23 DIAGNOSIS — E785 Hyperlipidemia, unspecified: Secondary | ICD-10-CM

## 2013-10-23 DIAGNOSIS — D2330 Other benign neoplasm of skin of unspecified part of face: Secondary | ICD-10-CM

## 2013-10-23 DIAGNOSIS — C01 Malignant neoplasm of base of tongue: Secondary | ICD-10-CM

## 2013-10-23 MED ORDER — TRAMADOL HCL 50 MG PO TABS: 50.0000 mg | ORAL_TABLET | Freq: Three times a day (TID) | ORAL | Status: DC | PRN

## 2013-10-23 NOTE — Patient Instructions (Signed)
Discontinue amlodipine  Dermatology referral as discussed  Please check your blood pressure on a regular basis.  If it is consistently greater than 150/90, please make an office appointment.  Return in 3 months for follow-up

## 2013-10-23 NOTE — Progress Notes (Signed)
Subjective:    Patient ID: Jose [name], male    DOB: 1927-10-01, 80 y.o.   MRN: [id number]  HPI  80 year old patient who is followed closely oncology due to locally invasive cancer involving the base of the tongue.  Since his last visit here, he was evaluated at [facility] for dilatation of an esophageal stricture related to prior RT.  This was complicated by a perforation with pneumothorax requiring chest tube placement and pneumoperitoneum.  He's also been hospitalized locally for a community-acquired pneumonia. Today he is doing fairly well, but has some orthostatic dizziness, and chronic fatigue and weakness.  He has a history of hypertension and has been on amlodipine.  He has lost probably in excess of 30 pounds over the past couple of years  Past Medical History  Diagnosis Date  . Blood transfusion     2006 had 4 units  . Arthritis     knees,back,shoulders  . Anxiety   . Right ureteral stone S/P URETERAL STENT 01-24-11  AND ESWL  03-01-11  . History of acute renal failure 01-23-11    DUE TO HYDRONEPHROSIS AND RIGHT STONE  . Normal cardiac stress test 09-25-2007  . Glaucoma   . Cataract immature   . Diverticulosis of colon   . History of GI diverticular bleed   . Hyperlipemia   . Impaired hearing NOT WEARING HIS BILATERL AIDS  . Urgency of urination     DUE TO URETERAL STONE AND STENT  . Frequency of urination   . Insomnia   . Anemia   . Chronic kidney disease     kidney stones  . GERD (gastroesophageal reflux disease)   . Hernia     inguinal  . Headache(784.0)     migraines  . Trigeminal neuralgia   . Hypertension   . History of radiation therapy 06/21/11-08/04/11    tongue/h/n 69.96 gy in 33 fxs  . History of chemotherapy 07/26/11    taxol/carbo completed/stopped 07/26/11  . 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 G tube feedings   . History of radiation therapy 06/21/11-08/04/11    base of tongue  . Colon polyps   . Diverticulosis   . Male breast cancer 1988   S/P RIGHT MASTECTOMY W/ NODE DISSECTION AND CHEMO  . 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 Skin cancer 2002    melanoma- back    History   Social History  . Marital Status: Married    Spouse Name: N/A    Number of Children: 2  . Years of Education: N/A   Occupational History  . retired    Social History Main Topics  . Smoking status: Former Smoker -- 0.25 packs/day for 30 years    Types: Cigarettes, Cigars    Quit date: 12/20/1980  . Smokeless tobacco: Never Used  . Alcohol Use: No  . Drug Use: No  . Sexual Activity: Yes   Other Topics Concern  . Not on file   Social History Narrative  . No narrative on file    Past Surgical History  Procedure Laterality Date  . Cystoscopy w/ ureteral stent placement  01/24/2011    Procedure: CYSTOSCOPY WITH RETROGRADE PYELOGRAM/URETERAL STENT PLACEMENT;  Surgeon: Fredricka Bonine, MD;  Location: [facility];  Service: Urology;  Laterality: Right;  . Left leg surg for fx  AGE 24    patient states left thigh  . Craniectomy suboccipital for exploration / decompression cranial nerves  2003  5TH CRANIAL NERVE DECOMPRESSION  . Simple mastectomy  1988    MALE--- RIGHT BREAST W/ NODE DISSECTION  . Melanoma excision  10 YRS AGO    BACK AREA  . Extracorporeal shock wave lithotripsy  03-01-11    RIGHT  . Ureteroscopy  03/26/2011    Procedure: URETEROSCOPY;  Surgeon: Fredricka Bonine, MD;  Location: [facility];  Service: Urology;  Laterality: Right;  RIGHT URETEROSCOPY, HOLMIUM LASER AND STENT   . Examination under anesthesia  03/26/2011    Procedure: EXAM UNDER ANESTHESIA;  Surgeon: Fredricka Bonine, MD;  Location: [facility];  Service: Urology;  Laterality: N/A;  . Lymph node biopsy  04/21/2011    Procedure: LYMPH NODE BIOPSY;  Surgeon: Beckie Salts, MD;  Location: [place];  Service: ENT;  Laterality: Right;  . Gastrostomy tube placement    . Esophagoscopy with dilitation N/A  02/14/2013    Procedure: ESOPHAGOSCOPY WITH DILITATION;  Surgeon: Izora Gala, MD;  Location: [place];  Service: ENT;  Laterality: N/A;  Attempted  . Direct laryngoscopy  02/14/2013    Procedure: DIRECT LARYNGOSCOPY;  Surgeon: Izora Gala, MD;  Location: [facility] OR;  Service: ENT;;  . Esophageal dilation  05/01/13    tread method esophageal dilation - Dr. Fredricka Bonine  . Diagnostic laproscopy  05/03/13    Dr. Margarette Canada at [facility]    Family History  Problem Relation Age of Onset  . Heart disease Mother   . Heart disease Father   . Cancer Sister     "perineum"  . Stomach cancer Paternal Grandmother     Allergies  Allergen Reactions  . Hydromorphone Swelling and Other (See Comments)    phlebitis  . Morphine And Related Other (See Comments)    Phlebitis, makes pt out of it   . Codeine Other (See Comments)    Dizzy, mental status changes    Current Outpatient Prescriptions on File Prior to Visit  Medication Sig Dispense Refill  . amLODipine (NORVASC) 5 MG tablet Take 0.5 tablets (2.5 mg total) by mouth daily.  30 tablet  2  . carbamazepine (TEGRETOL) 200 MG tablet 200 mg by PEG Tube route 3 (three) times daily.       . Doxylamine Succinate, Sleep, (SLEEP AID PO) 2 capsules by PEG Tube route at bedtime as needed (sleep).      . Melatonin 1 MG TABS Take 1 mg by mouth at bedtime as needed.      . Nutritional Supplements (FEEDING SUPPLEMENT, JEVITY 1.5 CAL/FIBER,) LIQD 1.5 cans Jevity 1.5 QID with 130 ml free water before and after each bolus feeding as tolerated via feeding tube.  1422 mL    . omeprazole (PRILOSEC) 40 MG capsule 40 mg by Feeding Tube route daily as needed.       . Travoprost, BAK Free, (TRAVATAMN) 0.004 % SOLN ophthalmic solution Place 1 drop into both eyes at bedtime.      Marland Kitchen LORazepam (ATIVAN) 0.5 MG tablet 0.5 mg by PEG Tube route 3 (three) times daily as needed for anxiety. ANXIETY       . tamsulosin (FLOMAX) 0.4 MG CAPS capsule Take 0.4 mg  by mouth daily.       Current Facility-Administered Medications on File Prior to Visit  Medication Dose Route Frequency Provider Last Rate Last Dose  . topical emolient (BIAFINE) emulsion   Topical PRN Marye Round, MD        BP 120/60  Pulse 61  Temp(Src) 98.3 F (36.8 C) (Oral)  Resp 18  Ht 5\' 10"  (1.778 m)  Wt 163 lb (73.936 kg)  BMI 23.39 kg/m2  SpO2 97%       Review of Systems  Constitutional: Positive for activity change and fatigue. Negative for fever, chills and appetite change.  HENT: Negative for congestion, dental problem, ear pain, hearing loss, sore throat, tinnitus, trouble swallowing and voice change.   Eyes: Negative for pain, discharge and visual disturbance.  Respiratory: Negative for cough, chest tightness, wheezing and stridor.   Cardiovascular: Negative for chest pain, palpitations and leg swelling.  Gastrointestinal: Negative for nausea, vomiting, abdominal pain, diarrhea, constipation, blood in stool and abdominal distention.  Genitourinary: Negative for urgency, hematuria, flank pain, discharge, difficulty urinating and genital sores.  Musculoskeletal: Negative for arthralgias, back pain, gait problem, joint swelling, myalgias and neck stiffness.  Skin: Negative for rash.  Neurological: Positive for dizziness, weakness and light-headedness. Negative for syncope, speech difficulty, numbness and headaches.  Hematological: Negative for adenopathy. Does not bruise/bleed easily.  Psychiatric/Behavioral: Negative for behavioral problems and dysphoric mood. The patient is not nervous/anxious.        Objective:   Physical Exam  Constitutional: He is oriented to person, place, and time. He appears well-developed.  Blood pressure 120/60 sitting Blood pressure 90/60 standing  HENT:  Head: Normocephalic.  Right Ear: External ear normal.  Left Ear: External ear normal.  Eyes: Conjunctivae and EOM are normal.  Neck: Normal range of motion.    Cardiovascular: Normal rate, regular rhythm and normal heart sounds.   Pulmonary/Chest: Breath sounds normal. No respiratory distress. He has no wheezes. He has no rales.  Abdominal: Bowel sounds are normal.  Musculoskeletal: Normal range of motion. He exhibits no edema and no tenderness.  Lymphadenopathy:    He has no cervical adenopathy.  Neurological: He is alert and oriented to person, place, and time.  Skin:  Irregular pigmented lesion, right malar facial area  Psychiatric: He has a normal mood and affect. His behavior is normal.          Assessment & Plan:  Orthostatic hypotension.  Amlodipine will be discontinued Pigmented lesion, right facial area.  Rule out melanoma Esophageal stricture/malnutrition Cancer base of the tongue.  Followup oncology Dyslipidemia  Recheck 3 months Dermatology referral

## 2013-10-23 NOTE — Progress Notes (Signed)
Pre visit review using our clinic review tool, if applicable. No additional management support is needed unless otherwise documented below in the visit note. 

## 2013-11-23 ENCOUNTER — Telehealth: Payer: Self-pay | Admitting: Internal Medicine

## 2013-11-23 MED ORDER — CARBAMAZEPINE 200 MG PO TABS: 200.0000 mg | ORAL_TABLET | Freq: Three times a day (TID) | ORAL | Status: DC

## 2013-11-23 NOTE — Telephone Encounter (Signed)
Wife states pt's med carbamazepine (TEGRETOL) 200 MG tablet Has been delayed from the [place] bc pt has been too week to go down for appt. They are going to call his dr and get apptoved, but they have not received yet.  Pt is out and would like to know if Dr Raliegh Ip will call in a 10 day supply (#30) To [facility]

## 2013-11-23 NOTE — Telephone Encounter (Signed)
Spoke to pt's wife Sadie, told her Rx sent to pharmacy as requested. Sadie verbalized understanding.

## 2013-11-23 NOTE — Telephone Encounter (Signed)
Please see message, okay to order Tegretol for pt?

## 2013-11-23 NOTE — Telephone Encounter (Signed)
Ok to RF? 

## 2013-12-03 ENCOUNTER — Telehealth: Payer: Self-pay | Admitting: Internal Medicine

## 2013-12-03 DIAGNOSIS — C01 Malignant neoplasm of base of tongue: Secondary | ICD-10-CM

## 2013-12-03 DIAGNOSIS — R1319 Other dysphagia: Secondary | ICD-10-CM

## 2013-12-03 MED ORDER — CARBAMAZEPINE 200 MG PO TABS: 200.0000 mg | ORAL_TABLET | Freq: Three times a day (TID) | ORAL | Status: DC

## 2013-12-03 MED ORDER — OMEPRAZOLE 40 MG PO CPDR: 40.0000 mg | DELAYED_RELEASE_CAPSULE | Freq: Every day | ORAL | Status: DC | PRN

## 2013-12-03 NOTE — Telephone Encounter (Signed)
Pt's wife calling to request refills of the following, pt out of meds and needs today as he is in pain:  carbamazepine (TEGRETOL) 200 MG tablet omeprazole (PRILOSEC) 40 MG capsule  Pharmacy:  [facility], [place]

## 2013-12-03 NOTE — Telephone Encounter (Signed)
Pt's wife Willaim Rayas, notified Rx's sent to pharmacy.

## 2013-12-19 IMAGING — XA IR PERC PLACEMENT GASTROSTOMY
4 series · 11 of 11 positions shown · non-contrast
Comparison: none

INDICATION: Malignant neoplasm of the base of the tongue

[Series 5: body 4 · 3 of 4 frames shown]
[frame 1/4]
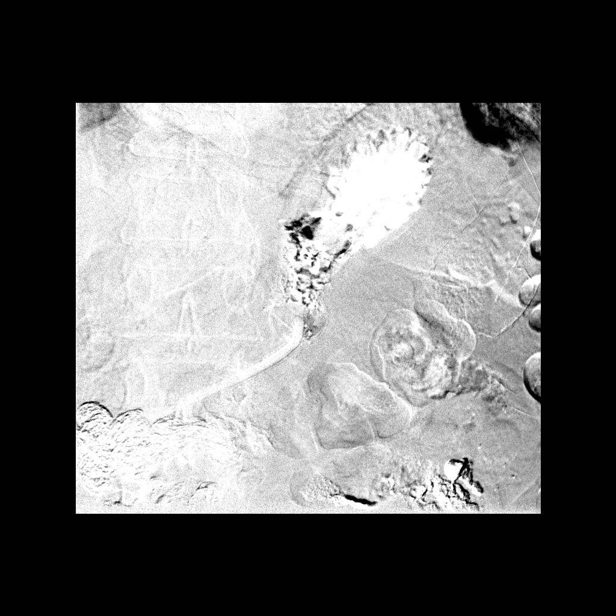
[frame 3/4]
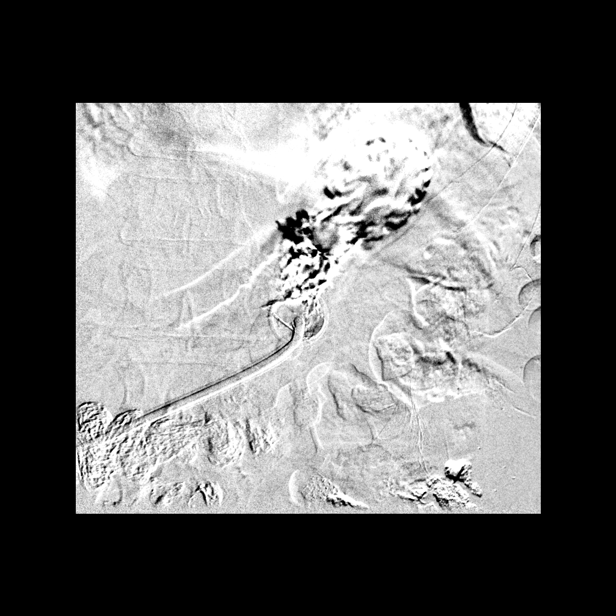
[frame 4/4]
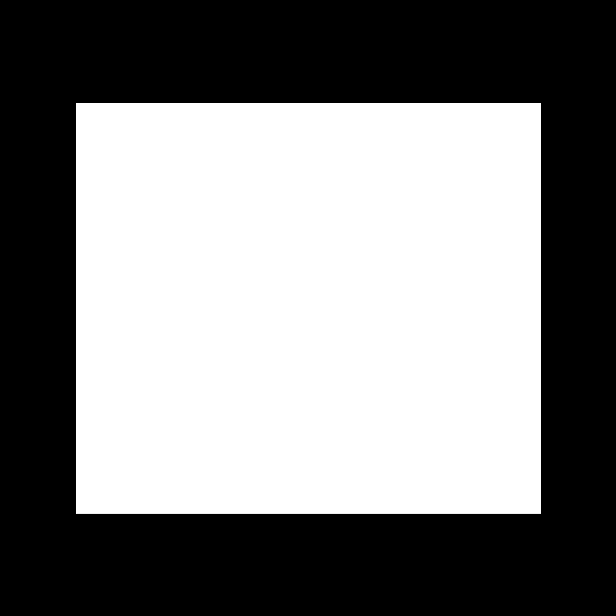

[Series 6: care single · 1 of 1 slices shown (1 of 2)]
[im 1/1]
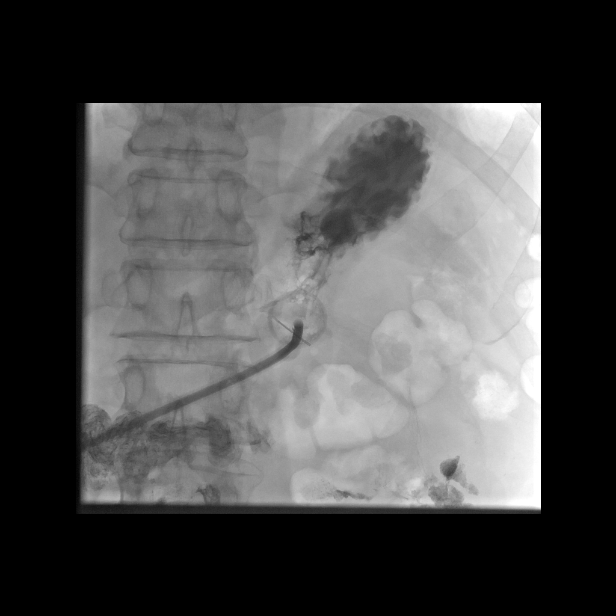

[Series 7: care single · 1 of 1 slices shown (2 of 2)]
[im 1/1]
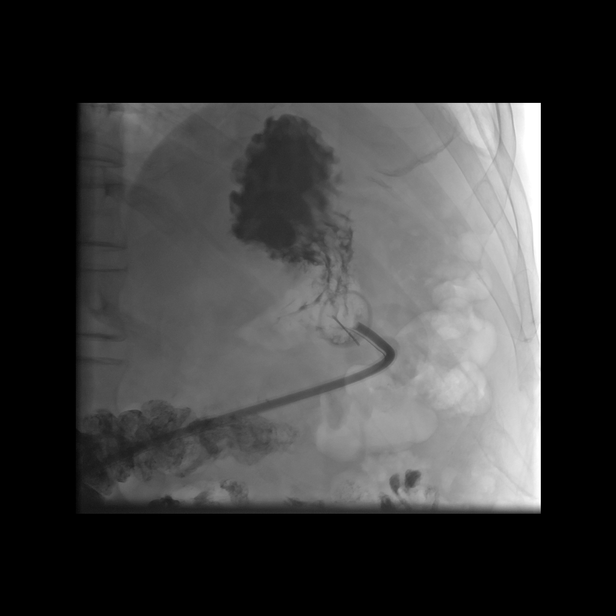

[Series 300: sp percut gastrostomy tube insert  w/flu · 6 of 6 slices shown]
[im 1/6]
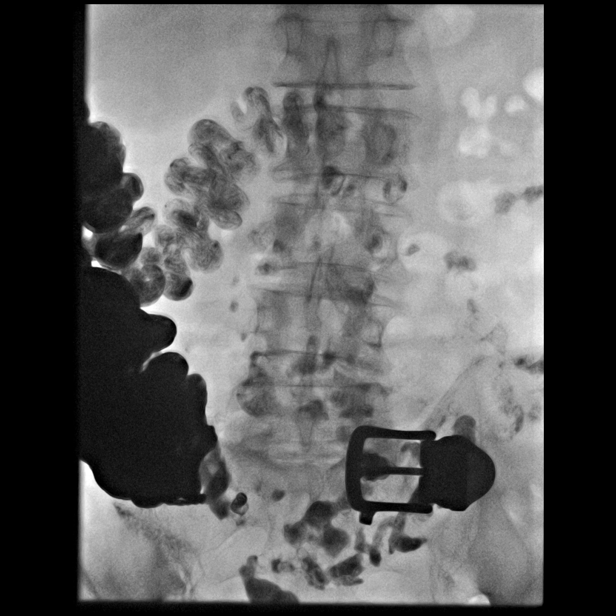
[im 2/6]
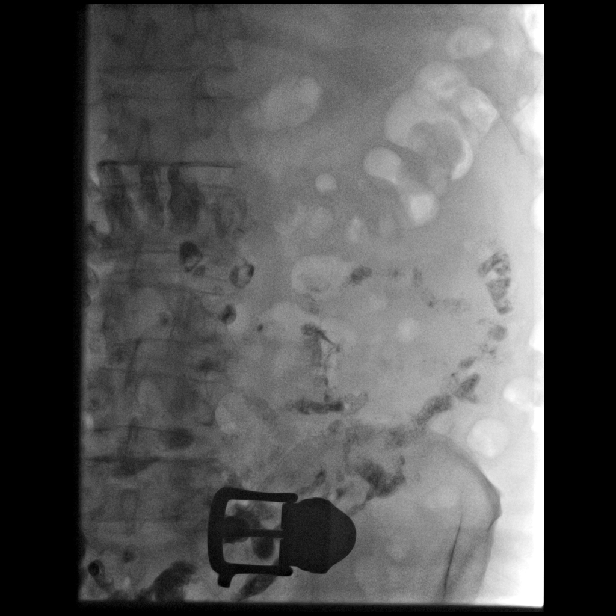
[im 3/6]
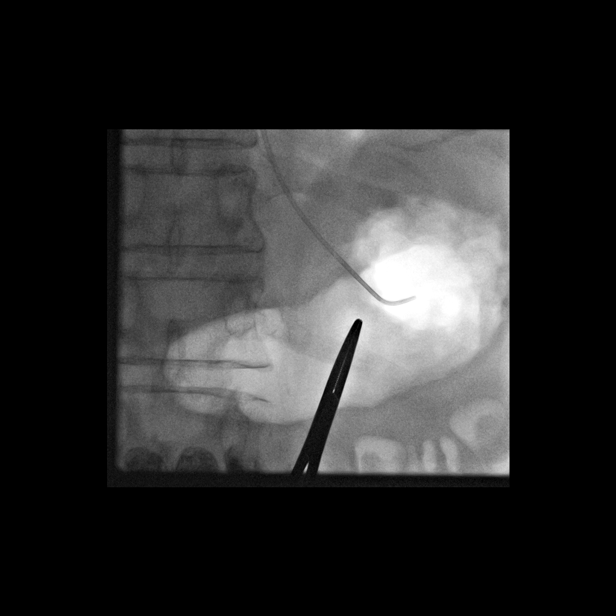
[im 4/6]
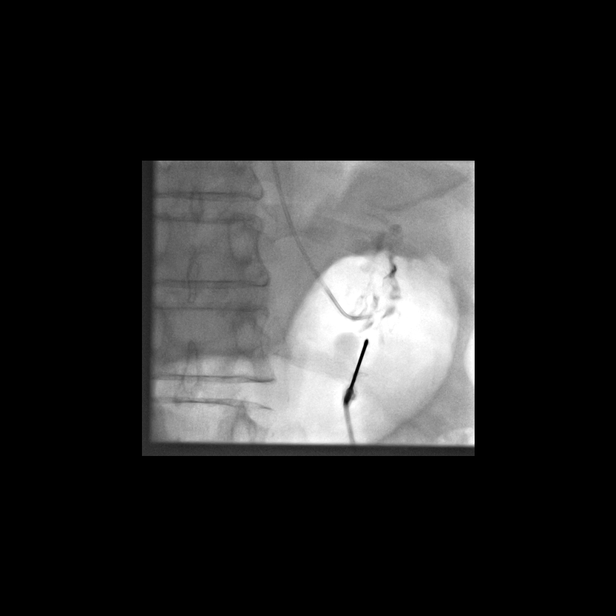
[im 5/6]
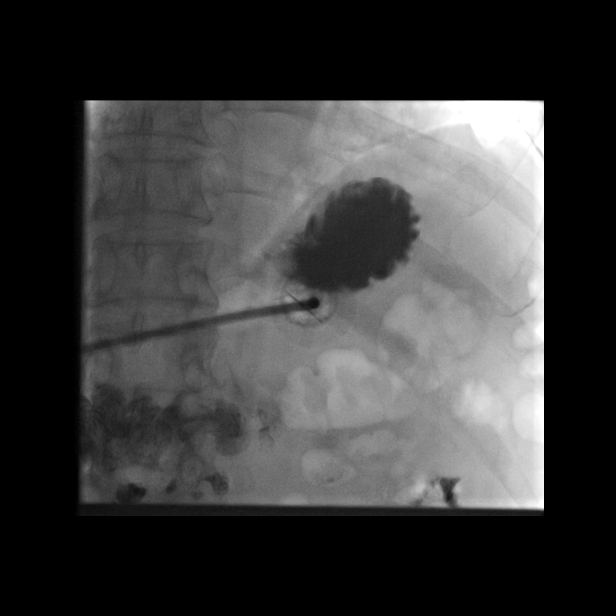
[im 6/6]
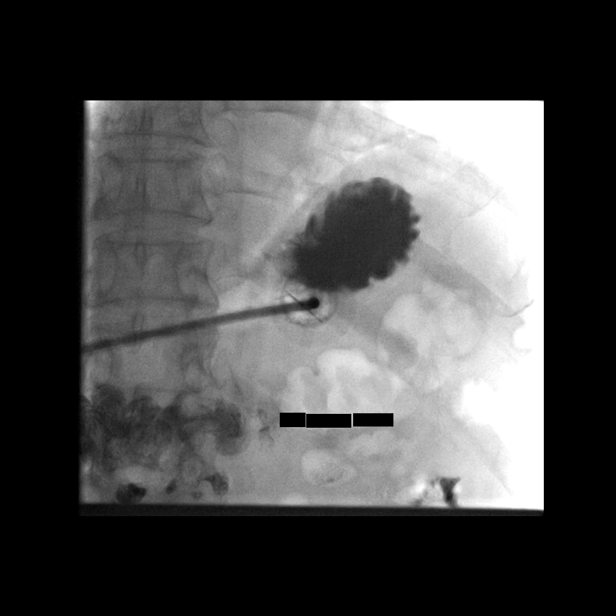

[11 of 11 positions shown; findings below may reference images not displayed]

PULL TROUGH GASTOSTOMY TUBE PLACEMENT

Medications:  Versed 2 mg IV; Fentanyl 100 mcg IV; Ancef 1 gm IV;
Antibiotics were administered within 1 hour of the procedure.

Contrast volume:  20 mL Ymnipaque-CQQ administered enterically

Sedation time: 15 minutes

Fluoroscopy time: 3 minutes

Complications: None immediate

PROCEDURE/FINDINGS:

Informed written consent was obtained from the patient following
explanation of the procedure, risks, benefits and alternatives.  A
time out was performed prior to the initiation of the procedure.
Maximal barrier sterile technique utilized including caps, mask,
sterile gowns, sterile gloves, large sterile drape, hand hygiene
and Betadine prep.

The left upper quadrant was sterilely prepped and draped.  An oral
gastric catheter was inserted into the stomach under fluoroscopy.
The existing nasogastric feeding tube was removed.  The left costal
margin and barium opacified transverse colon were identified and
avoided.  Air was injected into the stomach for insufflation and
visualization under fluoroscopy.  Under sterile conditions a 17
gauge trocar needle was utilized to access the stomach
percutaneously beneath the left subcostal margin after the
overlying soft tissues were anesthetized with 1% Lidocaine with
epinephrine.  Needle position was confirmed within the stomach with
aspiration of air and injection of small amount of contrast.   A
single T tack was deployed for gastropexy.  Over an Amplatz guide
wire, a 9-French sheath was inserted into the stomach.  A snare
device was utilized to capture the oral gastric catheter.  The
snare device was pulled retrograde from the stomach up the
esophagus and out the oropharynx.  The 20-French pull-through
gastrostomy was connected to the snare device and pulled antegrade
through the oropharynx down the esophagus into the stomach and then
through the percutaneous tract external to the patient.  The
gastrostomy was assembled externally.  Contrast injection confirms
position in the stomach.   Several spot radiographic images were
obtained in various obliquities for documentation.  The patient
tolerated procedure well without immediate post procedural
complication.
IMPRESSION: Successful fluoroscopic insertion of a 20-French "pull-through"
gastrostomy.

## 2013-12-27 ENCOUNTER — Telehealth: Payer: Self-pay | Admitting: *Deleted

## 2013-12-27 ENCOUNTER — Telehealth: Payer: Self-pay | Admitting: Hematology and Oncology

## 2013-12-27 ENCOUNTER — Other Ambulatory Visit (HOSPITAL_BASED_OUTPATIENT_CLINIC_OR_DEPARTMENT_OTHER): Payer: Medicare Other

## 2013-12-27 ENCOUNTER — Encounter: Payer: Self-pay | Admitting: Hematology and Oncology

## 2013-12-27 ENCOUNTER — Ambulatory Visit (HOSPITAL_BASED_OUTPATIENT_CLINIC_OR_DEPARTMENT_OTHER): Payer: Medicare Other | Admitting: Hematology and Oncology

## 2013-12-27 VITALS — BP 149/84 | HR 70 | Temp 98.3°F | Resp 17 | Ht 70.0 in | Wt 162.6 lb

## 2013-12-27 DIAGNOSIS — Z931 Gastrostomy status: Secondary | ICD-10-CM

## 2013-12-27 DIAGNOSIS — C01 Malignant neoplasm of base of tongue: Secondary | ICD-10-CM

## 2013-12-27 DIAGNOSIS — Z85820 Personal history of malignant melanoma of skin: Secondary | ICD-10-CM | POA: Insufficient documentation

## 2013-12-27 DIAGNOSIS — Z853 Personal history of malignant neoplasm of breast: Secondary | ICD-10-CM

## 2013-12-27 DIAGNOSIS — R110 Nausea: Secondary | ICD-10-CM | POA: Insufficient documentation

## 2013-12-27 DIAGNOSIS — C439 Malignant melanoma of skin, unspecified: Secondary | ICD-10-CM

## 2013-12-27 DIAGNOSIS — R627 Adult failure to thrive: Secondary | ICD-10-CM

## 2013-12-27 HISTORY — DX: Malignant melanoma of skin, unspecified: C43.9

## 2013-12-27 HISTORY — DX: Nausea: R11.0

## 2013-12-27 LAB — CBC WITH DIFFERENTIAL/PLATELET
BASO%: 0.9 % (ref 0.0–2.0)
BASOS ABS: 0 10*3/uL (ref 0.0–0.1)
EOS%: 0.9 % (ref 0.0–7.0)
Eosinophils Absolute: 0 10*3/uL (ref 0.0–0.5)
HCT: 40.1 % (ref 38.4–49.9)
HEMOGLOBIN: 13.5 g/dL (ref 13.0–17.1)
LYMPH#: 0.8 10*3/uL — AB (ref 0.9–3.3)
LYMPH%: 18 % (ref 14.0–49.0)
MCH: 31.8 pg (ref 27.2–33.4)
MCHC: 33.7 g/dL (ref 32.0–36.0)
MCV: 94.4 fL (ref 79.3–98.0)
MONO#: 0.5 10*3/uL (ref 0.1–0.9)
MONO%: 12 % (ref 0.0–14.0)
NEUT#: 3.1 10*3/uL (ref 1.5–6.5)
NEUT%: 68.2 % (ref 39.0–75.0)
Platelets: 234 10*3/uL (ref 140–400)
RBC: 4.25 10*6/uL (ref 4.20–5.82)
RDW: 12.5 % (ref 11.0–14.6)
WBC: 4.5 10*3/uL (ref 4.0–10.3)

## 2013-12-27 LAB — COMPREHENSIVE METABOLIC PANEL (CC13)
ALBUMIN: 3.7 g/dL (ref 3.5–5.0)
ALT: 16 U/L (ref 0–55)
AST: 17 U/L (ref 5–34)
Alkaline Phosphatase: 98 U/L (ref 40–150)
Anion Gap: 6 mEq/L (ref 3–11)
BUN: 30.7 mg/dL — ABNORMAL HIGH (ref 7.0–26.0)
CALCIUM: 9.6 mg/dL (ref 8.4–10.4)
CHLORIDE: 104 meq/L (ref 98–109)
CO2: 32 mEq/L — ABNORMAL HIGH (ref 22–29)
Creatinine: 1.3 mg/dL (ref 0.7–1.3)
Glucose: 142 mg/dl — ABNORMAL HIGH (ref 70–140)
POTASSIUM: 5.1 meq/L (ref 3.5–5.1)
Sodium: 143 mEq/L (ref 136–145)
Total Bilirubin: 0.26 mg/dL (ref 0.20–1.20)
Total Protein: 7 g/dL (ref 6.4–8.3)

## 2013-12-27 LAB — TSH CHCC: TSH: 2.789 m(IU)/L (ref 0.320–4.118)

## 2013-12-27 MED ORDER — ONDANSETRON HCL 4 MG/5ML PO SOLN: 8.0000 mg | Freq: Three times a day (TID) | ORAL | Status: DC | PRN

## 2013-12-27 NOTE — Telephone Encounter (Signed)
Pharmacy does not have Zofran Solution in stock. They ask if Zofran ODT will work for pt?

## 2013-12-27 NOTE — Assessment & Plan Note (Signed)
Clinically, no evidence of disease

## 2013-12-27 NOTE — Telephone Encounter (Signed)
Informed pharmacy ok w/ Dr. Alvy Bimler for Zofran ODT.

## 2013-12-27 NOTE — Assessment & Plan Note (Signed)
I have requested outside records. The patient has signed consent form for release of information. I recommend PET CT scan for staging and he agreed

## 2013-12-27 NOTE — Telephone Encounter (Signed)
gv pt appt schedule for nov. central will call re pt scan - pt aware.

## 2013-12-27 NOTE — Progress Notes (Signed)
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 He received definitive concurrent chemoradiation with weekly Carboplatin/Taxol on 06/14/2011; and daily XRT 06/21/11. Chemotherapy completed on 07/26/11 (last dose held due to fatigue, anorexia, and mucositis). Radiation completed on 08/04/11. The patient also has background history of breast cancer who was treated with surgery and tamoxifen. He denies any palpable chest wall mass. On 12/20/2013, he underwent resection of the right temporal region for melanoma. INTERVAL HISTORY: Please see below for problem oriented charting. He feels miserable after recent surgery for melanoma. He still had pain and facial swelling. He complained of some nausea. He still has problems swallowing food and is dependent in feeding tube for nutritional intake.  REVIEW OF SYSTEMS:   Constitutional: Denies fevers, chills or abnormal weight loss Eyes: Denies blurriness of vision Ears, nose, mouth, throat, and face: Denies mucositis or sore throat Respiratory: Denies cough, dyspnea or wheezes Cardiovascular: Denies palpitation, chest discomfort or lower extremity swelling Lymphatics: Denies new lymphadenopathy or easy bruising Neurological:Denies numbness, tingling or new weaknesses Behavioral/Psych: Mood is stable, no new changes  All other systems were reviewed with the patient and are negative.  I have reviewed the past medical history, past surgical history, social history and family history with the patient and they are unchanged from previous note.  ALLERGIES:  is allergic to hydromorphone; morphine and  related; and codeine.  MEDICATIONS:  Current Outpatient Prescriptions  Medication Sig Dispense Refill  . carbamazepine (TEGRETOL) 200 MG tablet Take 1 tablet (200 mg total) by mouth 3 (three) times daily.  90 tablet  5  . Doxylamine Succinate, Sleep, (SLEEP AID PO) 2 capsules by PEG Tube route at bedtime as needed (sleep).      . Melatonin 1 MG TABS Take 1 mg by mouth at bedtime as needed.      . Nutritional Supplements (FEEDING SUPPLEMENT, JEVITY 1.5 CAL/FIBER,) LIQD 1.5 cans Jevity 1.5 QID with 130 ml free water before and after each bolus feeding as tolerated via feeding tube.  1422 mL    . omeprazole (PRILOSEC) 40 MG capsule Take 1 capsule (40 mg total) by mouth daily as needed.  90 capsule  3  . traMADol (ULTRAM) 50 MG tablet Take 1 tablet (50 mg total) by mouth every 8 (eight) hours as needed.  90 tablet  2  . Travoprost, BAK Free, (TRAVATAMN) 0.004 % SOLN ophthalmic solution Place 1 drop into both eyes at bedtime.      . ondansetron (ZOFRAN) 4 MG/5ML solution Take 10 mLs (8 mg total) by mouth every 8 (eight) hours as needed for nausea or vomiting.  50 mL  6   No current facility-administered medications for this visit.   Facility-Administered Medications Ordered in Other Visits  Medication Dose Route Frequency Provider Last Rate Last Dose  . topical emolient (BIAFINE) emulsion   Topical PRN Marye Round, MD        PHYSICAL EXAMINATION: ECOG PERFORMANCE STATUS: 1 - Symptomatic but completely ambulatory  Filed Vitals:   12/27/13 1328  BP: 149/84  Pulse: 70  Temp: 98.3 F (36.8 C)  Resp: 17   Filed Weights   12/27/13 1328  Weight: 162 lb 9.6 oz (73.755 kg)  GENERAL:alert, no distress and comfortable SKIN: Significant facial bruising and scar in the right face from recent surgery EYES: normal, Conjunctiva are pink and non-injected, sclera clear OROPHARYNX:no exudate, no erythema and lips, buccal mucosa, and tongue normal  NECK: Thick from prior radiation. LYMPH:  no  palpable lymphadenopathy in the cervical, axillary or inguinal LUNGS: clear to auscultation and percussion with normal breathing effort HEART: regular rate & rhythm and no murmurs and no lower extremity edema ABDOMEN:abdomen soft, non-tender and normal bowel sounds. Feeding tube site looks okay Musculoskeletal:no cyanosis of digits and no clubbing  NEURO: alert & oriented x 3 with fluent speech, no focal motor/sensory deficits  LABORATORY DATA:  I have reviewed the data as listed    Component Value Date/Time   NA 143 12/27/2013 1300   NA 133* 05/19/2013 0542   K 5.1 12/27/2013 1300   K 4.3 05/19/2013 0542   CL 100 05/19/2013 0542   CL 106 06/06/2012 1041   CO2 32* 12/27/2013 1300   CO2 22 05/19/2013 0542   GLUCOSE 142* 12/27/2013 1300   GLUCOSE 127* 05/19/2013 0542   GLUCOSE 148* 06/06/2012 1041   BUN 30.7* 12/27/2013 1300   BUN 23 05/19/2013 0542   CREATININE 1.3 12/27/2013 1300   CREATININE 1.01 05/19/2013 0542   CALCIUM 9.6 12/27/2013 1300   CALCIUM 8.6 05/19/2013 0542   PROT 7.0 12/27/2013 1300   PROT 6.5 05/17/2013 2036   ALBUMIN 3.7 12/27/2013 1300   ALBUMIN 2.4* 05/17/2013 2036   AST 17 12/27/2013 1300   AST 70* 05/17/2013 2036   ALT 16 12/27/2013 1300   ALT 79* 05/17/2013 2036   ALKPHOS 98 12/27/2013 1300   ALKPHOS 212* 05/17/2013 2036   BILITOT 0.26 12/27/2013 1300   BILITOT 0.6 05/17/2013 2036   GFRNONAA 67* 05/19/2013 0542   GFRAA 77* 05/19/2013 0542    No results found for this basename: SPEP,  UPEP,   kappa and lambda light chains    Lab Results  Component Value Date   WBC 4.5 12/27/2013   NEUTROABS 3.1 12/27/2013   HGB 13.5 12/27/2013   HCT 40.1 12/27/2013   MCV 94.4 12/27/2013   PLT 234 12/27/2013      Chemistry      Component Value Date/Time   NA 143 12/27/2013 1300   NA 133* 05/19/2013 0542   K 5.1 12/27/2013 1300   K 4.3 05/19/2013 0542   CL 100 05/19/2013 0542   CL 106 06/06/2012 1041   CO2 32* 12/27/2013 1300   CO2 22 05/19/2013 0542   BUN 30.7* 12/27/2013 1300   BUN 23  05/19/2013 0542   CREATININE 1.3 12/27/2013 1300   CREATININE 1.01 05/19/2013 0542      Component Value Date/Time   CALCIUM 9.6 12/27/2013 1300   CALCIUM 8.6 05/19/2013 0542   ALKPHOS 98 12/27/2013 1300   ALKPHOS 212* 05/17/2013 2036   AST 17 12/27/2013 1300   AST 70* 05/17/2013 2036   ALT 16 12/27/2013 1300   ALT 79* 05/17/2013 2036   BILITOT 0.26 12/27/2013 1300   BILITOT 0.6 05/17/2013 2036      ASSESSMENT & PLAN:  Cancer of base of tongue Clinically, he has no signs of disease recurrence. Continue supportive care  History of malignant neoplasm of male breast Clinically, no evidence of disease  Melanoma of skin I have requested outside records. The patient has signed consent form for release of information. I recommend PET CT scan for staging and he agreed  Nausea without vomiting The  cause is unknown but could be due to gastritis due to recent stress. I gave him prescription Zofran to take as needed.  S/P gastrostomy He is dependent on his feeding tube. His feeding tube looks okay without signs of infection    Orders Placed This Encounter  Procedures  . NM PET Image Initial (PI) Whole Body    Standing Status: Future     Number of Occurrences:      Standing Expiration Date: 02/26/2015    Order Specific Question:  Reason for Exam (SYMPTOM  OR DIAGNOSIS REQUIRED)    Answer:  staging melanoma    Order Specific Question:  Preferred imaging location?    Answer:  [facility]   All questions were answered. The patient knows to call the clinic with any problems, questions or concerns. No barriers to learning was detected. I spent 40 minutes counseling the patient face to face. The total time spent in the appointment was 55 minutes and more than 50% was on counseling and review of test results     [facility], [place], MD 12/27/2013 9:38 PM

## 2013-12-27 NOTE — Assessment & Plan Note (Signed)
Clinically, he has no signs of disease recurrence. Continue supportive care 

## 2013-12-27 NOTE — Assessment & Plan Note (Signed)
He is dependent on his feeding tube. His feeding tube looks okay without signs of infection

## 2013-12-27 NOTE — Assessment & Plan Note (Signed)
The cause is unknown but could be due to gastritis due to recent stress. I gave him prescription Zofran to take as needed.

## 2013-12-28 ENCOUNTER — Encounter: Payer: Self-pay | Admitting: *Deleted

## 2013-12-28 NOTE — Progress Notes (Signed)
Faxed signed release of med information to Dermatologist Dr. Redmond Pulling at fax [phone] for reports of operative notes, pathology reports.  Pt had melanoma removed several weeks ago.

## 2014-01-23 ENCOUNTER — Ambulatory Visit (INDEPENDENT_AMBULATORY_CARE_PROVIDER_SITE_OTHER): Payer: Medicare Other | Admitting: Internal Medicine

## 2014-01-23 ENCOUNTER — Encounter: Payer: Self-pay | Admitting: Internal Medicine

## 2014-01-23 VITALS — BP 128/62 | HR 62 | Temp 97.7°F | Resp 20 | Ht 70.0 in | Wt 161.0 lb

## 2014-01-23 DIAGNOSIS — G500 Trigeminal neuralgia: Secondary | ICD-10-CM

## 2014-01-23 DIAGNOSIS — E785 Hyperlipidemia, unspecified: Secondary | ICD-10-CM

## 2014-01-23 DIAGNOSIS — I10 Essential (primary) hypertension: Secondary | ICD-10-CM

## 2014-01-23 DIAGNOSIS — R627 Adult failure to thrive: Secondary | ICD-10-CM

## 2014-01-23 DIAGNOSIS — C439 Malignant melanoma of skin, unspecified: Secondary | ICD-10-CM

## 2014-01-23 DIAGNOSIS — Z23 Encounter for immunization: Secondary | ICD-10-CM

## 2014-01-23 NOTE — Progress Notes (Signed)
Pre visit review using our clinic review tool, if applicable. No additional management support is needed unless otherwise documented below in the visit note. 

## 2014-01-23 NOTE — Progress Notes (Signed)
Subjective:    Patient ID: Jose [name], male    DOB: Dec 31, 1927, 80 y.o.   MRN: [id number]  HPI  80 year old patient who is seen today for follow-up. He  has an extensive oncological history and most recently has had resection of a melanoma involving his right facial area.he is scheduled for a PET scan later this month.  He is also scheduled to be seen in follow-up by dermatology He has a history of trigeminal neuralgia and remains symptomatic.  He remains on Tegretol. He has treated hypertension which has been stable off medications. His main complaint is some burning epigastric pain.  This  most often occursafter his final tube  Feeding at bedtime.  He remains on omeprazole.  Wt Readings from Last 3 Encounters:  01/23/14 161 lb (73.029 kg)  12/27/13 162 lb 9.6 oz (73.755 kg)  10/23/13 163 lb (73.936 kg)    Past Medical History  Diagnosis Date  . Blood transfusion     2006 had 4 units  . Arthritis     knees,back,shoulders  . Anxiety   . Right ureteral stone S/P URETERAL STENT 01-24-11  AND ESWL  03-01-11  . History of acute renal failure 01-23-11    DUE TO HYDRONEPHROSIS AND RIGHT STONE  . Normal cardiac stress test 09-25-2007  . Glaucoma   . Cataract immature   . Diverticulosis of colon   . History of GI diverticular bleed   . Hyperlipemia   . Impaired hearing NOT WEARING HIS BILATERL AIDS  . Urgency of urination     DUE TO URETERAL STONE AND STENT  . Frequency of urination   . Insomnia   . Anemia   . Chronic kidney disease     kidney stones  . GERD (gastroesophageal reflux disease)   . Hernia     inguinal  . Headache(784.0)     migraines  . Trigeminal neuralgia   . Hypertension   . History of radiation therapy 06/21/11-08/04/11    tongue/h/n 69.96 gy in 33 fxs  . History of chemotherapy 07/26/11    taxol/carbo completed/stopped 07/26/11  . 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 G tube feedings   . History of radiation therapy 06/21/11-08/04/11    base of tongue  .  Colon polyps   . Diverticulosis   . Male breast cancer 1988    S/P RIGHT MASTECTOMY W/ NODE DISSECTION AND CHEMO  . 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 Skin cancer 2002    melanoma- back  . Nausea without vomiting 12/27/2013  . Melanoma of skin 12/27/2013    History   Social History  . Marital Status: Married    Spouse Name: N/A    Number of Children: 2  . Years of Education: N/A   Occupational History  . retired    Social History Main Topics  . Smoking status: Former Smoker -- 0.25 packs/day for 30 years    Types: Cigarettes, Cigars    Quit date: 12/20/1980  . Smokeless tobacco: Never Used  . Alcohol Use: No  . Drug Use: No  . Sexual Activity: Yes   Other Topics Concern  . Not on file   Social History Narrative    Past Surgical History  Procedure Laterality Date  . Cystoscopy w/ ureteral stent placement  01/24/2011    Procedure: CYSTOSCOPY WITH RETROGRADE PYELOGRAM/URETERAL STENT PLACEMENT;  Surgeon: Fredricka Bonine, MD;  Location: [facility];  Service: Urology;  Laterality: Right;  .  Left leg surg for fx  AGE 80    patient states left thigh  . Craniectomy suboccipital for exploration / decompression cranial nerves  2003    5TH CRANIAL NERVE DECOMPRESSION  . Simple mastectomy  1988    MALE--- RIGHT BREAST W/ NODE DISSECTION  . Melanoma excision  10 YRS AGO    BACK AREA  . Extracorporeal shock wave lithotripsy  03-01-11    RIGHT  . Ureteroscopy  03/26/2011    Procedure: URETEROSCOPY;  Surgeon: Fredricka Bonine, MD;  Location: [facility];  Service: Urology;  Laterality: Right;  RIGHT URETEROSCOPY, HOLMIUM LASER AND STENT   . Examination under anesthesia  03/26/2011    Procedure: EXAM UNDER ANESTHESIA;  Surgeon: Fredricka Bonine, MD;  Location: [facility];  Service: Urology;  Laterality: N/A;  . Lymph node biopsy  04/21/2011    Procedure: LYMPH NODE BIOPSY;  Surgeon: Beckie Salts, MD;  Location: [place];   Service: ENT;  Laterality: Right;  . Gastrostomy tube placement    . Esophagoscopy with dilitation N/A 02/14/2013    Procedure: ESOPHAGOSCOPY WITH DILITATION;  Surgeon: Izora Gala, MD;  Location: [place];  Service: ENT;  Laterality: N/A;  Attempted  . Direct laryngoscopy  02/14/2013    Procedure: DIRECT LARYNGOSCOPY;  Surgeon: Izora Gala, MD;  Location: [facility];  Service: ENT;;  . Esophageal dilation  05/01/13    tread method esophageal dilation - Dr. Fredricka Bonine  . Diagnostic laproscopy  05/03/13    Dr. Margarette Canada at [place]    Family History  Problem Relation Age of Onset  . Heart disease Mother   . Heart disease Father   . Cancer Sister     "perineum"  . Stomach cancer Paternal Grandmother     Allergies  Allergen Reactions  . Hydromorphone Swelling and Other (See Comments)    phlebitis  . Morphine And Related Other (See Comments)    Phlebitis, makes pt out of it   . Codeine Other (See Comments)    Dizzy, mental status changes    Current Outpatient Prescriptions on File Prior to Visit  Medication Sig Dispense Refill  . carbamazepine (TEGRETOL) 200 MG tablet Take 1 tablet (200 mg total) by mouth 3 (three) times daily. 90 tablet 5  . Doxylamine Succinate, Sleep, (SLEEP AID PO) 2 capsules by PEG Tube route at bedtime as needed (sleep).    . Melatonin 1 MG TABS Take 1 mg by mouth at bedtime as needed.    . Nutritional Supplements (FEEDING SUPPLEMENT, JEVITY 1.5 CAL/FIBER,) LIQD 1.5 cans Jevity 1.5 QID with 130 ml free water before and after each bolus feeding as tolerated via feeding tube. 1422 mL   . omeprazole (PRILOSEC) 40 MG capsule Take 1 capsule (40 mg total) by mouth daily as needed. 90 capsule 3  . ondansetron (ZOFRAN) 4 MG/5ML solution Take 10 mLs (8 mg total) by mouth every 8 (eight) hours as needed for nausea or vomiting. 50 mL 6  . traMADol (ULTRAM) 50 MG tablet Take 1 tablet (50 mg total) by mouth every 8 (eight) hours as needed. 90  tablet 2  . Travoprost, BAK Free, (TRAVATAMN) 0.004 % SOLN ophthalmic solution Place 1 drop into both eyes at bedtime.     Current Facility-Administered Medications on File Prior to Visit  Medication Dose Route Frequency Provider Last Rate Last Dose  . topical emolient (BIAFINE) emulsion   Topical PRN Marye Round, MD  BP 128/62 mmHg  Pulse 62  Temp(Src) 97.7 F (36.5 C) (Oral)  Resp 20  Ht 5\' 10"  (1.778 m)  Wt 161 lb (73.029 kg)  BMI 23.10 kg/m2  SpO2 99%     Review of Systems  Constitutional: Positive for appetite change. Negative for fever, chills and fatigue.  HENT: Negative for congestion, dental problem, ear pain, hearing loss, sore throat, tinnitus, trouble swallowing and voice change.   Eyes: Negative for pain, discharge and visual disturbance.  Respiratory: Negative for cough, chest tightness, wheezing and stridor.   Cardiovascular: Negative for chest pain, palpitations and leg swelling.  Gastrointestinal: Positive for abdominal pain. Negative for nausea, vomiting, diarrhea, constipation, blood in stool and abdominal distention.  Genitourinary: Negative for urgency, hematuria, flank pain, discharge, difficulty urinating and genital sores.  Musculoskeletal: Negative for myalgias, back pain, joint swelling, arthralgias, gait problem and neck stiffness.  Skin: Negative for rash.  Neurological: Positive for headaches. Negative for dizziness, syncope, speech difficulty, weakness and numbness.  Hematological: Negative for adenopathy. Does not bruise/bleed easily.  Psychiatric/Behavioral: Negative for behavioral problems and dysphoric mood. The patient is not nervous/anxious.        Objective:   Physical Exam  Constitutional: He is oriented to person, place, and time. He appears well-developed.  HENT:  Head: Normocephalic.  Right Ear: External ear normal.  Left Ear: External ear normal.  Eyes: Conjunctivae and EOM are normal.  Neck: Normal range of motion.    Cardiovascular: Normal rate and normal heart sounds.   Pulmonary/Chest: Breath sounds normal. No respiratory distress. He has no wheezes. He has no rales.  Abdominal: Bowel sounds are normal.  Gastrostomy tube noted  Musculoskeletal: Normal range of motion. He exhibits no edema or tenderness.  Lymphadenopathy:    He has no cervical adenopathy.  Neurological: He is alert and oriented to person, place, and time.  Skin:  Recent surgical scar right facial area  Psychiatric: He has a normal mood and affect. His behavior is normal.          Assessment & Plan:   History of hypertension.  Remains normotensive off medication Melanoma.  Follow-up oncology with PET scan; follow-up dermatology History dyslipidemia Trigeminal neuralgia.  Recently stable  Recheck 6 months

## 2014-01-23 NOTE — Patient Instructions (Signed)
Follow-up oncology as scheduled Follow-up dermatology as scheduled  Please check your blood pressure on a regular basis.  If it is consistently greater than 150/90, please make an office appointment.  Return in 6 months for follow-up

## 2014-01-23 NOTE — Addendum Note (Signed)
Addended by: Marian Sorrow on: 01/23/2014 11:00 AM   Modules accepted: Orders

## 2014-01-24 ENCOUNTER — Telehealth: Payer: Self-pay | Admitting: Internal Medicine

## 2014-01-24 NOTE — Telephone Encounter (Signed)
emmi mailed  °

## 2014-01-31 ENCOUNTER — Ambulatory Visit (HOSPITAL_COMMUNITY)
Admission: RE | Admit: 2014-01-31 | Discharge: 2014-01-31 | Disposition: A | Discharge status: Home / Self Care | Payer: Medicare Other | Source: Ambulatory Visit | Attending: Hematology and Oncology | Admitting: Hematology and Oncology

## 2014-01-31 ENCOUNTER — Encounter (HOSPITAL_COMMUNITY): Payer: Self-pay

## 2014-01-31 ENCOUNTER — Other Ambulatory Visit: Payer: Self-pay | Admitting: Hematology and Oncology

## 2014-01-31 DIAGNOSIS — C439 Malignant melanoma of skin, unspecified: Secondary | ICD-10-CM

## 2014-01-31 DIAGNOSIS — Z85810 Personal history of malignant neoplasm of tongue: Secondary | ICD-10-CM | POA: Diagnosis present

## 2014-01-31 DIAGNOSIS — C4339 Malignant melanoma of other parts of face: Secondary | ICD-10-CM | POA: Diagnosis not present

## 2014-01-31 LAB — GLUCOSE, CAPILLARY: Glucose-Capillary: 99 mg/dL (ref 70–99)

## 2014-01-31 MED ORDER — FLUDEOXYGLUCOSE F - 18 (FDG) INJECTION
8.0100 | Freq: Once | INTRAVENOUS | Status: AC | PRN
  Administered 2014-01-31: 8.01 via INTRAVENOUS

## 2014-02-05 ENCOUNTER — Ambulatory Visit (HOSPITAL_BASED_OUTPATIENT_CLINIC_OR_DEPARTMENT_OTHER): Payer: Medicare Other | Admitting: Hematology and Oncology

## 2014-02-05 ENCOUNTER — Telehealth: Payer: Self-pay | Admitting: Hematology and Oncology

## 2014-02-05 ENCOUNTER — Other Ambulatory Visit (HOSPITAL_BASED_OUTPATIENT_CLINIC_OR_DEPARTMENT_OTHER): Payer: Medicare Other

## 2014-02-05 ENCOUNTER — Encounter: Payer: Self-pay | Admitting: Hematology and Oncology

## 2014-02-05 VITALS — BP 150/86 | HR 56 | Temp 98.4°F | Resp 18 | Ht 70.0 in | Wt 164.9 lb

## 2014-02-05 DIAGNOSIS — N4289 Other specified disorders of prostate: Secondary | ICD-10-CM | POA: Insufficient documentation

## 2014-02-05 DIAGNOSIS — Z125 Encounter for screening for malignant neoplasm of prostate: Secondary | ICD-10-CM

## 2014-02-05 DIAGNOSIS — C01 Malignant neoplasm of base of tongue: Secondary | ICD-10-CM

## 2014-02-05 DIAGNOSIS — N429 Disorder of prostate, unspecified: Secondary | ICD-10-CM

## 2014-02-05 DIAGNOSIS — C439 Malignant melanoma of skin, unspecified: Secondary | ICD-10-CM

## 2014-02-05 DIAGNOSIS — Z931 Gastrostomy status: Secondary | ICD-10-CM

## 2014-02-05 DIAGNOSIS — Z853 Personal history of malignant neoplasm of breast: Secondary | ICD-10-CM

## 2014-02-05 HISTORY — DX: Encounter for screening for malignant neoplasm of prostate: Z12.5

## 2014-02-05 NOTE — Progress Notes (Signed)
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 He received definitive concurrent chemoradiation with weekly Carboplatin/Taxol on 06/14/2011; and daily XRT 06/21/11. Chemotherapy completed on 07/26/11 (last dose held due to fatigue, anorexia, and mucositis). Radiation completed on 08/04/11. The patient also has background history of breast cancer who was treated with surgery and tamoxifen. He denies any palpable chest wall mass. On 12/20/2013, he underwent resection of the right temporal region for melanoma. PET CT scan on 01/31/2014 showed lesion in the prostate but no evidence of other cancer. INTERVAL HISTORY: Please see below for problem oriented charting. His skin has healed from recent surgery to the face. He denies new skin lesion. He is dependent on feeding tube for nutritional supplement. He denies any urination difficulties.  REVIEW OF SYSTEMS:   Constitutional: Denies fevers, chills or abnormal weight loss. In fact he has gained some weight. Eyes: Denies blurriness of vision Ears, nose, mouth, throat, and face: Denies mucositis or sore throat Respiratory: Denies cough, dyspnea or wheezes Cardiovascular: Denies palpitation, chest discomfort or lower extremity swelling Gastrointestinal:  Denies nausea, heartburn or change in bowel habits Skin: Denies abnormal skin rashes Lymphatics: Denies new lymphadenopathy or easy bruising Neurological:Denies numbness, tingling or new weaknesses Behavioral/Psych: Mood is stable, no new changes  All other systems were reviewed with the  patient and are negative.  I have reviewed the past medical history, past surgical history, social history and family history with the patient and they are unchanged from previous note.  ALLERGIES:  is allergic to hydromorphone; morphine and related; and codeine.  MEDICATIONS:  Current Outpatient Prescriptions  Medication Sig Dispense Refill  . carbamazepine (TEGRETOL) 200 MG tablet Take 1 tablet (200 mg total) by mouth 3 (three) times daily. 90 tablet 5  . Melatonin 1 MG TABS Take 1 mg by mouth at bedtime as needed.    . Nutritional Supplements (FEEDING SUPPLEMENT, JEVITY 1.5 CAL/FIBER,) LIQD 1.5 cans Jevity 1.5 QID with 130 ml free water before and after each bolus feeding as tolerated via feeding tube. 1422 mL   . omeprazole (PRILOSEC) 40 MG capsule Take 1 capsule (40 mg total) by mouth daily as needed. 90 capsule 3  . ondansetron (ZOFRAN) 4 MG/5ML solution Take 10 mLs (8 mg total) by mouth every 8 (eight) hours as needed for nausea or vomiting. 50 mL 6  . traMADol (ULTRAM) 50 MG tablet Take 1 tablet (50 mg total) by mouth every 8 (eight) hours as needed. 90 tablet 2  . Travoprost, BAK Free, (TRAVATAMN) 0.004 % SOLN ophthalmic solution Place 1 drop into both eyes at bedtime.    Marland Kitchen UNABLE TO FIND Med Name:  Z-Quil liquid at bedtime to help sleep.     No current facility-administered medications for this visit.   Facility-Administered Medications Ordered in Other Visits  Medication Dose Route Frequency Provider Last Rate Last Dose  . topical emolient (BIAFINE) emulsion   Topical PRN Marye Round, MD        PHYSICAL EXAMINATION: ECOG PERFORMANCE STATUS: 1 - Symptomatic but completely ambulatory  Filed Vitals:   02/05/14 1310  BP: 150/86  Pulse: 56  Temp: 98.4 F (36.9  C)  Resp: 18   Filed Weights   02/05/14 1310  Weight: 164 lb 14.4 oz (74.798 kg)    GENERAL:alert, no distress and comfortable. He looks frail and debilitated. SKIN: skin color, texture, turgor are normal,  no rashes or significant lesions EYES: normal, Conjunctiva are pink and non-injected, sclera clear OROPHARYNX:no exudate, no erythema and lips, buccal mucosa, and tongue normal . He is edentulous NECK: supple, thyroid normal size, non-tender, without nodularity LYMPH:  no palpable lymphadenopathy in the cervical, axillary or inguinal LUNGS: clear to auscultation and percussion with normal breathing effort HEART: regular rate & rhythm and no murmurs and no lower extremity edema ABDOMEN:abdomen soft, non-tender and normal bowel sounds. Feeding tube site looks okay Musculoskeletal:no cyanosis of digits and no clubbing  NEURO: alert & oriented x 3 with fluent speech, no focal motor/sensory deficits  LABORATORY DATA:  I have reviewed the data as listed    Component Value Date/Time   NA 143 12/27/2013 1300   NA 133* 05/19/2013 0542   K 5.1 12/27/2013 1300   K 4.3 05/19/2013 0542   CL 100 05/19/2013 0542   CL 106 06/06/2012 1041   CO2 32* 12/27/2013 1300   CO2 22 05/19/2013 0542   GLUCOSE 142* 12/27/2013 1300   GLUCOSE 127* 05/19/2013 0542   GLUCOSE 148* 06/06/2012 1041   BUN 30.7* 12/27/2013 1300   BUN 23 05/19/2013 0542   CREATININE 1.3 12/27/2013 1300   CREATININE 1.01 05/19/2013 0542   CALCIUM 9.6 12/27/2013 1300   CALCIUM 8.6 05/19/2013 0542   PROT 7.0 12/27/2013 1300   PROT 6.5 05/17/2013 2036   ALBUMIN 3.7 12/27/2013 1300   ALBUMIN 2.4* 05/17/2013 2036   AST 17 12/27/2013 1300   AST 70* 05/17/2013 2036   ALT 16 12/27/2013 1300   ALT 79* 05/17/2013 2036   ALKPHOS 98 12/27/2013 1300   ALKPHOS 212* 05/17/2013 2036   BILITOT 0.26 12/27/2013 1300   BILITOT 0.6 05/17/2013 2036   GFRNONAA 67* 05/19/2013 0542   GFRAA 77* 05/19/2013 0542    No results found for: SPEP, UPEP  Lab Results  Component Value Date   WBC 4.5 12/27/2013   NEUTROABS 3.1 12/27/2013   HGB 13.5 12/27/2013   HCT 40.1 12/27/2013   MCV 94.4 12/27/2013   PLT 234 12/27/2013      Chemistry       Component Value Date/Time   NA 143 12/27/2013 1300   NA 133* 05/19/2013 0542   K 5.1 12/27/2013 1300   K 4.3 05/19/2013 0542   CL 100 05/19/2013 0542   CL 106 06/06/2012 1041   CO2 32* 12/27/2013 1300   CO2 22 05/19/2013 0542   BUN 30.7* 12/27/2013 1300   BUN 23 05/19/2013 0542   CREATININE 1.3 12/27/2013 1300   CREATININE 1.01 05/19/2013 0542      Component Value Date/Time   CALCIUM 9.6 12/27/2013 1300   CALCIUM 8.6 05/19/2013 0542   ALKPHOS 98 12/27/2013 1300   ALKPHOS 212* 05/17/2013 2036   AST 17 12/27/2013 1300   AST 70* 05/17/2013 2036   ALT 16 12/27/2013 1300   ALT 79* 05/17/2013 2036   BILITOT 0.26 12/27/2013 1300   BILITOT 0.6 05/17/2013 2036       RADIOGRAPHIC STUDIES:I reviewed the PET/CT scan. I have personally reviewed the radiological images as listed and agreed with the findings in the report.   ASSESSMENT & PLAN:  Cancer of base of tongue Clinically, he has no signs of disease recurrence. Continue supportive care  History of malignant neoplasm of male breast Clinically, no evidence of disease    Melanoma of skin PET CT scan is negative. I recommend close follow-up with dermatologist. Recommend skin protection with sunscreen when he goes out.  S/P gastrostomy Feeding tube site looks okay without any signs of infection. The patient is fully dependent on feeding tube for nutritional needs. He will continue indefinitely. The patient has gained some weight since I saw him.  Prostate mass This is noted on his PET scan. I am concerned about undiagnosed prostate cancer. He agreed to proceed with PSA screening today and I will call the patient with test results.     Orders Placed This Encounter  Procedures  . PSA    Standing Status: Future     Number of Occurrences: 1     Standing Expiration Date: 03/12/2015   All questions were answered. The patient knows to call the clinic with any problems, questions or concerns. No barriers to learning was  detected. I spent 30 minutes counseling the patient face to face. The total time spent in the appointment was 40 minutes and more than 50% was on counseling and review of test results     [facility], [place], MD 02/05/2014 4:16 PM

## 2014-02-05 NOTE — Assessment & Plan Note (Signed)
PET CT scan is negative. I recommend close follow-up with dermatologist. Recommend skin protection with sunscreen when he goes out.

## 2014-02-05 NOTE — Assessment & Plan Note (Signed)
Clinically, he has no signs of disease recurrence. Continue supportive care 

## 2014-02-05 NOTE — Assessment & Plan Note (Signed)
This is noted on his PET scan. I am concerned about undiagnosed prostate cancer. He agreed to proceed with PSA screening today and I will call the patient with test results.

## 2014-02-05 NOTE — Assessment & Plan Note (Signed)
Clinically, no evidence of disease

## 2014-02-05 NOTE — Assessment & Plan Note (Signed)
Feeding tube site looks okay without any signs of infection. The patient is fully dependent on feeding tube for nutritional needs. He will continue indefinitely. The patient has gained some weight since I saw him. 

## 2014-02-05 NOTE — Telephone Encounter (Signed)
PT SENT BACK TO LAB AND GIVEN APPT SCHEDULE FOR MAY 2016.

## 2014-02-06 ENCOUNTER — Telehealth: Payer: Self-pay | Admitting: *Deleted

## 2014-02-06 ENCOUNTER — Encounter: Payer: Self-pay | Admitting: Hematology and Oncology

## 2014-02-06 ENCOUNTER — Other Ambulatory Visit: Payer: Self-pay | Admitting: Hematology and Oncology

## 2014-02-06 DIAGNOSIS — R972 Elevated prostate specific antigen [PSA]: Secondary | ICD-10-CM

## 2014-02-06 HISTORY — DX: Elevated prostate specific antigen (PSA): R97.20

## 2014-02-06 LAB — PSA: PSA: 10.63 ng/mL — ABNORMAL HIGH

## 2014-02-06 NOTE — Telephone Encounter (Signed)
Pt notified of below. Would prefer to be seen here.

## 2014-02-06 NOTE — Telephone Encounter (Signed)
-----   Message from Heath Lark, MD sent at 02/06/2014  4:00 PM EST ----- Regarding: PSA abnormal He needs urology consult. Does he want urologist here or does [facility]? ----- Message -----    From: [facility]    Sent: 02/06/2014  12:56 AM      To: Heath Lark, MD

## 2014-02-07 ENCOUNTER — Telehealth: Payer: Self-pay | Admitting: Hematology and Oncology

## 2014-02-07 NOTE — Telephone Encounter (Signed)
s.w. pt wife and advised on 12.14 @ 1:30pm...ok and aware

## 2014-02-07 NOTE — Telephone Encounter (Signed)
FAXED PT MEDICAL RECORDS TO [facility] °

## 2014-03-04 ENCOUNTER — Telehealth: Payer: Self-pay | Admitting: *Deleted

## 2014-03-04 NOTE — Telephone Encounter (Signed)
Wife state pt having terrible attack of his Trigeminal Neuralgia today and unable to make it to his appt at [facility] this afternoon.  Gave wife the number to [facility] to reschedule his appt.Marland Kitchen

## 2014-03-08 ENCOUNTER — Other Ambulatory Visit (HOSPITAL_COMMUNITY): Payer: Self-pay | Admitting: Interventional Radiology

## 2014-03-08 ENCOUNTER — Ambulatory Visit (HOSPITAL_COMMUNITY)
Admission: RE | Admit: 2014-03-08 | Discharge: 2014-03-08 | Disposition: A | Discharge status: Home / Self Care | Payer: Medicare Other | Source: Ambulatory Visit | Attending: Interventional Radiology | Admitting: Interventional Radiology

## 2014-03-08 DIAGNOSIS — K9420 Gastrostomy complication, unspecified: Secondary | ICD-10-CM

## 2014-03-08 DIAGNOSIS — K9423 Gastrostomy malfunction: Principal | ICD-10-CM

## 2014-03-28 ENCOUNTER — Ambulatory Visit: Payer: Medicare Other | Admitting: Radiation Oncology

## 2014-04-25 ENCOUNTER — Telehealth: Payer: Self-pay | Admitting: *Deleted

## 2014-04-25 ENCOUNTER — Ambulatory Visit
Admission: RE | Admit: 2014-04-25 | Discharge: 2014-04-25 | Disposition: A | Discharge status: Home / Self Care | Payer: Medicare Other | Source: Ambulatory Visit | Attending: Radiation Oncology | Admitting: Radiation Oncology

## 2014-04-25 ENCOUNTER — Encounter: Payer: Self-pay | Admitting: Radiation Oncology

## 2014-04-25 VITALS — BP 167/83 | HR 60 | Temp 97.9°F | Resp 20 | Ht 70.0 in | Wt 164.1 lb

## 2014-04-25 DIAGNOSIS — Z85810 Personal history of malignant neoplasm of tongue: Secondary | ICD-10-CM | POA: Insufficient documentation

## 2014-04-25 DIAGNOSIS — Z08 Encounter for follow-up examination after completed treatment for malignant neoplasm: Secondary | ICD-10-CM | POA: Diagnosis not present

## 2014-04-25 DIAGNOSIS — R972 Elevated prostate specific antigen [PSA]: Secondary | ICD-10-CM | POA: Diagnosis not present

## 2014-04-25 DIAGNOSIS — Z923 Personal history of irradiation: Secondary | ICD-10-CM | POA: Diagnosis not present

## 2014-04-25 DIAGNOSIS — C01 Malignant neoplasm of base of tongue: Secondary | ICD-10-CM

## 2014-04-25 DIAGNOSIS — Z79899 Other long term (current) drug therapy: Secondary | ICD-10-CM | POA: Diagnosis not present

## 2014-04-25 DIAGNOSIS — Z931 Gastrostomy status: Secondary | ICD-10-CM | POA: Diagnosis not present

## 2014-04-25 MED ORDER — LARYNGOSCOPY SOLUTION RAD-ONC
15.0000 mL | Freq: Once | TOPICAL | Status: AC
  Administered 2014-04-25: 15 mL via TOPICAL
  Filled 2014-04-25: qty 15

## 2014-04-25 MED ORDER — LARYNGOSCOPY SOLUTION RAD-ONC: 15.0000 mL | Freq: Once | TOPICAL | Status: DC

## 2014-04-25 NOTE — Telephone Encounter (Signed)
Called and spoke with wife and asked her if they wanted to come in today at 415pm instead of 445pm, we had a cancellation, she stated she gets off work at 330pm and will try and make it by then or just past 430pm,thanked me for the call 1:01 PM

## 2014-04-25 NOTE — Progress Notes (Signed)
Radiation Oncology         [phone] ________________________________  Name: Jose [name] MRN: [id number]  Date: 04/25/2014  DOB: 04-12-1927  Follow-Up Visit Note  CC: Nyoka Cowden, MD  Marletta Lor, MD  Diagnosis:    Base of tongue squamous cell carcinoma.  Interval Since Last Radiation:  The patient completed radiation treatment on 06/21/2011   Narrative:  The patient returns today for routine follow-up.  The patient states that he really does not feel very well overall. He has various complaints which have been in line with his previous status/complaints. Continues to use the feeding tube regularly. He has some thick saliva which he continues to have difficulties with. He states he has discussed possible surgery with ENT but he does not wish to proceed with this. The patient had a PET scan in recent months which did not show evidence of recurrence from his head and neck cancer. However activity was seen in the prostate and he also does have an elevated PSA level. The patient has seen urology regarding this. The patient and his wife state that they have not had a biopsy yet and they are not aware of any plans to do this currently. We will try to obtain records from urology regarding this issue. The patient is steadfast today in that he does not have any interest in radiation treatment for a diagnosis of prostate cancer or really for any other issues currently.                              ALLERGIES:  is allergic to hydromorphone; morphine and related; and codeine.  Meds: Current Outpatient Prescriptions  Medication Sig Dispense Refill  . carbamazepine (TEGRETOL) 200 MG tablet Take 1 tablet (200 mg total) by mouth 3 (three) times daily. 90 tablet 5  . Melatonin 1 MG TABS Take 1 mg by mouth at bedtime as needed.    . Nutritional Supplements (FEEDING SUPPLEMENT, JEVITY 1.5 CAL/FIBER,) LIQD 1.5 cans Jevity 1.5 QID with 130 ml free water before and after each bolus  feeding as tolerated via feeding tube. 1422 mL   . omeprazole (PRILOSEC) 40 MG capsule Take 1 capsule (40 mg total) by mouth daily as needed. 90 capsule 3  . ondansetron (ZOFRAN) 4 MG/5ML solution Take 10 mLs (8 mg total) by mouth every 8 (eight) hours as needed for nausea or vomiting. 50 mL 6  . traMADol (ULTRAM) 50 MG tablet Take 1 tablet (50 mg total) by mouth every 8 (eight) hours as needed. 90 tablet 2  . UNABLE TO FIND Med Name:  Z-Quil liquid at bedtime to help sleep.    . Travoprost, BAK Free, (TRAVATAMN) 0.004 % SOLN ophthalmic solution Place 1 drop into both eyes at bedtime. (Patient not taking: Reported on 04/25/2014)     Current Facility-Administered Medications  Medication Dose Route Frequency Provider Last Rate Last Dose  . laryngocopy solution for Rad-Onc  15 mL Topical Once Jodelle Gross, MD       Facility-Administered Medications Ordered in Other Encounters  Medication Dose Route Frequency Provider Last Rate Last Dose  . topical emolient (BIAFINE) emulsion   Topical PRN Jodelle Gross, MD        Physical Findings: The patient is in no acute distress. Patient is alert and oriented.  height is 5\' 10"  (1.778 m) and weight is 164 lb 1.6 oz (74.435 kg). His oral temperature is 97.9 F (36.6  C). His blood pressure is 167/83 and his pulse is 60. His respiration is 20 and oxygen saturation is 100%. .   Alert, in no acute distress No suspicious lesions within the oral cavity. The patient has dried secretions on the tongue. No cervical lymphadenopathy present. Some fibrosis is present within the neck bilaterally secondary to radiation effect.  Fiberoptic exam: After the use of topical anesthetic, the flexible laryngoscope was passed through the right near. Good visualization was obtained. No lesions or suspicious findings within the larynx, hypopharynx, oropharynx or nasopharynx. Fibrosis/shrinkage of the epiglottis is seen.  Lab Findings: Lab Results  Component Value Date   WBC 4.5  12/27/2013   HGB 13.5 12/27/2013   HCT 40.1 12/27/2013   MCV 94.4 12/27/2013   PLT 234 12/27/2013     Radiographic Findings: No results found.  Impression:    The patient remains clinically NED at this time for his head and neck cancer. We will obtain additional records from urology regarding the patient's hypermetabolic activity in the prostate and elevated PSA which has been worked up and managed by urology. Again, the patient has no interest in any further radiation treatment.  Plan:  The patient will return to our clinic in 6 months for ongoing follow-up.     Jodelle Gross, M.D., Ph.D.

## 2014-04-25 NOTE — Progress Notes (Signed)
Follow up s/p rad txs tongue 06/21/11-08/04/11, still not swallowing anything oraly looks like thrush on his tongue, thick saliva stated, uses baking soda rinses, [employer] mouth wash, sometimes biotene, had dry heaves before coming today was givven zofran prn resolved, jevity 1.2 cl can 5 cans daily via peg and 1 can ensure , with 126ml free water before and after feedings, was at 03/08/14  Replaced cap on g tube, using walker, slow steady gait,  No c/o pain 4:31 PM

## 2014-05-07 ENCOUNTER — Telehealth: Payer: Self-pay | Admitting: Internal Medicine

## 2014-05-07 MED ORDER — CARBAMAZEPINE 200 MG PO TABS: 200.0000 mg | ORAL_TABLET | Freq: Three times a day (TID) | ORAL | Status: DC

## 2014-05-07 NOTE — Telephone Encounter (Signed)
[facility] called for pt b/c he has run out of carbamazepine (TEGRETOL) 200 MG tablet 1/ TID [facility]

## 2014-05-07 NOTE — Telephone Encounter (Signed)
[facility]  Patient Name: KOWEN KLUTH  DOB: 1927-05-06    Initial Comment Caller states husband is out of seizure rx and needs refill Headache    Nurse Assessment  Nurse: Harlow Mares, RN, Suanne Marker Date/Time (Eastern Time): 05/07/2014 4:22:27 PM  Please select the assessment type ---Refill  Additional Documentation ---Caller states husband is out of seizure rx and needs refill. Headache currently. Med he takes is Carbamazepine 200mg  tid. Caller reports that he ran out this morning.  Does the patient have enough medication to last until the office opens? ---Unable to obtain loaner dose from Pharmacy  Does the client directives allow for assistance with medications after hours? ---Yes  Was the medication filled within the last 6 months? ---Yes  What is the name of the medication, dose and instructions as listed on the bottle? ---Carbamazepine 200mg  tid  Name of the physician as listed on the bottle. ---Dr. Nonie Hoyer  Pharmacy name and phone number where most recently filled. ---[facility] [phone]  Additional Documentation ---Spoke with Juliann Pulse (backline #) who took down the information and stated that she will get this sent in for patient. Advised caller and she voiced understanding.     Guidelines    Guideline Title Affirmed Question Affirmed Notes       Final Disposition User   Clinical Call Harlow Mares, RN, Suanne Marker

## 2014-05-07 NOTE — Telephone Encounter (Signed)
Left detailed message Rx sent to pharmacy. 

## 2014-05-23 ENCOUNTER — Emergency Department (HOSPITAL_COMMUNITY)
Admission: EM | Admit: 2014-05-23 | Discharge: 2014-05-23 | Disposition: A | Discharge status: Home / Self Care | Payer: Medicare Other | Attending: Emergency Medicine | Admitting: Emergency Medicine

## 2014-05-23 ENCOUNTER — Encounter (HOSPITAL_COMMUNITY): Payer: Self-pay | Admitting: *Deleted

## 2014-05-23 ENCOUNTER — Emergency Department (HOSPITAL_COMMUNITY): Payer: Medicare Other

## 2014-05-23 DIAGNOSIS — Z87891 Personal history of nicotine dependence: Secondary | ICD-10-CM | POA: Insufficient documentation

## 2014-05-23 DIAGNOSIS — Z923 Personal history of irradiation: Secondary | ICD-10-CM | POA: Diagnosis not present

## 2014-05-23 DIAGNOSIS — Z434 Encounter for attention to other artificial openings of digestive tract: Secondary | ICD-10-CM

## 2014-05-23 DIAGNOSIS — Z86010 Personal history of colonic polyps: Secondary | ICD-10-CM | POA: Diagnosis not present

## 2014-05-23 DIAGNOSIS — I129 Hypertensive chronic kidney disease with stage 1 through stage 4 chronic kidney disease, or unspecified chronic kidney disease: Secondary | ICD-10-CM | POA: Diagnosis not present

## 2014-05-23 DIAGNOSIS — Z8639 Personal history of other endocrine, nutritional and metabolic disease: Secondary | ICD-10-CM | POA: Insufficient documentation

## 2014-05-23 DIAGNOSIS — N189 Chronic kidney disease, unspecified: Secondary | ICD-10-CM | POA: Insufficient documentation

## 2014-05-23 DIAGNOSIS — Z79899 Other long term (current) drug therapy: Secondary | ICD-10-CM | POA: Insufficient documentation

## 2014-05-23 DIAGNOSIS — Z853 Personal history of malignant neoplasm of breast: Secondary | ICD-10-CM | POA: Diagnosis not present

## 2014-05-23 DIAGNOSIS — Z8739 Personal history of other diseases of the musculoskeletal system and connective tissue: Secondary | ICD-10-CM | POA: Insufficient documentation

## 2014-05-23 DIAGNOSIS — F419 Anxiety disorder, unspecified: Secondary | ICD-10-CM | POA: Insufficient documentation

## 2014-05-23 DIAGNOSIS — K219 Gastro-esophageal reflux disease without esophagitis: Secondary | ICD-10-CM | POA: Diagnosis not present

## 2014-05-23 DIAGNOSIS — K9423 Gastrostomy malfunction: Secondary | ICD-10-CM | POA: Insufficient documentation

## 2014-05-23 DIAGNOSIS — Z862 Personal history of diseases of the blood and blood-forming organs and certain disorders involving the immune mechanism: Secondary | ICD-10-CM | POA: Diagnosis not present

## 2014-05-23 DIAGNOSIS — Z85828 Personal history of other malignant neoplasm of skin: Secondary | ICD-10-CM | POA: Insufficient documentation

## 2014-05-23 DIAGNOSIS — T85528A Displacement of other gastrointestinal prosthetic devices, implants and grafts, initial encounter: Secondary | ICD-10-CM

## 2014-05-23 MED ORDER — IOHEXOL 300 MG/ML  SOLN: 40.0000 mL | Freq: Once | INTRAMUSCULAR | Status: DC | PRN

## 2014-05-23 NOTE — ED Notes (Signed)
Patient was drying off after a shower when he accidentally pulled his gastric tube out of his abdomen. Patient states that it just slipped out. He denies pain. Patient has tube at bedside and the balloon appears to be deflated. Patient states this has never happened before. He has had the tube for 2 years.

## 2014-05-23 NOTE — ED Notes (Signed)
MD at bedside. 

## 2014-05-23 NOTE — Discharge Instructions (Signed)
You may use G tube.   Follow up with your doctor.   Return to ER if G tube is dislodged, severe abdominal pain, vomiting.

## 2014-05-23 NOTE — ED Provider Notes (Signed)
CSN: [id number]     Arrival date & time 05/23/14  1903 History   First MD Initiated Contact with Patient 05/23/14 1905     Chief Complaint  Patient presents with  . GI Problem     (Consider location/radiation/quality/duration/timing/severity/associated sxs/prior Treatment) The history is provided by the patient and the spouse.  Jose [name] is a 81 y.o. male history anxiety, ureteral stone, tongue cancer now has gastric tube for the last 2 years here presenting with G-tube dislodgment. He was in the shower today and actually pulled out his gastric tube. He knows that the balloon was deflated. He gets all his medicines and tube feedings and doesn't take anything by mouth. Denies any vomiting or chest pain.    Past Medical History  Diagnosis Date  . Blood transfusion     2006 had 4 units  . Arthritis     knees,back,shoulders  . Anxiety   . Right ureteral stone S/P URETERAL STENT 01-24-11  AND ESWL  03-01-11  . History of acute renal failure 01-23-11    DUE TO HYDRONEPHROSIS AND RIGHT STONE  . Normal cardiac stress test 09-25-2007  . Glaucoma   . Cataract immature   . Diverticulosis of colon   . History of GI diverticular bleed   . Hyperlipemia   . Impaired hearing NOT WEARING HIS BILATERL AIDS  . Urgency of urination     DUE TO URETERAL STONE AND STENT  . Frequency of urination   . Insomnia   . Anemia   . Chronic kidney disease     kidney stones  . GERD (gastroesophageal reflux disease)   . Hernia     inguinal  . Headache(784.0)     migraines  . Trigeminal neuralgia   . Hypertension   . History of radiation therapy 06/21/11-08/04/11    tongue/h/n 69.96 gy in 33 fxs  . History of chemotherapy 07/26/11    taxol/carbo completed/stopped 07/26/11  . 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 G tube feedings   . History of radiation therapy 06/21/11-08/04/11    base of tongue  . Colon polyps   . Diverticulosis   . Male breast cancer 1988    S/P RIGHT MASTECTOMY W/ NODE DISSECTION AND  CHEMO  . 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 Skin cancer 2002    melanoma- back  . Nausea without vomiting 12/27/2013  . Melanoma of skin 12/27/2013  . Prostate cancer screening 02/05/2014  . Elevated PSA 02/06/2014   Past Surgical History  Procedure Laterality Date  . Cystoscopy w/ ureteral stent placement  01/24/2011    Procedure: CYSTOSCOPY WITH RETROGRADE PYELOGRAM/URETERAL STENT PLACEMENT;  Surgeon: Fredricka Bonine, MD;  Location: [facility];  Service: Urology;  Laterality: Right;  . Left leg surg for fx  AGE 60    patient states left thigh  . Craniectomy suboccipital for exploration / decompression cranial nerves  2003    5TH CRANIAL NERVE DECOMPRESSION  . Simple mastectomy  1988    MALE--- RIGHT BREAST W/ NODE DISSECTION  . Melanoma excision  10 YRS AGO    BACK AREA  . Extracorporeal shock wave lithotripsy  03-01-11    RIGHT  . Ureteroscopy  03/26/2011    Procedure: URETEROSCOPY;  Surgeon: Fredricka Bonine, MD;  Location: [facility];  Service: Urology;  Laterality: Right;  RIGHT URETEROSCOPY, HOLMIUM LASER AND STENT   . Examination under anesthesia  03/26/2011    Procedure: EXAM  UNDER ANESTHESIA;  Surgeon: Fredricka Bonine, MD;  Location[facility];  Service: Urology;  Laterality: N/A;  . Lymph node biopsy  04/21/2011    Procedure: LYMPH NODE BIOPSY;  Surgeon: Beckie Salts, MD;  Location: [place];  Service: ENT;  Laterality: Right;  . Gastrostomy tube placement    . Esophagoscopy with dilitation N/A 02/14/2013    Procedure: ESOPHAGOSCOPY WITH DILITATION;  Surgeon: Izora Gala, MD;  Location: [place];  Service: ENT;  Laterality: N/A;  Attempted  . Direct laryngoscopy  02/14/2013    Procedure: DIRECT LARYNGOSCOPY;  Surgeon: Izora Gala, MD;  Location: [facility] OR;  Service: ENT;;  . Esophageal dilation  05/01/13    tread method esophageal dilation - Dr. Fredricka Bonine  . Diagnostic laproscopy  05/03/13    Dr. Margarette Canada at [place]   Family History  Problem Relation Age of Onset  . Heart disease Mother   . Heart disease Father   . Cancer Sister     "perineum"  . Stomach cancer Paternal Grandmother    History  Substance Use Topics  . Smoking status: Former Smoker -- 0.25 packs/day for 30 years    Types: Cigarettes, Cigars    Quit date: 12/20/1980  . Smokeless tobacco: Never Used  . Alcohol Use: No    Review of Systems  Gastrointestinal: Positive for abdominal pain.      Allergies  Hydromorphone; Morphine and related; and Codeine  Home Medications   Prior to Admission medications   Medication Sig Start Date End Date Taking? Authorizing Provider  carbamazepine (TEGRETOL) 200 MG tablet Take 1 tablet (200 mg total) by mouth 3 (three) times daily. 05/07/14  Yes Marletta Lor, MD  Nutritional Supplements (FEEDING SUPPLEMENT, JEVITY 1.5 CAL/FIBER,) LIQD 1.5 cans Jevity 1.5 QID with 130 ml free water before and after each bolus feeding as tolerated via feeding tube. 09/12/13  Yes Heath Lark, MD  omeprazole (PRILOSEC) 40 MG capsule Take 1 capsule (40 mg total) by mouth daily as needed. 12/03/13  Yes Marletta Lor, MD  ondansetron [facility] [address]) 4 MG/5ML solution Take 10 mLs (8 mg total) by mouth every 8 (eight) hours as needed for nausea or vomiting. 12/27/13  Yes Heath Lark, MD  traMADol (ULTRAM) 50 MG tablet Take 1 tablet (50 mg total) by mouth every 8 (eight) hours as needed. 10/23/13  Yes Marletta Lor, MD  UNABLE TO FIND Med Name:  Z-Quil liquid by mouth at bedtime to help sleep.   Yes Historical Provider, MD  Melatonin 1 MG TABS Take 1 mg by mouth at bedtime as needed.    Historical Provider, MD  Travoprost, BAK Free, (TRAVATAMN) 0.004 % SOLN ophthalmic solution Place 1 drop into both eyes at bedtime. Patient not taking: Reported on 04/25/2014 01/27/11   Monika Salk, MD   BP 176/80 mmHg  Pulse 73  Temp(Src) 98 F (36.7 C) (Oral)  Resp 20  SpO2 95% Physical Exam   Constitutional: He is oriented to person, place, and time.  Chronically ill, NAD   HENT:  Head: Normocephalic.  Eyes: Pupils are equal, round, and reactive to light.  Neck: Normal range of motion.  Cardiovascular: Normal rate, regular rhythm and normal heart sounds.   Pulmonary/Chest: Effort normal and breath sounds normal. No respiratory distress. He has no wheezes. He has no rales.  Abdominal: Soft. Bowel sounds are normal.  G tube site with known tract. Abdomen nontender   Musculoskeletal: Normal range of  motion.  Neurological: He is alert and oriented to person, place, and time.  Skin: Skin is warm and dry.  Psychiatric: He has a normal mood and affect. His behavior is normal. Judgment and thought content normal.  Nursing note and vitals reviewed.   ED Course  Gastrostomy tube replacement Date/Time: 05/23/2014 7:39 PM Performed by: Wandra Arthurs Authorized by: Wandra Arthurs Consent: Verbal consent obtained. Risks and benefits: risks, benefits and alternatives were discussed Consent given by: patient Patient understanding: patient states understanding of the procedure being performed Patient consent: the patient's understanding of the procedure matches consent given Procedure consent: procedure consent matches procedure scheduled Relevant documents: relevant documents present and verified Patient identity confirmed: verbally with patient Preparation: Patient was prepped and draped in the usual sterile fashion. Local anesthesia used: no Patient sedated: no Patient tolerance: Patient tolerated the procedure well with no immediate complications Comments: 57 F G tube placed    (including critical care time) Labs Review Labs Reviewed - No data to display  Imaging Review Dg Abd 1 View  05/23/2014   CLINICAL DATA:  G-tube placement.  EXAM: ABDOMEN - 1 VIEW  COMPARISON:  None.  FINDINGS: Gastrostomy tube is present with contrast injected through the gastrostomy tube within the  fundus of the stomach. There is no bowel dilatation to suggest obstruction. There is no evidence of pneumoperitoneum, portal venous gas or pneumatosis. There are no pathologic calcifications along the expected course of the ureters.The osseous structures are unremarkable.  IMPRESSION: Gastrostomy tube is present with contrast injected through the gastrostomy tube within the fundus of the stomach.   Electronically Signed   By: Kathreen Devoid   On: 05/23/2014 19:49     EKG Interpretation None      MDM   Final diagnoses:  None   Jose [name] is a 81 y.o. male here with G tube dislodged. G tube was in for the last 2 years. I replaced G tube with 20 F g tube. Tube confirmed by xray. May use G tube.     Wandra Arthurs, MD 05/23/14 2003

## 2014-07-01 ENCOUNTER — Telehealth: Payer: Self-pay | Admitting: *Deleted

## 2014-07-01 DIAGNOSIS — C01 Malignant neoplasm of base of tongue: Secondary | ICD-10-CM

## 2014-07-01 MED ORDER — GLYCERIN (LAXATIVE) 2.1 G RE SUPP: RECTAL | Status: DC

## 2014-07-01 MED ORDER — LACTULOSE SOLN: 15.0000 mL | Freq: Two times a day (BID) | Status: DC

## 2014-07-01 MED ORDER — POLYETHYLENE GLYCOL 3350 17 G PO PACK: PACK | ORAL | Status: DC

## 2014-07-01 NOTE — Telephone Encounter (Signed)
Jose [name] called from work asking for dietician to resume his Osmolite feeding through [place].  Reports Jose [name] was constipated for the past four days.  She gave magnesium citrate and his bowels moved today but he had a hard time passing stool.  The Osmolite gave him diarrhea and was changed to Jevity about a year ago.  Has had constipation but not to this extent."  Does give water through tube feeding as he cannot drink anything by mouth.  Will notify Dr. Alvy Bimler for orders as he may need a bowel regimen Return call number for Jose [name] is [phone].

## 2014-07-01 NOTE — Telephone Encounter (Signed)
Called Jose [name] to confirm pharmacy and reviewed use of new laxatives ordered.

## 2014-07-01 NOTE — Telephone Encounter (Signed)
He will need addition of other laxatives:  1) miralax 1 packet daily via feeding tube 2) glycerin suppository daily PR until he has bowel movement 3) Lactulose 15 ml BID via feeing tube

## 2014-07-24 ENCOUNTER — Encounter: Payer: Self-pay | Admitting: Internal Medicine

## 2014-07-24 ENCOUNTER — Ambulatory Visit (INDEPENDENT_AMBULATORY_CARE_PROVIDER_SITE_OTHER): Payer: Medicare Other | Admitting: Internal Medicine

## 2014-07-24 VITALS — BP 122/80 | HR 68 | Temp 97.6°F | Resp 18 | Ht 70.0 in | Wt 167.0 lb

## 2014-07-24 DIAGNOSIS — C439 Malignant melanoma of skin, unspecified: Secondary | ICD-10-CM | POA: Diagnosis not present

## 2014-07-24 DIAGNOSIS — G500 Trigeminal neuralgia: Secondary | ICD-10-CM

## 2014-07-24 DIAGNOSIS — I10 Essential (primary) hypertension: Secondary | ICD-10-CM

## 2014-07-24 DIAGNOSIS — Z931 Gastrostomy status: Secondary | ICD-10-CM

## 2014-07-24 DIAGNOSIS — N4289 Other specified disorders of prostate: Secondary | ICD-10-CM

## 2014-07-24 DIAGNOSIS — N429 Disorder of prostate, unspecified: Secondary | ICD-10-CM

## 2014-07-24 MED ORDER — ESCITALOPRAM OXALATE 5 MG PO TABS: 5.0000 mg | ORAL_TABLET | Freq: Every day | ORAL | Status: DC

## 2014-07-24 NOTE — Progress Notes (Signed)
Subjective:    Patient ID: Jose [name], male    DOB: Nov 23, 1927, 81 y.o.   MRN: [id number]  HPI  Wt Readings from Last 3 Encounters:  07/24/14 167 lb (75.751 kg)  04/25/14 164 lb 1.6 oz (74.435 kg)  02/05/14 164 lb 14.4 oz (74.30 kg)    81 year old patient who is seen today in general follow-up.  He has an extensive oncological history and is followed closely by oncology.  He has a history of melanoma and a PET scan revealed a small nodule in the prostate area.  PSA was obtained and was elevated There is been some modest weight gain.  He is status post PEG and receives all his nutrition per gastrostomy. He and his wife both feel there has been some depression. He states due to some dizziness.  She has stopped all his blood pressure medications.  He has been treated with amlodipine 10 mg in the past as well as clonidine.  Blood pressure today is well controlled  Past Medical History  Diagnosis Date  . Blood transfusion     2006 had 4 units  . Arthritis     knees,back,shoulders  . Anxiety   . Right ureteral stone S/P URETERAL STENT 01-24-11  AND ESWL  03-01-11  . History of acute renal failure 01-23-11    DUE TO HYDRONEPHROSIS AND RIGHT STONE  . Normal cardiac stress test 09-25-2007  . Glaucoma   . Cataract immature   . Diverticulosis of colon   . History of GI diverticular bleed   . Hyperlipemia   . Impaired hearing NOT WEARING HIS BILATERL AIDS  . Urgency of urination     DUE TO URETERAL STONE AND STENT  . Frequency of urination   . Insomnia   . Anemia   . Chronic kidney disease     kidney stones  . GERD (gastroesophageal reflux disease)   . Hernia     inguinal  . Headache(784.0)     migraines  . Trigeminal neuralgia   . Hypertension   . History of radiation therapy 06/21/11-08/04/11    tongue/h/n 69.96 gy in 33 fxs  . History of chemotherapy 07/26/11    taxol/carbo completed/stopped 07/26/11  . 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 G tube feedings   . History of  radiation therapy 06/21/11-08/04/11    base of tongue  . Colon polyps   . Diverticulosis   . Male breast cancer 1988    S/P RIGHT MASTECTOMY W/ NODE DISSECTION AND CHEMO  . 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 Skin cancer 2002    melanoma- back  . Nausea without vomiting 12/27/2013  . Melanoma of skin 12/27/2013  . Prostate cancer screening 02/05/2014  . Elevated PSA 02/06/2014    History   Social History  . Marital Status: Married    Spouse Name: N/A  . Number of Children: 2  . Years of Education: N/A   Occupational History  . retired    Social History Main Topics  . Smoking status: Former Smoker -- 0.25 packs/day for 30 years    Types: Cigarettes, Cigars    Quit date: 12/20/1980  . Smokeless tobacco: Never Used  . Alcohol Use: No  . Drug Use: No  . Sexual Activity: Yes   Other Topics Concern  . Not on file   Social History Narrative    Past Surgical History  Procedure Laterality Date  . Cystoscopy w/ ureteral stent placement  01/24/2011    Procedure: CYSTOSCOPY WITH RETROGRADE PYELOGRAM/URETERAL STENT PLACEMENT;  Surgeon: Fredricka Bonine, MD;  Location: [facility];  Service: Urology;  Laterality: Right;  . Left leg surg for fx  AGE 55    patient states left thigh  . Craniectomy suboccipital for exploration / decompression cranial nerves  2003    5TH CRANIAL NERVE DECOMPRESSION  . Simple mastectomy  1988    MALE--- RIGHT BREAST W/ NODE DISSECTION  . Melanoma excision  10 YRS AGO    BACK AREA  . Extracorporeal shock wave lithotripsy  03-01-11    RIGHT  . Ureteroscopy  03/26/2011    Procedure: URETEROSCOPY;  Surgeon: Fredricka Bonine, MD;  Location: [facility];  Service: Urology;  Laterality: Right;  RIGHT URETEROSCOPY, HOLMIUM LASER AND STENT   . Examination under anesthesia  03/26/2011    Procedure: EXAM UNDER ANESTHESIA;  Surgeon: Fredricka Bonine, MD;  Location: [facility];  Service: Urology;   Laterality: N/A;  . Lymph node biopsy  04/21/2011    Procedure: LYMPH NODE BIOPSY;  Surgeon: Beckie Salts, MD;  Location: [place];  Service: ENT;  Laterality: Right;  . Gastrostomy tube placement    . Esophagoscopy with dilitation N/A 02/14/2013    Procedure: ESOPHAGOSCOPY WITH DILITATION;  Surgeon: Izora Gala, MD;  Location: [place];  Service: ENT;  Laterality: N/A;  Attempted  . Direct laryngoscopy  02/14/2013    Procedure: DIRECT LARYNGOSCOPY;  Surgeon: Izora Gala, MD;  Location: [facility] OR;  Service: ENT;;  . Esophageal dilation  05/01/13    tread method esophageal dilation - Dr. Fredricka Bonine  . Diagnostic laproscopy  05/03/13    Dr. Margarette Canada at [place]    Family History  Problem Relation Age of Onset  . Heart disease Mother   . Heart disease Father   . Cancer Sister     "perineum"  . Stomach cancer Paternal Grandmother     Allergies  Allergen Reactions  . Hydromorphone Swelling and Other (See Comments)    phlebitis  . Morphine And Related Other (See Comments)    Phlebitis, makes pt out of it   . Codeine Other (See Comments)    Dizzy, mental status changes    Current Outpatient Prescriptions on File Prior to Visit  Medication Sig Dispense Refill  . carbamazepine (TEGRETOL) 200 MG tablet Take 1 tablet (200 mg total) by mouth 3 (three) times daily. 90 tablet 1  . Glycerin, Adult, 2.1 G SUPP Place one suppository per rectum daily as needed until BM 25 suppository 1  . Lactulose SOLN 15 mLs by Does not apply route 2 (two) times daily. 1000 mL 1  . Nutritional Supplements (FEEDING SUPPLEMENT, JEVITY 1.5 CAL/FIBER,) LIQD 1.5 cans Jevity 1.5 QID with 130 ml free water before and after each bolus feeding as tolerated via feeding tube. 1422 mL   . omeprazole (PRILOSEC) 40 MG capsule Take 1 capsule (40 mg total) by mouth daily as needed. 90 capsule 3  . ondansetron (ZOFRAN) 4 MG/5ML solution Take 10 mLs (8 mg total) by mouth every 8 (eight) hours as needed  for nausea or vomiting. 50 mL 6  . polyethylene glycol (MIRALAX / GLYCOLAX) packet Instill 1 pack daily via tube 30 each 1  . traMADol (ULTRAM) 50 MG tablet Take 1 tablet (50 mg total) by mouth every 8 (eight) hours as needed. 90 tablet 2  . Travoprost, BAK Free, (TRAVATAMN) 0.004 % SOLN ophthalmic solution  Place 1 drop into both eyes at bedtime.    Marland Kitchen UNABLE TO FIND Med Name:  Z-Quil liquid by mouth at bedtime to help sleep.     Current Facility-Administered Medications on File Prior to Visit  Medication Dose Route Frequency Provider Last Rate Last Dose  . topical emolient (BIAFINE) emulsion   Topical PRN Kyung Rudd, MD        BP 122/80 mmHg  Pulse 68  Temp(Src) 97.6 F (36.4 C) (Oral)  Resp 18  Ht 5\' 10"  (1.778 m)  Wt 167 lb (75.751 kg)  BMI 23.96 kg/m2  SpO2 98%        Review of Systems  Constitutional: Positive for fatigue. Negative for fever, chills and appetite change.  HENT: Negative for congestion, dental problem, ear pain, hearing loss, sore throat, tinnitus, trouble swallowing and voice change.   Eyes: Negative for pain, discharge and visual disturbance.  Respiratory: Negative for cough, chest tightness, wheezing and stridor.   Cardiovascular: Negative for chest pain, palpitations and leg swelling.  Gastrointestinal: Negative for nausea, vomiting, abdominal pain, diarrhea, constipation, blood in stool and abdominal distention.  Genitourinary: Negative for urgency, hematuria, flank pain, discharge, difficulty urinating and genital sores.  Musculoskeletal: Positive for gait problem. Negative for myalgias, back pain, joint swelling, arthralgias and neck stiffness.  Skin: Negative for rash.  Neurological: Positive for weakness. Negative for dizziness, syncope, speech difficulty, numbness and headaches.  Hematological: Negative for adenopathy. Does not bruise/bleed easily.  Psychiatric/Behavioral: Positive for decreased concentration. Negative for behavioral problems and  dysphoric mood. The patient is not nervous/anxious.        Objective:   Physical Exam  Constitutional: He is oriented to person, place, and time. He appears well-developed.  Elderly No distress Blood pressure 122/80 Ambulates with a walker  HENT:  Head: Normocephalic.  Right Ear: External ear normal.  Left Ear: External ear normal.  Eyes: Conjunctivae and EOM are normal.  Neck: Normal range of motion.  Cardiovascular: Normal rate and normal heart sounds.   Pulmonary/Chest: Breath sounds normal.  Abdominal: Bowel sounds are normal.  PEG tube in place  Musculoskeletal: Normal range of motion. He exhibits no edema or tenderness.  Neurological: He is alert and oriented to person, place, and time.  Psychiatric: He has a normal mood and affect. His behavior is normal.          Assessment & Plan:   Clinical depression.  Will start on Lexapro 5 mg daily.  Recheck in 2 months Hypertension.  Presently normotensive off self discontinuation of medications.  Will continue to observe off therapy.  Reassess 2 months Trigeminal neuralgia, stable.  Continues to tolerate Tegretol without difficulty Dyslipidemia

## 2014-07-24 NOTE — Progress Notes (Signed)
Pre visit review using our clinic review tool, if applicable. No additional management support is needed unless otherwise documented below in the visit note. 

## 2014-07-24 NOTE — Patient Instructions (Signed)
Return in 2 months for follow-up

## 2014-08-06 ENCOUNTER — Ambulatory Visit (HOSPITAL_BASED_OUTPATIENT_CLINIC_OR_DEPARTMENT_OTHER): Payer: Medicare Other | Admitting: Hematology and Oncology

## 2014-08-06 ENCOUNTER — Telehealth: Payer: Self-pay | Admitting: Hematology and Oncology

## 2014-08-06 ENCOUNTER — Encounter: Payer: Self-pay | Admitting: Hematology and Oncology

## 2014-08-06 VITALS — BP 187/87 | HR 58 | Temp 98.3°F | Resp 18 | Ht 70.0 in | Wt 171.7 lb

## 2014-08-06 DIAGNOSIS — R972 Elevated prostate specific antigen [PSA]: Secondary | ICD-10-CM

## 2014-08-06 DIAGNOSIS — C439 Malignant melanoma of skin, unspecified: Secondary | ICD-10-CM

## 2014-08-06 DIAGNOSIS — C01 Malignant neoplasm of base of tongue: Secondary | ICD-10-CM

## 2014-08-06 DIAGNOSIS — Z931 Gastrostomy status: Secondary | ICD-10-CM

## 2014-08-06 DIAGNOSIS — R110 Nausea: Secondary | ICD-10-CM

## 2014-08-06 DIAGNOSIS — I10 Essential (primary) hypertension: Secondary | ICD-10-CM

## 2014-08-06 MED ORDER — METOCLOPRAMIDE HCL 5 MG/5ML PO SOLN: 5.0000 mg | Freq: Three times a day (TID) | ORAL | Status: DC

## 2014-08-06 NOTE — Telephone Encounter (Signed)
per pof to sch pt appt-gave pt copy of sch °

## 2014-08-06 NOTE — Assessment & Plan Note (Signed)
His blood pressure is elevated today. Could be due to anxiety. His blood pressure medicine was discontinued recently. I recommend recheck at home. If remain elevated, I recommend the patient to call his primary care doctor for medication adjustment.

## 2014-08-06 NOTE — Assessment & Plan Note (Signed)
This is noted on his PET scan. I am concerned about undiagnosed prostate cancer. PSA is elevated and he is currently followed by urologist. He denies major urinary symptoms

## 2014-08-06 NOTE — Assessment & Plan Note (Signed)
Clinically, he has no signs of disease recurrence. Continue supportive care

## 2014-08-06 NOTE — Assessment & Plan Note (Signed)
Feeding tube site looks okay without any signs of infection. The patient is fully dependent on feeding tube for nutritional needs. He will continue indefinitely. The patient has gained some weight since I saw him.

## 2014-08-06 NOTE — Assessment & Plan Note (Signed)
The cause is unknown but could be due to gastritis due to recent stress. I gave him prescription Reglan to take 3 times a day before meals.

## 2014-08-06 NOTE — Progress Notes (Signed)
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 He received definitive concurrent chemoradiation with weekly Carboplatin/Taxol on 06/14/2011; and daily XRT 06/21/11. Chemotherapy completed on 07/26/11 (last dose held due to fatigue, anorexia, and mucositis). Radiation completed on 08/04/11. The patient also has background history of breast cancer who was treated with surgery and tamoxifen. He denies any palpable chest wall mass. On 12/20/2013, he underwent resection of the right temporal region for melanoma. PET CT scan on 01/31/2014 showed lesion in the prostate but no evidence of other cancer. INTERVAL HISTORY: Please see below for problem oriented charting. He denies new skin lesion. He is dependent on feeding tube for nutritional supplement. He denies any urination difficulties. He was referred to see a urologist and is on observation. He denies major dysuria or urinary frequency. He complained of some dizziness and occasional nausea with dry heaves. He complained of burning sensation in his stomach which is similar to his prior presentation. He has occasional nausea but no vomiting. He has gained 10 pounds of weight since I saw him. REVIEW OF SYSTEMS:   Constitutional: Denies fevers, chills or abnormal weight loss Eyes: Denies blurriness of vision Respiratory: Denies cough, dyspnea or wheezes Cardiovascular: Denies palpitation, chest discomfort or lower extremity swelling Skin: Denies abnormal skin rashes Lymphatics: Denies new lymphadenopathy or easy bruising Neurological:Denies  numbness, tingling or new weaknesses Behavioral/Psych: Mood is stable, no new changes  All other systems were reviewed with the patient and are negative.  I have reviewed the past medical history, past surgical history, social history and family history with the patient and they are unchanged from previous note.  ALLERGIES:  is allergic to hydromorphone; morphine and related; and codeine.  MEDICATIONS:  Current Outpatient Prescriptions  Medication Sig Dispense Refill  . carbamazepine (TEGRETOL) 200 MG tablet Take 1 tablet (200 mg total) by mouth 3 (three) times daily. 90 tablet 1  . escitalopram (LEXAPRO) 5 MG tablet Take 1 tablet (5 mg total) by mouth daily. 90 tablet 3  . Glycerin, Adult, 2.1 G SUPP Place one suppository per rectum daily as needed until BM 25 suppository 1  . Lactulose SOLN 15 mLs by Does not apply route 2 (two) times daily. 1000 mL 1  . Nutritional Supplements (FEEDING SUPPLEMENT, JEVITY 1.5 CAL/FIBER,) LIQD 1.5 cans Jevity 1.5 QID with 130 ml free water before and after each bolus feeding as tolerated via feeding tube. 1422 mL   . omeprazole (PRILOSEC) 40 MG capsule Take 1 capsule (40 mg total) by mouth daily as needed. 90 capsule 3  . ondansetron (ZOFRAN) 4 MG/5ML solution Take 10 mLs (8 mg total) by mouth every 8 (eight) hours as needed for nausea or vomiting. 50 mL 6  . polyethylene glycol (MIRALAX / GLYCOLAX) packet Instill 1 pack daily via tube 30 each 1  . traMADol (ULTRAM) 50 MG tablet Take 1 tablet (50 mg total) by mouth every 8 (eight) hours as needed. 90 tablet 2  . Travoprost, BAK Free, (TRAVATAMN) 0.004 % SOLN ophthalmic solution Place 1 drop into both eyes at bedtime.    Marland Kitchen UNABLE TO FIND Med Name:  Z-Quil liquid by mouth at bedtime to  help sleep.    . metoCLOPramide (REGLAN) 5 MG/5ML solution Take 5 mLs (5 mg total) by mouth 3 (three) times daily before meals. 240 mL 0   No current facility-administered medications for this visit.   Facility-Administered  Medications Ordered in Other Visits  Medication Dose Route Frequency Provider Last Rate Last Dose  . topical emolient (BIAFINE) emulsion   Topical PRN Kyung Rudd, MD        PHYSICAL EXAMINATION: ECOG PERFORMANCE STATUS: 1 - Symptomatic but completely ambulatory  Filed Vitals:   08/06/14 1453  BP: 187/87  Pulse: 58  Temp: 98.3 F (36.8 C)  Resp: 18   Filed Weights   08/06/14 1453  Weight: 171 lb 11.2 oz (77.883 kg)    GENERAL:alert, no distress and comfortable SKIN: skin color, texture, turgor are normal, no rashes or significant lesions EYES: normal, Conjunctiva are pink and non-injected, sclera clear OROPHARYNX:no exudate, no erythema and lips, buccal mucosa, and tongue normal  NECK: supple, thyroid normal size, non-tender, without nodularity LYMPH:  no palpable lymphadenopathy in the cervical, axillary or inguinal LUNGS: clear to auscultation and percussion with normal breathing effort HEART: regular rate & rhythm and no murmurs and no lower extremity edema ABDOMEN:abdomen soft, non-tender and normal bowel sounds. Feeding tube site looks okay Musculoskeletal:no cyanosis of digits and no clubbing  NEURO: alert & oriented x 3 with fluent speech, no focal motor/sensory deficits  LABORATORY DATA:  I have reviewed the data as listed    Component Value Date/Time   NA 143 12/27/2013 1300   NA 133* 05/19/2013 0542   K 5.1 12/27/2013 1300   K 4.3 05/19/2013 0542   CL 100 05/19/2013 0542   CL 106 06/06/2012 1041   CO2 32* 12/27/2013 1300   CO2 22 05/19/2013 0542   GLUCOSE 142* 12/27/2013 1300   GLUCOSE 127* 05/19/2013 0542   GLUCOSE 148* 06/06/2012 1041   BUN 30.7* 12/27/2013 1300   BUN 23 05/19/2013 0542   CREATININE 1.3 12/27/2013 1300   CREATININE 1.01 05/19/2013 0542   CALCIUM 9.6 12/27/2013 1300   CALCIUM 8.6 05/19/2013 0542   PROT 7.0 12/27/2013 1300   PROT 6.5 05/17/2013 2036   ALBUMIN 3.7 12/27/2013 1300   ALBUMIN 2.4* 05/17/2013 2036   AST 17 12/27/2013 1300    AST 70* 05/17/2013 2036   ALT 16 12/27/2013 1300   ALT 79* 05/17/2013 2036   ALKPHOS 98 12/27/2013 1300   ALKPHOS 212* 05/17/2013 2036   BILITOT 0.26 12/27/2013 1300   BILITOT 0.6 05/17/2013 2036   GFRNONAA 67* 05/19/2013 0542   GFRAA 77* 05/19/2013 0542    No results found for: SPEP, UPEP  Lab Results  Component Value Date   WBC 4.5 12/27/2013   NEUTROABS 3.1 12/27/2013   HGB 13.5 12/27/2013   HCT 40.1 12/27/2013   MCV 94.4 12/27/2013   PLT 234 12/27/2013      Chemistry      Component Value Date/Time   NA 143 12/27/2013 1300   NA 133* 05/19/2013 0542   K 5.1 12/27/2013 1300   K 4.3 05/19/2013 0542   CL 100 05/19/2013 0542   CL 106 06/06/2012 1041   CO2 32* 12/27/2013 1300   CO2 22 05/19/2013 0542   BUN 30.7* 12/27/2013 1300   BUN 23 05/19/2013 0542   CREATININE 1.3 12/27/2013 1300   CREATININE 1.01 05/19/2013 0542      Component Value Date/Time   CALCIUM 9.6 12/27/2013 1300   CALCIUM 8.6 05/19/2013 0542   ALKPHOS  98 12/27/2013 1300   ALKPHOS 212* 05/17/2013 2036   AST 17 12/27/2013 1300   AST 70* 05/17/2013 2036   ALT 16 12/27/2013 1300   ALT 79* 05/17/2013 2036   BILITOT 0.26 12/27/2013 1300   BILITOT 0.6 05/17/2013 2036     ASSESSMENT & PLAN:  Cancer of base of tongue Clinically, he has no signs of disease recurrence. Continue supportive care   Melanoma of skin PET CT scan in 2015 was negative. I recommend close follow-up with dermatologist. Recommend skin protection with sunscreen when he goes out.   S/P gastrostomy Feeding tube site looks okay without any signs of infection. The patient is fully dependent on feeding tube for nutritional needs. He will continue indefinitely. The patient has gained some weight since I saw him.   Nausea without vomiting The cause is unknown but could be due to gastritis due to recent stress. I gave him prescription Reglan to take 3 times a day before meals.   Essential hypertension His blood pressure is  elevated today. Could be due to anxiety. His blood pressure medicine was discontinued recently. I recommend recheck at home. If remain elevated, I recommend the patient to call his primary care doctor for medication adjustment.   Elevated PSA This is noted on his PET scan. I am concerned about undiagnosed prostate cancer. PSA is elevated and he is currently followed by urologist. He denies major urinary symptoms    Orders Placed This Encounter  Procedures  . CBC with Differential/Platelet    Standing Status: Future     Number of Occurrences:      Standing Expiration Date: 09/10/2015  . Comprehensive metabolic panel    Standing Status: Future     Number of Occurrences:      Standing Expiration Date: 09/10/2015  . TSH    Standing Status: Future     Number of Occurrences:      Standing Expiration Date: 09/10/2015   All questions were answered. The patient knows to call the clinic with any problems, questions or concerns. No barriers to learning was detected. I spent 25 minutes counseling the patient face to face. The total time spent in the appointment was 30 minutes and more than 50% was on counseling and review of test results     [facility], [place], MD 08/06/2014 3:24 PM

## 2014-08-06 NOTE — Assessment & Plan Note (Signed)
PET CT scan in 2015 was negative. I recommend close follow-up with dermatologist. Recommend skin protection with sunscreen when he goes out.

## 2014-09-24 ENCOUNTER — Ambulatory Visit (INDEPENDENT_AMBULATORY_CARE_PROVIDER_SITE_OTHER): Payer: Medicare Other | Admitting: Internal Medicine

## 2014-09-24 ENCOUNTER — Encounter: Payer: Self-pay | Admitting: Internal Medicine

## 2014-09-24 VITALS — BP 148/80 | HR 72 | Temp 99.0°F | Resp 20 | Ht 70.0 in | Wt 171.0 lb

## 2014-09-24 DIAGNOSIS — I10 Essential (primary) hypertension: Secondary | ICD-10-CM

## 2014-09-24 DIAGNOSIS — Z931 Gastrostomy status: Secondary | ICD-10-CM

## 2014-09-24 NOTE — Progress Notes (Signed)
Pre visit review using our clinic review tool, if applicable. No additional management support is needed unless otherwise documented below in the visit note. 

## 2014-09-24 NOTE — Patient Instructions (Signed)
No change in medications  Return in 4 months for follow-up  Limit your sodium (Salt) intake  Please check your blood pressure on a regular basis.  If it is consistently greater than 150/90, please make an office appointment.

## 2014-09-24 NOTE — Progress Notes (Signed)
Subjective:    Patient ID: Jose [name], male    DOB: 11-Jul-1927, 81 y.o.   MRN: [id number]  HPI  BP Readings from Last 3 Encounters:  09/24/14 148/80  08/06/14 187/87  07/24/14 122/80    Wt Readings from Last 3 Encounters:  09/24/14 171 lb (77.565 kg)  08/06/14 171 lb 11.2 oz (77.883 kg)  07/24/14 167 lb (75.73 kg)   81 year old patient who is seen today in follow-up.  He was placed on Lexapro approximately 2 months ago due to clinical depression.  He has had a very nice clinical response and today feels quite well.  He is accompanied by his wife who also feels he is back to baseline.  He has essential hypertension which has been stable off medications, although elevated at his oncology follow-up 2 months ago  Past Medical History  Diagnosis Date  . Blood transfusion     2006 had 4 units  . Arthritis     knees,back,shoulders  . Anxiety   . Right ureteral stone S/P URETERAL STENT 01-24-11  AND ESWL  03-01-11  . History of acute renal failure 01-23-11    DUE TO HYDRONEPHROSIS AND RIGHT STONE  . Normal cardiac stress test 09-25-2007  . Glaucoma   . Cataract immature   . Diverticulosis of colon   . History of GI diverticular bleed   . Hyperlipemia   . Impaired hearing NOT WEARING HIS BILATERL AIDS  . Urgency of urination     DUE TO URETERAL STONE AND STENT  . Frequency of urination   . Insomnia   . Anemia   . Chronic kidney disease     kidney stones  . GERD (gastroesophageal reflux disease)   . Hernia     inguinal  . Headache(784.0)     migraines  . Trigeminal neuralgia   . Hypertension   . History of radiation therapy 06/21/11-08/04/11    tongue/h/n 69.96 gy in 33 fxs  . History of chemotherapy 07/26/11    taxol/carbo completed/stopped 07/26/11  . 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 G tube feedings   . History of radiation therapy 06/21/11-08/04/11    base of tongue  . Colon polyps   . Diverticulosis   . Male breast cancer 1988    S/P RIGHT MASTECTOMY W/ NODE  DISSECTION AND CHEMO  . 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 Skin cancer 2002    melanoma- back  . Nausea without vomiting 12/27/2013  . Melanoma of skin 12/27/2013  . Prostate cancer screening 02/05/2014  . Elevated PSA 02/06/2014    History   Social History  . Marital Status: Married    Spouse Name: N/A  . Number of Children: 2  . Years of Education: N/A   Occupational History  . retired    Social History Main Topics  . Smoking status: Former Smoker -- 0.25 packs/day for 30 years    Types: Cigarettes, Cigars    Quit date: 12/20/1980  . Smokeless tobacco: Never Used  . Alcohol Use: No  . Drug Use: No  . Sexual Activity: Yes   Other Topics Concern  . Not on file   Social History Narrative    Past Surgical History  Procedure Laterality Date  . Cystoscopy w/ ureteral stent placement  01/24/2011    Procedure: CYSTOSCOPY WITH RETROGRADE PYELOGRAM/URETERAL STENT PLACEMENT;  Surgeon: Fredricka Bonine, MD;  Location: [facility];  Service: Urology;  Laterality: Right;  . Left  leg surg for fx  AGE 25    patient states left thigh  . Craniectomy suboccipital for exploration / decompression cranial nerves  2003    5TH CRANIAL NERVE DECOMPRESSION  . Simple mastectomy  1988    MALE--- RIGHT BREAST W/ NODE DISSECTION  . Melanoma excision  10 YRS AGO    BACK AREA  . Extracorporeal shock wave lithotripsy  03-01-11    RIGHT  . Ureteroscopy  03/26/2011    Procedure: URETEROSCOPY;  Surgeon: Fredricka Bonine, MD;  Location: [facility];  Service: Urology;  Laterality: Right;  RIGHT URETEROSCOPY, HOLMIUM LASER AND STENT   . Examination under anesthesia  03/26/2011    Procedure: EXAM UNDER ANESTHESIA;  Surgeon: Fredricka Bonine, MD;  Location: [facility];  Service: Urology;  Laterality: N/A;  . Lymph node biopsy  04/21/2011    Procedure: LYMPH NODE BIOPSY;  Surgeon: Beckie Salts, MD;  Location: [place];  Service: ENT;  Laterality:  Right;  . Gastrostomy tube placement    . Esophagoscopy with dilitation N/A 02/14/2013    Procedure: ESOPHAGOSCOPY WITH DILITATION;  Surgeon: Izora Gala, MD;  Location: [place];  Service: ENT;  Laterality: N/A;  Attempted  . Direct laryngoscopy  02/14/2013    Procedure: DIRECT LARYNGOSCOPY;  Surgeon: Izora Gala, MD;  Location: [facility];  Service: ENT;;  . Esophageal dilation  05/01/13    tread method esophageal dilation - Dr. Fredricka Bonine  . Diagnostic laproscopy  05/03/13    Dr. Margarette Canada at [place]    Family History  Problem Relation Age of Onset  . Heart disease Mother   . Heart disease Father   . Cancer Sister     "perineum"  . Stomach cancer Paternal Grandmother     Allergies  Allergen Reactions  . Hydromorphone Swelling and Other (See Comments)    phlebitis  . Morphine And Related Other (See Comments)    Phlebitis, makes pt out of it   . Codeine Other (See Comments)    Dizzy, mental status changes    Current Outpatient Prescriptions on File Prior to Visit  Medication Sig Dispense Refill  . carbamazepine (TEGRETOL) 200 MG tablet Take 1 tablet (200 mg total) by mouth 3 (three) times daily. 90 tablet 1  . escitalopram (LEXAPRO) 5 MG tablet Take 1 tablet (5 mg total) by mouth daily. 90 tablet 3  . Glycerin, Adult, 2.1 G SUPP Place one suppository per rectum daily as needed until BM 25 suppository 1  . Lactulose SOLN 15 mLs by Does not apply route 2 (two) times daily. 1000 mL 1  . metoCLOPramide (REGLAN) 5 MG/5ML solution Take 5 mLs (5 mg total) by mouth 3 (three) times daily before meals. 240 mL 0  . Nutritional Supplements (FEEDING SUPPLEMENT, JEVITY 1.5 CAL/FIBER,) LIQD 1.5 cans Jevity 1.5 QID with 130 ml free water before and after each bolus feeding as tolerated via feeding tube. 1422 mL   . omeprazole (PRILOSEC) 40 MG capsule Take 1 capsule (40 mg total) by mouth daily as needed. 90 capsule 3  . ondansetron (ZOFRAN) 4 MG/5ML solution Take 10  mLs (8 mg total) by mouth every 8 (eight) hours as needed for nausea or vomiting. 50 mL 6  . polyethylene glycol (MIRALAX / GLYCOLAX) packet Instill 1 pack daily via tube 30 each 1  . traMADol (ULTRAM) 50 MG tablet Take 1 tablet (50 mg total) by mouth every 8 (eight) hours as needed. Jones  tablet 2  . Travoprost, BAK Free, (TRAVATAMN) 0.004 % SOLN ophthalmic solution Place 1 drop into both eyes at bedtime.    Marland Kitchen UNABLE TO FIND Med Name:  Z-Quil liquid by mouth at bedtime to help sleep.     Current Facility-Administered Medications on File Prior to Visit  Medication Dose Route Frequency Provider Last Rate Last Dose  . topical emolient (BIAFINE) emulsion   Topical PRN Kyung Rudd, MD        BP 148/80 mmHg  Pulse 72  Temp(Src) 99 F (37.2 C) (Oral)  Resp 20  Ht 5\' 10"  (1.778 m)  Wt 171 lb (77.565 kg)  BMI 24.54 kg/m2  SpO2 98%    Review of Systems  Psychiatric/Behavioral: Positive for behavioral problems and dysphoric mood.       Objective:   Physical Exam  Constitutional: He is oriented to person, place, and time. He appears well-developed.  HENT:  Head: Normocephalic.  Right Ear: External ear normal.  Left Ear: External ear normal.  Eyes: Conjunctivae and EOM are normal.  Neck: Normal range of motion.  Cardiovascular: Normal rate and normal heart sounds.   Pulmonary/Chest: Breath sounds normal.  Abdominal: Bowel sounds are normal.  Status post PEG tube  Musculoskeletal: Normal range of motion. He exhibits no edema or tenderness.  Neurological: He is alert and oriented to person, place, and time.  Psychiatric: He has a normal mood and affect. His behavior is normal. Judgment and thought content normal.          Assessment & Plan:   Clinical depression.  Much improved.  Will continue present regimen.  Recheck 4 months Essential hypertension, stable

## 2014-11-07 ENCOUNTER — Ambulatory Visit
Admission: RE | Admit: 2014-11-07 | Discharge: 2014-11-07 | Disposition: A | Discharge status: Home / Self Care | Payer: Medicare Other | Source: Ambulatory Visit | Attending: Radiation Oncology | Admitting: Radiation Oncology

## 2014-11-07 ENCOUNTER — Encounter: Payer: Self-pay | Admitting: Radiation Oncology

## 2014-11-07 VITALS — BP 181/85 | HR 75 | Temp 99.0°F | Resp 20 | Ht 70.0 in | Wt 169.8 lb

## 2014-11-07 DIAGNOSIS — C01 Malignant neoplasm of base of tongue: Secondary | ICD-10-CM | POA: Diagnosis not present

## 2014-11-07 DIAGNOSIS — C109 Malignant neoplasm of oropharynx, unspecified: Secondary | ICD-10-CM | POA: Insufficient documentation

## 2014-11-07 MED ORDER — LARYNGOSCOPY SOLUTION RAD-ONC
15.0000 mL | Freq: Once | TOPICAL | Status: DC
  Administered 2014-11-08: 15 mL via TOPICAL

## 2014-11-07 NOTE — Progress Notes (Signed)
Follow up  S/p rad txs to tongue 2013,  Thick saliva,, coughing up white sputum,  patient using walker and stated his abomen started burning, in pain,  Stopped for a minute and assisted patient to room 10, placed yellow fall risk k bracelet on right wrist,brought w/c outside the door, patient very weak, using jevity 1.5cal cann gets 5 cans with 60 ml ensure via peg tube daily and 130 ml free water via peg before and after feedings, no c/o pain, no nausea at present,  Gags and has dry heaves every once in a whileNext appt with Dr. Alvy Bimler 02/06/15, no appt with Dr. Constance Holster stated patient 4:28 PM BP 181/85 mmHg  Pulse 75  Temp(Src) 99 F (37.2 C) (Oral)  Resp 20  Ht 5\' 10"  (1.778 m)  Wt 169 lb 12.8 oz (77.021 kg)  BMI 24.36 kg/m2  SpO2 99%  Wt Readings from Last 3 Encounters:  11/07/14 169 lb 12.8 oz (77.021 kg)  09/24/14 171 lb (77.565 kg)  08/06/14 171 lb 11.2 oz (77.883 kg)

## 2014-11-08 ENCOUNTER — Encounter: Payer: Self-pay | Admitting: Radiation Oncology

## 2014-11-08 NOTE — Progress Notes (Addendum)
Radiation Oncology         [phone] ________________________________  Name: Jose [name] MRN: [id number]  Date: 11/07/2014  DOB: 1927/06/07  Follow-Up Visit Note  CC: Nyoka Cowden, MD  Marletta Lor, MD  Diagnosis:    Base of tongue squamous cell carcinoma.  Interval Since Last Radiation:  The patient completed radiation treatment on 06/21/2011   Narrative:   Follow up  S/p rad txs to tongue 2013,  Thick saliva,, coughing up white sputum,  patient using walker and stated his abomen started burning, in pain,  Stopped for a minute and assisted patient to room 10, placed yellow fall risk k bracelet on right wrist,brought w/c outside the door, patient very weak, using jevity 1.5cal cann gets 5 cans with 60 ml ensure via peg tube daily and 130 ml free water via peg before and after feedings, no c/o pain, no nausea at present,  Gags and has dry heaves every once in a whileNext appt with Dr. Alvy Bimler 02/06/15, no appt with Dr. Constance Holster stated patient 9:30 AM BP 181/85 mmHg  Pulse 75  Temp(Src) 99 F (37.2 C) (Oral)  Resp 20  Ht 5\' 10"  (1.778 m)  Wt 169 lb 12.8 oz (77.021 kg)  BMI 24.36 kg/m2  SpO2 99%  Wt Readings from Last 3 Encounters:  11/07/14 169 lb 12.8 oz (77.021 kg)  09/24/14 171 lb (77.565 kg)  08/06/14 171 lb 11.2 oz (77.883 kg)                      ALLERGIES:  is allergic to hydromorphone; morphine and related; and codeine.  Meds: Current Outpatient Prescriptions  Medication Sig Dispense Refill  . carbamazepine (TEGRETOL) 200 MG tablet Take 1 tablet (200 mg total) by mouth 3 (three) times daily. 90 tablet 1  . escitalopram (LEXAPRO) 5 MG tablet Take 1 tablet (5 mg total) by mouth daily. 90 tablet 3  . Nutritional Supplements (FEEDING SUPPLEMENT, JEVITY 1.5 CAL/FIBER,) LIQD 1.5 cans Jevity 1.5 QID with 130 ml free water before and after each bolus feeding as tolerated via feeding tube. 1422 mL   . omeprazole (PRILOSEC) 40 MG capsule Take 1 capsule (40  mg total) by mouth daily as needed. 90 capsule 3  . ondansetron (ZOFRAN) 4 MG/5ML solution Take 10 mLs (8 mg total) by mouth every 8 (eight) hours as needed for nausea or vomiting. 50 mL 6  . polyethylene glycol (MIRALAX / GLYCOLAX) packet Instill 1 pack daily via tube 30 each 1  . traMADol (ULTRAM) 50 MG tablet Take 1 tablet (50 mg total) by mouth every 8 (eight) hours as needed. 90 tablet 2  . Travoprost, BAK Free, (TRAVATAMN) 0.004 % SOLN ophthalmic solution Place 1 drop into both eyes at bedtime.    Marland Kitchen UNABLE TO FIND Med Name:  Z-Quil liquid by mouth at bedtime to help sleep.    . Glycerin, Adult, 2.1 G SUPP Place one suppository per rectum daily as needed until BM (Patient not taking: Reported on 11/07/2014) 25 suppository 1  . Lactulose SOLN 15 mLs by Does not apply route 2 (two) times daily. (Patient not taking: Reported on 11/07/2014) 1000 mL 1  . metoCLOPramide (REGLAN) 5 MG/5ML solution Take 5 mLs (5 mg total) by mouth 3 (three) times daily before meals. (Patient not taking: Reported on 11/07/2014) 240 mL 0   No current facility-administered medications for this encounter.   Facility-Administered Medications Ordered in Other Encounters  Medication Dose Route Frequency Provider Last  Rate Last Dose  . topical emolient (BIAFINE) emulsion   Topical PRN Kyung Rudd, MD        Physical Findings: The patient is in no acute distress. Patient is alert and oriented.  height is 5\' 10"  (1.778 m) and weight is 169 lb 12.8 oz (77.021 kg). His oral temperature is 99 F (37.2 C). His blood pressure is 181/85 and his pulse is 75. His respiration is 20 and oxygen saturation is 99%. .   Alert, in no acute distress No suspicious lesions within the oral cavity.  No cervical lymphadenopathy present. Some fibrosis is present within the neck bilaterally secondary to radiation effect.  Fiberoptic exam: After the use of topical anesthetic, the flexible laryngoscope was passed through the right near. Good  visualization was obtained. No lesions or suspicious findings within the larynx, hypopharynx, oropharynx or nasopharynx. Fibrosis/shrinkage of the epiglottis is seen again. Some pooling of secretions in the vallecula is present.   Lab Findings: Lab Results  Component Value Date   WBC 4.5 12/27/2013   HGB 13.5 12/27/2013   HCT 40.1 12/27/2013   MCV 94.4 12/27/2013   PLT 234 12/27/2013     Radiographic Findings: No results found.  Impression:    The patient remains clinically NED at this time for his head and neck cancer. He continues to be feeding tube dependent. The patient also continues to be followed by urology for a hypermetabolic region within the prostate which was seen on PET scan. The patient again indicated that he does not wish to proceed with any surgery which was discussed at [facility] given the patient's continued difficulty swallowing.    Plan:  The patient will return to our clinic in 6 months for ongoing follow-up.  The patient was seen today for 15 minutes, with the majority of the time spent counseling the patient on his diagnosis of cancer and coordinating his care.    Jodelle Gross, M.D., Ph.D.

## 2014-11-08 NOTE — Addendum Note (Signed)
Encounter addended by: Norm Salt, RN on: 11/08/2014  4:53 PM<BR>     Documentation filed: [employer], Orders

## 2014-11-12 ENCOUNTER — Other Ambulatory Visit (HOSPITAL_COMMUNITY): Payer: Self-pay | Admitting: Interventional Radiology

## 2014-11-12 ENCOUNTER — Other Ambulatory Visit (HOSPITAL_COMMUNITY): Payer: Self-pay | Admitting: Radiology

## 2014-11-12 ENCOUNTER — Ambulatory Visit (HOSPITAL_COMMUNITY)
Admission: RE | Admit: 2014-11-12 | Discharge: 2014-11-12 | Disposition: A | Discharge status: Home / Self Care | Payer: Medicare Other | Source: Ambulatory Visit | Attending: Radiology | Admitting: Radiology

## 2014-11-12 DIAGNOSIS — Z931 Gastrostomy status: Secondary | ICD-10-CM

## 2014-11-12 NOTE — Procedures (Signed)
Patient arrived in Radiology lobby to have his gastrostomy repaired.  The tip of the cap had broken off into the opening of the balloon gastrostomy.  After removing the obstructive material, a new bolus feeding adapter from a PEG was placed into the opening.  The patient and his wife were pleased with this result and were instructed to call IR with any further issues.

## 2014-12-03 ENCOUNTER — Other Ambulatory Visit: Payer: Self-pay | Admitting: Family Medicine

## 2014-12-06 MED ORDER — TRAMADOL HCL 50 MG PO TABS: 50.0000 mg | ORAL_TABLET | Freq: Three times a day (TID) | ORAL | Status: DC | PRN

## 2015-01-09 ENCOUNTER — Encounter (HOSPITAL_COMMUNITY): Payer: Self-pay | Admitting: Emergency Medicine

## 2015-01-09 ENCOUNTER — Emergency Department (HOSPITAL_COMMUNITY): Payer: Medicare Other

## 2015-01-09 ENCOUNTER — Emergency Department (HOSPITAL_COMMUNITY)
Admission: EM | Admit: 2015-01-09 | Discharge: 2015-01-09 | Disposition: A | Discharge status: Home / Self Care | Payer: Medicare Other | Attending: Emergency Medicine | Admitting: Emergency Medicine

## 2015-01-09 DIAGNOSIS — Z87891 Personal history of nicotine dependence: Secondary | ICD-10-CM | POA: Diagnosis not present

## 2015-01-09 DIAGNOSIS — Z853 Personal history of malignant neoplasm of breast: Secondary | ICD-10-CM | POA: Diagnosis not present

## 2015-01-09 DIAGNOSIS — Z87442 Personal history of urinary calculi: Secondary | ICD-10-CM | POA: Insufficient documentation

## 2015-01-09 DIAGNOSIS — Z4659 Encounter for fitting and adjustment of other gastrointestinal appliance and device: Secondary | ICD-10-CM

## 2015-01-09 DIAGNOSIS — H9190 Unspecified hearing loss, unspecified ear: Secondary | ICD-10-CM | POA: Diagnosis not present

## 2015-01-09 DIAGNOSIS — K9423 Gastrostomy malfunction: Secondary | ICD-10-CM | POA: Diagnosis not present

## 2015-01-09 DIAGNOSIS — I129 Hypertensive chronic kidney disease with stage 1 through stage 4 chronic kidney disease, or unspecified chronic kidney disease: Secondary | ICD-10-CM | POA: Insufficient documentation

## 2015-01-09 DIAGNOSIS — Z85810 Personal history of malignant neoplasm of tongue: Secondary | ICD-10-CM | POA: Insufficient documentation

## 2015-01-09 DIAGNOSIS — N189 Chronic kidney disease, unspecified: Secondary | ICD-10-CM | POA: Insufficient documentation

## 2015-01-09 DIAGNOSIS — F419 Anxiety disorder, unspecified: Secondary | ICD-10-CM | POA: Diagnosis not present

## 2015-01-09 DIAGNOSIS — Z8739 Personal history of other diseases of the musculoskeletal system and connective tissue: Secondary | ICD-10-CM | POA: Diagnosis not present

## 2015-01-09 DIAGNOSIS — Z86010 Personal history of colonic polyps: Secondary | ICD-10-CM | POA: Insufficient documentation

## 2015-01-09 DIAGNOSIS — Z85828 Personal history of other malignant neoplasm of skin: Secondary | ICD-10-CM | POA: Insufficient documentation

## 2015-01-09 DIAGNOSIS — K219 Gastro-esophageal reflux disease without esophagitis: Secondary | ICD-10-CM | POA: Diagnosis not present

## 2015-01-09 DIAGNOSIS — H409 Unspecified glaucoma: Secondary | ICD-10-CM | POA: Diagnosis not present

## 2015-01-09 DIAGNOSIS — Z862 Personal history of diseases of the blood and blood-forming organs and certain disorders involving the immune mechanism: Secondary | ICD-10-CM | POA: Insufficient documentation

## 2015-01-09 DIAGNOSIS — Z79899 Other long term (current) drug therapy: Secondary | ICD-10-CM | POA: Insufficient documentation

## 2015-01-09 MED ORDER — LIDOCAINE HCL 2 % IJ SOLN
INTRAMUSCULAR | Status: AC
  Administered 2015-01-09: 400 mg
  Filled 2015-01-09: qty 20

## 2015-01-09 MED ORDER — LIDOCAINE HCL 2 % EX GEL: 1.0000 "application " | Freq: Once | CUTANEOUS | Status: DC

## 2015-01-09 MED ORDER — LIDOCAINE HCL 2 % EX GEL
CUTANEOUS | Status: AC
  Filled 2015-01-09: qty 5

## 2015-01-09 NOTE — ED Provider Notes (Signed)
CSN: [id number]     Arrival date & time 01/09/15  1109 History   First MD Initiated Contact with Patient 01/09/15 1113     No chief complaint on file.    (Consider location/radiation/quality/duration/timing/severity/associated sxs/prior Treatment) HPI Comments: Patient presents to the ER because his G-tube came out. Patient reports that it fell out this morning at 2AM. When I cannot he noticed that the balloon was ruptured. Patient denies any trauma.   Past Medical History  Diagnosis Date  . Blood transfusion     2006 had 4 units  . Arthritis     knees,back,shoulders  . Anxiety   . Right ureteral stone S/P URETERAL STENT 01-24-11  AND ESWL  03-01-11  . History of acute renal failure 01-23-11    DUE TO HYDRONEPHROSIS AND RIGHT STONE  . Normal cardiac stress test 09-25-2007  . Glaucoma   . Cataract immature   . Diverticulosis of colon   . History of GI diverticular bleed   . Hyperlipemia   . Impaired hearing NOT WEARING HIS BILATERL AIDS  . Urgency of urination     DUE TO URETERAL STONE AND STENT  . Frequency of urination   . Insomnia   . Anemia   . Chronic kidney disease     kidney stones  . GERD (gastroesophageal reflux disease)   . Hernia     inguinal  . Headache(784.0)     migraines  . Trigeminal neuralgia   . Hypertension   . History of radiation therapy 06/21/11-08/04/11    tongue/h/n 69.96 gy in 33 fxs  . History of chemotherapy 07/26/11    taxol/carbo completed/stopped 07/26/11  . 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 G tube feedings   . History of radiation therapy 06/21/11-08/04/11    base of tongue  . Colon polyps   . Diverticulosis   . Male breast cancer 1988    S/P RIGHT MASTECTOMY W/ NODE DISSECTION AND CHEMO  . 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 Skin cancer 2002    melanoma- back  . Nausea without vomiting 12/27/2013  . Melanoma of skin 12/27/2013  . Prostate cancer screening 02/05/2014  . Elevated PSA 02/06/2014   Past Surgical History   Procedure Laterality Date  . Cystoscopy w/ ureteral stent placement  01/24/2011    Procedure: CYSTOSCOPY WITH RETROGRADE PYELOGRAM/URETERAL STENT PLACEMENT;  Surgeon: Fredricka Bonine, MD;  Location: [facility];  Service: Urology;  Laterality: Right;  . Left leg surg for fx  AGE 89    patient states left thigh  . Craniectomy suboccipital for exploration / decompression cranial nerves  2003    5TH CRANIAL NERVE DECOMPRESSION  . Simple mastectomy  1988    MALE--- RIGHT BREAST W/ NODE DISSECTION  . Melanoma excision  10 YRS AGO    BACK AREA  . Extracorporeal shock wave lithotripsy  03-01-11    RIGHT  . Ureteroscopy  03/26/2011    Procedure: URETEROSCOPY;  Surgeon: Fredricka Bonine, MD;  Location: [facility];  Service: Urology;  Laterality: Right;  RIGHT URETEROSCOPY, HOLMIUM LASER AND STENT   . Examination under anesthesia  03/26/2011    Procedure: EXAM UNDER ANESTHESIA;  Surgeon: Fredricka Bonine, MD;  Location: [facility];  Service: Urology;  Laterality: N/A;  . Lymph node biopsy  04/21/2011    Procedure: LYMPH NODE BIOPSY;  Surgeon: Beckie Salts, MD;  Location: [place];  Service: ENT;  Laterality: Right;  .  Gastrostomy tube placement    . Esophagoscopy with dilitation N/A 02/14/2013    Procedure: ESOPHAGOSCOPY WITH DILITATION;  Surgeon: Izora Gala, MD;  Location: [place];  Service: ENT;  Laterality: N/A;  Attempted  . Direct laryngoscopy  02/14/2013    Procedure: DIRECT LARYNGOSCOPY;  Surgeon: Izora Gala, MD;  Location: [facility];  Service: ENT;;  . Esophageal dilation  05/01/13    tread method esophageal dilation - Dr. Fredricka Bonine  . Diagnostic laproscopy  05/03/13    Dr. Margarette Canada at [place]   Family History  Problem Relation Age of Onset  . Heart disease Mother   . Heart disease Father   . Cancer Sister     "perineum"  . Stomach cancer Paternal Grandmother    Social History  Substance Use Topics  .  Smoking status: Former Smoker -- 0.25 packs/day for 30 years    Types: Cigarettes, Cigars    Quit date: 12/20/1980  . Smokeless tobacco: Never Used  . Alcohol Use: No    Review of Systems  Gastrointestinal: Negative for abdominal pain.  All other systems reviewed and are negative.     Allergies  Hydromorphone; Morphine and related; and Codeine  Home Medications   Prior to Admission medications   Medication Sig Start Date End Date Taking? Authorizing Provider  carbamazepine (TEGRETOL) 200 MG tablet Take 1 tablet (200 mg total) by mouth 3 (three) times daily. 05/07/14   Marletta Lor, MD  escitalopram (LEXAPRO) 5 MG tablet Take 1 tablet (5 mg total) by mouth daily. 07/24/14   Marletta Lor, MD  Glycerin, Adult, 2.1 G SUPP Place one suppository per rectum daily as needed until [facility] Patient not taking: Reported on 11/07/2014 07/01/14   Heath Lark, MD  Lactulose SOLN 15 mLs by Does not apply route 2 (two) times daily. Patient not taking: Reported on 11/07/2014 07/01/14   Heath Lark, MD  metoCLOPramide (REGLAN) 5 MG/5ML solution Take 5 mLs (5 mg total) by mouth 3 (three) times daily before meals. Patient not taking: Reported on 11/07/2014 08/06/14   Heath Lark, MD  Nutritional Supplements (FEEDING SUPPLEMENT, JEVITY 1.5 CAL/FIBER,) LIQD 1.5 cans Jevity 1.5 QID with 130 ml free water before and after each bolus feeding as tolerated via feeding tube. 09/12/13   Heath Lark, MD  omeprazole (PRILOSEC) 40 MG capsule Take 1 capsule (40 mg total) by mouth daily as needed. 12/03/13   Marletta Lor, MD  ondansetron [facility]) 4 MG/5ML solution Take 10 mLs (8 mg total) by mouth every 8 (eight) hours as needed for nausea or vomiting. 12/27/13   Heath Lark, MD  polyethylene glycol (MIRALAX / GLYCOLAX) packet Instill 1 pack daily via tube 07/01/14   Heath Lark, MD  traMADol (ULTRAM) 50 MG tablet Take 1 tablet (50 mg total) by mouth every 8 (eight) hours as needed. 12/06/14   Marletta Lor, MD   Travoprost, BAK Free, (TRAVATAMN) 0.004 % SOLN ophthalmic solution Place 1 drop into both eyes at bedtime. 01/27/11   Monika Salk, MD  UNABLE TO FIND Med Name:  Z-Quil liquid by mouth at bedtime to help sleep.    Historical Provider, MD   BP 169/88 mmHg  Pulse 63  Temp(Src) 97.4 F (36.3 C) (Oral)  Resp 16  SpO2 98% Physical Exam  Constitutional: He is oriented to person, place, and time. He appears well-developed and well-nourished. No distress.  HENT:  Head: Normocephalic and atraumatic.  Right Ear: Hearing normal.  Left  Ear: Hearing normal.  Nose: Nose normal.  Mouth/Throat: Oropharynx is clear and moist and mucous membranes are normal.  Eyes: Conjunctivae and EOM are normal. Pupils are equal, round, and reactive to light.  Neck: Normal range of motion. Neck supple.  Cardiovascular: Regular rhythm, S1 normal and S2 normal.  Exam reveals no gallop and no friction rub.   No murmur heard. Pulmonary/Chest: Effort normal and breath sounds normal. No respiratory distress. He exhibits no tenderness.  Abdominal: Soft. Normal appearance and bowel sounds are normal. There is no hepatosplenomegaly. There is no tenderness. There is no rebound, no guarding, no tenderness at McBurney's point and negative Murphy's sign. No hernia.  Musculoskeletal: Normal range of motion.  Neurological: He is alert and oriented to person, place, and time. He has normal strength. No cranial nerve deficit or sensory deficit. Coordination normal. GCS eye subscore is 4. GCS verbal subscore is 5. GCS motor subscore is 6.  Skin: Skin is warm, dry and intact. No rash noted. No cyanosis.  Psychiatric: He has a normal mood and affect. His speech is normal and behavior is normal. Thought content normal.  Nursing note and vitals reviewed.   ED Course  Procedures (including critical care time)  Feeding tube placement: Stoma has partially closed, could not immediately replace 20 [place] feeding tube because of pain. Area  was therefore infiltrated with 2% lidocaine and then the tube was passed with gentle pressure. X-ray checked for placement.  Labs Review Labs Reviewed - No data to display  Imaging Review No results found. I have personally reviewed and evaluated these images and lab results as part of my medical decision-making.   EKG Interpretation None      MDM   Final diagnoses:  None   G-tube replacement  Patient presented for accidental loss of G-tube. I was able to replace the tube with an identical G-tube and placement checked with x-ray.    Orpah Greek, MD 01/09/15 1140

## 2015-01-09 NOTE — Discharge Instructions (Signed)
Gastrostomy Tube Home Guide, Adult °A gastrostomy tube is a tube that is surgically placed into the stomach. It is also called a "G-tube." G-tubes are used when a person is unable to eat and drink enough on their own to stay healthy. The tube is inserted into the stomach through a small cut (incision) in the skin. This tube is used for: °· Feeding. °· Giving medication. °GASTROSTOMY TUBE CARE °· Wash your hands with soap and water. °· Remove the old dressing (if any). Some styles of G-tubes may need a dressing inserted between the skin and the G-tube. Other types of G-tubes do not require a dressing. Ask your health care provider if a dressing is needed. °· Check the area where the tube enters the skin (insertion site) for redness, swelling, or pus-like (purulent) drainage. A small amount of clear or tan liquid drainage is normal. Check to make sure scar tissue (skin) is not growing around the insertion site. This could have a raised, bumpy appearance. °· A cotton swab can be used to clean the skin around the tube: °¨ When the G-tube is first put in, a normal saline solution or water can be used to clean the skin. °¨ Mild soap and warm water can be used when the skin around the G-tube site has healed. °¨ Roll the cotton swab around the G-tube insertion site to remove any drainage or crusting at the insertion site. °STOMACH RESIDUALS °Feeding tube residuals are the amount of liquids that are in the stomach at any given time. Residuals may be checked before giving feedings, medications, or as instructed by your health care provider. °· Ask your health care provider if there are instances when you would not start tube feedings depending on the amount or type of contents withdrawn from the stomach. °· Check residuals by attaching a syringe to the G-tube and pulling back on the syringe plunger. Note the amount, and return the residual back into the stomach. °FLUSHING THE G-TUBE °· The G-tube should be periodically  flushed with clean warm water to keep it from clogging. °¨ Flush the G-tube after feedings or medications. Draw up 30 mL of warm water in a syringe. Connect the syringe to the G-tube and slowly push the water into the tube. °¨ Do not push feedings, medications, or flushes rapidly. Flush the G-tube gently and slowly. °¨ Only use syringes made for G-tubes to flush medications or feedings. °¨ Your health care provider may want the G-tube flushed more often or with more water. If this is the case, follow your health care provider's instructions. °FEEDINGS °Your health care provider will determine whether feedings are given as a bolus (a certain amount given at one time and at scheduled times) or whether feedings will be given continuously on a feeding pump.  °· Formulas should be given at room temperature. °· If feedings are continuous, no more than 4 hours worth of feedings should be placed in the feeding bag. This helps prevent spoilage or accidental excess infusion. °· Cover and place unused formula in the refrigerator. °· If feedings are continuous, stop the feedings when medications or flushes are given. Be sure to restart the feedings. °· Feeding bags and syringes should be replaced as instructed by your health care provider. °GIVING MEDICATION  °· In general, it is best if all medications are in a liquid form for G-tube administration. Liquid medications are less likely to clog the G-tube. °¨ Mix the liquid medication with 30 mL (or amount recommended by   your health care provider) of warm water. °¨ Draw up the medication into the syringe. °¨ Attach the syringe to the G-tube and slowly push the mixture into the G-tube. °¨ After giving the medication, draw up 30 mL of warm water in the syringe and slowly flush the G-tube. °· For pills or capsules, check with your health care provider first before crushing medications. Some pills are not effective if they are crushed. Some capsules are sustained-release  medications. °¨ If appropriate, crush the pill or capsule and mix with 30 mL of warm water. Using the syringe, slowly push the medication through the tube, then flush the tube with another 30 mL of tap water. °G-TUBE PROBLEMS °G-tube was pulled out. °· Cause: May have been pulled out accidentally. °· Solutions: Cover the opening with clean dressing and tape. Call your health care provider right away. The G-tube should be put in as soon as possible (within 4 hours) so the G-tube opening (tract) does not close. The G-tube needs to be put in at a health care setting. An X-ray needs to be done to confirm placement before the G-tube can be used again. °Redness, irritation, soreness, or foul odor around the gastrostomy site. °· Cause: May be caused by leakage or infection. °· Solutions: Call your health care provider right away. °Large amount of leakage of fluid or mucus-like liquid present (a large amount means it soaks clothing). °· Cause: Many reasons could cause the G-tube to leak. °· Solutions: Call your health care provider to discuss the amount of leakage. °Skin or scar tissue appears to be growing where tube enters skin.  °· Cause: Tissue growth may develop around the insertion site if the G-tube is moved or pulled on excessively. °· Solutions: Secure tube with tape so that excess movement does not occur. Call your health care provider. °G-tube is clogged. °· Cause: Thick formula or medication. °· Solutions: Try to slowly push warm water into the tube with a large syringe. Never try to push any object into the tube to unclog it. Do not force fluid into the G-tube. If you are unable to unclog the tube, call your health care provider right away. °TIPS °· Head of bed (HOB) position refers to the upright position of a person's upper body. °¨ When giving medications or a feeding bolus, keep the HOB up as told by your health care provider. Do this during the feeding and for 1 hour after the feeding or medication  administration. °¨ If continuous feedings are being given, it is best to keep the HOB up as told by your health care provider. When ADLs (activities of daily living) are performed and the HOB needs to be flat, be sure to turn the feeding pump off. Restart the feeding pump when the HOB is returned to the recommended height. °· Do not pull or put tension on the tube. °· To prevent fluid backflow, kink the G-tube before removing the cap or disconnecting a syringe. °· Check the G-tube length every day. Measure from the insertion site to the end of the G-tube. If the length is longer than previous measurements, the tube may be coming out. Call your health care provider if you notice increasing G-tube length. °· Oral care, such as brushing teeth, must be continued. °· You may need to remove excess air (vent) from the G-tube. Your health care provider will tell you if this is needed. °· Always call your health care provider if you have questions or problems with the   G-tube. SEEK IMMEDIATE MEDICAL CARE IF:   You have severe abdominal pain, tenderness, or abdominal bloating (distension).  You have nausea or vomiting.  You are constipated or have problems moving your bowels.  The G-tube insertion site is red, swollen, has a foul smell, or has yellow or brown drainage.  You have difficulty breathing or shortness of breath.  You have a fever.  You have a large amount of feeding tube residuals.  The G-tube is clogged and cannot be flushed. MAKE SURE YOU:   Understand these instructions.  Will watch your condition.  Will get help right away if you are not doing well or get worse.   This information is not intended to replace advice given to you by your health care provider. Make sure you discuss any questions you have with your health care provider.   Document Released: 05/17/2001 Document Revised: 07/23/2014 Document Reviewed: 11/13/2012 Elsevier Interactive Patient Education [employer].

## 2015-01-09 NOTE — ED Notes (Signed)
Pt from home prsents to ED with feeding tube completely displaced. Per pt the tube was displaced at 2 am this morning. No bleeding or oozing noticed upon assessment.

## 2015-01-21 ENCOUNTER — Encounter: Payer: Self-pay | Admitting: Internal Medicine

## 2015-01-21 ENCOUNTER — Ambulatory Visit (INDEPENDENT_AMBULATORY_CARE_PROVIDER_SITE_OTHER): Payer: Medicare Other | Admitting: Internal Medicine

## 2015-01-21 VITALS — BP 128/70 | HR 63 | Temp 98.0°F | Resp 18 | Ht 70.0 in | Wt 170.0 lb

## 2015-01-21 DIAGNOSIS — Z23 Encounter for immunization: Secondary | ICD-10-CM | POA: Diagnosis not present

## 2015-01-21 DIAGNOSIS — C109 Malignant neoplasm of oropharynx, unspecified: Secondary | ICD-10-CM | POA: Diagnosis not present

## 2015-01-21 DIAGNOSIS — I10 Essential (primary) hypertension: Secondary | ICD-10-CM

## 2015-01-21 DIAGNOSIS — Z931 Gastrostomy status: Secondary | ICD-10-CM

## 2015-01-21 DIAGNOSIS — E785 Hyperlipidemia, unspecified: Secondary | ICD-10-CM

## 2015-01-21 LAB — CBC WITH DIFFERENTIAL/PLATELET
BASOS PCT: 0.8 % (ref 0.0–3.0)
Basophils Absolute: 0 10*3/uL (ref 0.0–0.1)
EOS ABS: 0 10*3/uL (ref 0.0–0.7)
EOS PCT: 0.7 % (ref 0.0–5.0)
HEMATOCRIT: 41.7 % (ref 39.0–52.0)
Hemoglobin: 14 g/dL (ref 13.0–17.0)
LYMPHS PCT: 12.7 % (ref 12.0–46.0)
Lymphs Abs: 0.6 10*3/uL — ABNORMAL LOW (ref 0.7–4.0)
MCHC: 33.7 g/dL (ref 30.0–36.0)
MCV: 93.7 fl (ref 78.0–100.0)
MONOS PCT: 10.6 % (ref 3.0–12.0)
Monocytes Absolute: 0.5 10*3/uL (ref 0.1–1.0)
NEUTROS ABS: 3.6 10*3/uL (ref 1.4–7.7)
Neutrophils Relative %: 75.2 % (ref 43.0–77.0)
PLATELETS: 225 10*3/uL (ref 150.0–400.0)
RBC: 4.45 Mil/uL (ref 4.22–5.81)
RDW: 13.1 % (ref 11.5–15.5)
WBC: 4.7 10*3/uL (ref 4.0–10.5)

## 2015-01-21 LAB — COMPREHENSIVE METABOLIC PANEL
ALT: 17 U/L (ref 0–53)
AST: 17 U/L (ref 0–37)
Albumin: 3.8 g/dL (ref 3.5–5.2)
Alkaline Phosphatase: 104 U/L (ref 39–117)
BUN: 25 mg/dL — ABNORMAL HIGH (ref 6–23)
CALCIUM: 9.3 mg/dL (ref 8.4–10.5)
CHLORIDE: 103 meq/L (ref 96–112)
CO2: 27 meq/L (ref 19–32)
CREATININE: 1.28 mg/dL (ref 0.40–1.50)
GFR: 56.7 mL/min — ABNORMAL LOW (ref >60.00)
Glucose, Bld: 150 mg/dL — ABNORMAL HIGH (ref 70–99)
POTASSIUM: 4.3 meq/L (ref 3.5–5.1)
Sodium: 139 mEq/L (ref 135–145)
Total Bilirubin: 0.6 mg/dL (ref 0.2–1.2)
Total Protein: 6.6 g/dL (ref 6.0–8.3)

## 2015-01-21 LAB — TSH: TSH: 3.51 u[IU]/mL (ref 0.35–4.50)

## 2015-01-21 MED ORDER — ESCITALOPRAM OXALATE 5 MG PO TABS: 5.0000 mg | ORAL_TABLET | Freq: Every day | ORAL | Status: DC

## 2015-01-21 MED ORDER — ONDANSETRON HCL 4 MG/5ML PO SOLN: 8.0000 mg | Freq: Three times a day (TID) | ORAL | Status: DC | PRN

## 2015-01-21 MED ORDER — HYDROCODONE-ACETAMINOPHEN 10-325 MG PO TABS: 1.0000 | ORAL_TABLET | Freq: Three times a day (TID) | ORAL | Status: DC | PRN

## 2015-01-21 NOTE — Progress Notes (Signed)
Subjective:    Patient ID: Jose [name], male    DOB: 02-11-1928, 82 y.o.   MRN: [id number]  HPI  Wt Readings from Last 3 Encounters:  01/21/15 170 lb (77.111 kg)  11/07/14 169 lb 12.8 oz (77.021 kg)  09/24/14 171 lb (77.90 kg)    81 year old patient who is seen today in follow-up.  He has essential hypertension.  He has a history of cancer of the base of the time and also esophageal stenosis.  He has a permanent gastrostomy tube.  He is doing fairly well.  No recent lab He still has occasional hemicranial pain due to trigeminal neuralgia and is requesting a stronger analgesic to have on hand. His weight is been stable He also complains of some dizziness consistent with the vertigo.  Past Medical History  Diagnosis Date  . Blood transfusion     2006 had 4 units  . Arthritis     knees,back,shoulders  . Anxiety   . Right ureteral stone S/P URETERAL STENT 01-24-11  AND ESWL  03-01-11  . History of acute renal failure 01-23-11    DUE TO HYDRONEPHROSIS AND RIGHT STONE  . Normal cardiac stress test 09-25-2007  . Glaucoma   . Cataract immature   . Diverticulosis of colon   . History of GI diverticular bleed   . Hyperlipemia   . Impaired hearing NOT WEARING HIS BILATERL AIDS  . Urgency of urination     DUE TO URETERAL STONE AND STENT  . Frequency of urination   . Insomnia   . Anemia   . Chronic kidney disease     kidney stones  . GERD (gastroesophageal reflux disease)   . Hernia     inguinal  . Headache(784.0)     migraines  . Trigeminal neuralgia   . Hypertension   . History of radiation therapy 06/21/11-08/04/11    tongue/h/n 69.96 gy in 33 fxs  . History of chemotherapy 07/26/11    taxol/carbo completed/stopped 07/26/11  . 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 G tube feedings ([place])   . History of radiation therapy 06/21/11-08/04/11    base of tongue  . Colon polyps   . Diverticulosis   . Male breast cancer ([place]) 1988    S/P RIGHT MASTECTOMY W/ NODE DISSECTION AND CHEMO    . Cancer of base of tongue ([place]) 04/21/11    cT4 N2c M0   . Skin cancer 2002    melanoma- back  . Nausea without vomiting 12/27/2013  . Melanoma of skin ([place]) 12/27/2013  . Prostate cancer screening 02/05/2014  . Elevated PSA 02/06/2014    Social History   Social History  . Marital Status: Married    Spouse Name: N/A  . Number of Children: 2  . Years of Education: N/A   Occupational History  . retired    Social History Main Topics  . Smoking status: Former Smoker -- 0.25 packs/day for 30 years    Types: Cigarettes, Cigars    Quit date: 12/20/1980  . Smokeless tobacco: Never Used  . Alcohol Use: No  . Drug Use: No  . Sexual Activity: Yes   Other Topics Concern  . Not on file   Social History Narrative    Past Surgical History  Procedure Laterality Date  . Cystoscopy w/ ureteral stent placement  01/24/2011    Procedure: CYSTOSCOPY WITH RETROGRADE PYELOGRAM/URETERAL STENT PLACEMENT;  Surgeon: Fredricka Bonine, MD;  Location: [facility];  Service: Urology;  Laterality: Right;  . Left  leg surg for fx  AGE 54    patient states left thigh  . Craniectomy suboccipital for exploration / decompression cranial nerves  2003    5TH CRANIAL NERVE DECOMPRESSION  . Simple mastectomy  1988    MALE--- RIGHT BREAST W/ NODE DISSECTION  . Melanoma excision  10 YRS AGO    BACK AREA  . Extracorporeal shock wave lithotripsy  03-01-11    RIGHT  . Ureteroscopy  03/26/2011    Procedure: URETEROSCOPY;  Surgeon: Fredricka Bonine, MD;  Location: [facility];  Service: Urology;  Laterality: Right;  RIGHT URETEROSCOPY, HOLMIUM LASER AND STENT   . Examination under anesthesia  03/26/2011    Procedure: EXAM UNDER ANESTHESIA;  Surgeon: Fredricka Bonine, MD;  Location: [facility];  Service: Urology;  Laterality: N/A;  . Lymph node biopsy  04/21/2011    Procedure: LYMPH NODE BIOPSY;  Surgeon: Beckie Salts, MD;  Location: [facility];  Service: ENT;  Laterality:  Right;  . Gastrostomy tube placement    . Esophagoscopy with dilitation N/A 02/14/2013    Procedure: ESOPHAGOSCOPY WITH DILITATION;  Surgeon: Izora Gala, MD;  Location: [facility];  Service: ENT;  Laterality: N/A;  Attempted  . Direct laryngoscopy  02/14/2013    Procedure: DIRECT LARYNGOSCOPY;  Surgeon: Izora Gala, MD;  Location: [facility] OR;  Service: ENT;;  . Esophageal dilation  05/01/13    tread method esophageal dilation - Dr. Fredricka Bonine  . Diagnostic laproscopy  05/03/13    Dr. Margarette Canada at [facility]    Family History  Problem Relation Age of Onset  . Heart disease Mother   . Heart disease Father   . Cancer Sister     "perineum"  . Stomach cancer Paternal Grandmother     Allergies  Allergen Reactions  . Hydromorphone Swelling and Other (See Comments)    phlebitis  . Morphine And Related Other (See Comments)    Phlebitis, makes pt out of it   . Codeine Other (See Comments)    Dizzy, mental status changes    Current Outpatient Prescriptions on File Prior to Visit  Medication Sig Dispense Refill  . carbamazepine (TEGRETOL) 200 MG tablet Take 1 tablet (200 mg total) by mouth 3 (three) times daily. 90 tablet 1  . escitalopram (LEXAPRO) 5 MG tablet Take 1 tablet (5 mg total) by mouth daily. 90 tablet 3  . Glycerin, Adult, 2.1 G SUPP Place one suppository per rectum daily as needed until BM 25 suppository 1  . Lactulose SOLN 15 mLs by Does not apply route 2 (two) times daily. 1000 mL 1  . metoCLOPramide (REGLAN) 5 MG/5ML solution Take 5 mLs (5 mg total) by mouth 3 (three) times daily before meals. 240 mL 0  . Nutritional Supplements (FEEDING SUPPLEMENT, JEVITY 1.5 CAL/FIBER,) LIQD 1.5 cans Jevity 1.5 QID with 130 ml free water before and after each bolus feeding as tolerated via feeding tube. 1422 mL   . omeprazole (PRILOSEC) 40 MG capsule Take 1 capsule (40 mg total) by mouth daily as needed. (Patient taking differently: Take 40 mg by mouth daily as needed  (stomach acid). ) 90 capsule 3  . ondansetron (ZOFRAN) 4 MG/5ML solution Take 10 mLs (8 mg total) by mouth every 8 (eight) hours as needed for nausea or vomiting. 50 mL 6  . ondansetron (ZOFRAN-ODT) 8 MG disintegrating tablet Take 8 mg by mouth daily as needed for nausea or vomiting.   6  .  polyethylene glycol (MIRALAX / GLYCOLAX) packet Instill 1 pack daily via tube 30 each 1  . traMADol (ULTRAM) 50 MG tablet Take 1 tablet (50 mg total) by mouth every 8 (eight) hours as needed. (Patient taking differently: Take 50 mg by mouth every 8 (eight) hours as needed for moderate pain. ) 90 tablet 2  . Travoprost, BAK Free, (TRAVATAMN) 0.004 % SOLN ophthalmic solution Place 1 drop into both eyes at bedtime.    Marland Kitchen UNABLE TO FIND Med Name:  Z-Quil liquid by mouth at bedtime to help sleep.     Current Facility-Administered Medications on File Prior to Visit  Medication Dose Route Frequency Provider Last Rate Last Dose  . topical emolient (BIAFINE) emulsion   Topical PRN Kyung Rudd, MD        BP 128/70 mmHg  Pulse 63  Temp(Src) 98 F (36.7 C) (Oral)  Resp 18  Ht 5\' 10"  (1.778 m)  Wt 170 lb (77.111 kg)  BMI 24.39 kg/m2  SpO2 98%     Review of Systems  Constitutional: Negative for fever, chills, appetite change and fatigue.  HENT: Negative for congestion, dental problem, ear pain, hearing loss, sore throat, tinnitus, trouble swallowing and voice change.   Eyes: Negative for pain, discharge and visual disturbance.  Respiratory: Negative for cough, chest tightness, wheezing and stridor.   Cardiovascular: Negative for chest pain, palpitations and leg swelling.  Gastrointestinal: Negative for nausea, vomiting, abdominal pain, diarrhea, constipation, blood in stool and abdominal distention.  Genitourinary: Negative for urgency, hematuria, flank pain, discharge, difficulty urinating and genital sores.  Musculoskeletal: Positive for gait problem. Negative for myalgias, back pain, joint swelling,  arthralgias and neck stiffness.  Skin: Negative for rash.  Neurological: Positive for dizziness, weakness and headaches. Negative for syncope, speech difficulty and numbness.  Hematological: Negative for adenopathy. Does not bruise/bleed easily.  Psychiatric/Behavioral: Negative for behavioral problems and dysphoric mood. The patient is not nervous/anxious.        Objective:   Physical Exam  Constitutional: He is oriented to person, place, and time. He appears well-developed.  Blood pressure 110/70  HENT:  Head: Normocephalic.  Right Ear: External ear normal.  Left Ear: External ear normal.  Eyes: Conjunctivae and EOM are normal.  Neck: Normal range of motion.  Cardiovascular: Normal rate and normal heart sounds.   Pulmonary/Chest: Breath sounds normal.  Abdominal: Bowel sounds are normal.  Musculoskeletal: Normal range of motion. He exhibits no edema or tenderness.  Neurological: He is alert and oriented to person, place, and time.  Psychiatric: He has a normal mood and affect. His behavior is normal.          Assessment & Plan:   Essential hypertension, stable off medication Esophageal stenosis status post gastrostomy History of malignant neoplasm of the base of the tongue.  Follow-up oncology Trigeminal neuralgia.  New prescription for hydrocodone dispensed.  Patient has been intolerant of codeine but states that he does well with hydrocodone  Check updated lab Preventive health.  Flu vaccine administered Recheck 6 months Oncology follow-up in January as scheduled  Trial off metoclopramide

## 2015-01-21 NOTE — Patient Instructions (Signed)
Oncology follow-up as scheduled  Trial off metoclopramide  Return in 6 months for follow-up

## 2015-01-21 NOTE — Progress Notes (Signed)
Pre visit review using our clinic review tool, if applicable. No additional management support is needed unless otherwise documented below in the visit note. 

## 2015-01-27 ENCOUNTER — Ambulatory Visit: Payer: Medicare Other | Admitting: Internal Medicine

## 2015-02-04 ENCOUNTER — Ambulatory Visit: Payer: Medicare Other | Admitting: Internal Medicine

## 2015-02-06 ENCOUNTER — Ambulatory Visit (HOSPITAL_BASED_OUTPATIENT_CLINIC_OR_DEPARTMENT_OTHER): Payer: Medicare Other | Admitting: Hematology and Oncology

## 2015-02-06 ENCOUNTER — Encounter: Payer: Self-pay | Admitting: Hematology and Oncology

## 2015-02-06 ENCOUNTER — Other Ambulatory Visit: Payer: Medicare Other

## 2015-02-06 ENCOUNTER — Telehealth: Payer: Self-pay | Admitting: Hematology and Oncology

## 2015-02-06 VITALS — BP 162/74 | HR 60 | Temp 98.7°F | Resp 18 | Ht 70.0 in | Wt 168.8 lb

## 2015-02-06 DIAGNOSIS — C439 Malignant melanoma of skin, unspecified: Secondary | ICD-10-CM | POA: Diagnosis not present

## 2015-02-06 DIAGNOSIS — C01 Malignant neoplasm of base of tongue: Secondary | ICD-10-CM | POA: Diagnosis not present

## 2015-02-06 DIAGNOSIS — Z85820 Personal history of malignant melanoma of skin: Secondary | ICD-10-CM

## 2015-02-06 DIAGNOSIS — Z85810 Personal history of malignant neoplasm of tongue: Secondary | ICD-10-CM

## 2015-02-06 DIAGNOSIS — R972 Elevated prostate specific antigen [PSA]: Secondary | ICD-10-CM

## 2015-02-06 DIAGNOSIS — Z853 Personal history of malignant neoplasm of breast: Secondary | ICD-10-CM

## 2015-02-06 DIAGNOSIS — R110 Nausea: Secondary | ICD-10-CM

## 2015-02-06 DIAGNOSIS — Z931 Gastrostomy status: Secondary | ICD-10-CM

## 2015-02-06 DIAGNOSIS — I10 Essential (primary) hypertension: Secondary | ICD-10-CM

## 2015-02-06 NOTE — Assessment & Plan Note (Signed)
I refer him to urology last year that he did not go. His PSA has not been checked. He is not symptomatic. I discussed with him the benefits of urology consultation but the patient declined. In the meantime, we will follow serial PSA monitoring and clinical follow-up only.

## 2015-02-06 NOTE — Assessment & Plan Note (Signed)
PET CT scan in 2015 was negative. I recommend close follow-up with dermatologist. Recommend skin protection with sunscreen when he goes out. 

## 2015-02-06 NOTE — Assessment & Plan Note (Signed)
Clinically, no evidence of disease 

## 2015-02-06 NOTE — Telephone Encounter (Signed)
Gave patient/wife avs report and appointments for July 2017.

## 2015-02-06 NOTE — Assessment & Plan Note (Addendum)
he has intermittent high blood pressure. I recommend close follow-up with primary care doctor for BP monitoring

## 2015-02-06 NOTE — Assessment & Plan Note (Signed)
Clinically, he had no signs of cancer recurrence. We will continue surveillance history and physical examination.

## 2015-02-06 NOTE — Assessment & Plan Note (Addendum)
Feeding tube site looks okay without any signs of infection. The patient is fully dependent on feeding tube for nutritional needs. He will continue indefinitely.  his weight has been stable.

## 2015-02-06 NOTE — Progress Notes (Signed)
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 He received definitive concurrent chemoradiation with weekly Carboplatin/Taxol on 06/14/2011; and daily XRT 06/21/11. Chemotherapy completed on 07/26/11 (last dose held due to fatigue, anorexia, and mucositis). Radiation completed on 08/04/11. The patient also has background history of breast cancer who was treated with surgery and tamoxifen. He denies any palpable chest wall mass. On 12/20/2013, he underwent resection of the right temporal region for melanoma. PET CT scan on 01/31/2014 showed lesion in the prostate but no evidence of other cancer.  INTERVAL HISTORY: Please see below for problem oriented charting. His feeding tube almost fell out recently and it has to be replaced. Since then, he has intermittent burning sensation, similar to prior visit. He denies new skin lesion. He is dependent on feeding tube for nutritional supplement.  His wife is able to give him 1-1/2 cans of feeding 4 times a day His weight has been stable. He denies any urination difficulties. He denies major dysuria or urinary frequency.  REVIEW OF SYSTEMS:   Constitutional: Denies fevers, chills or abnormal weight loss Eyes: Denies blurriness of vision Ears, nose, mouth, throat, and face: Denies mucositis or sore throat Respiratory: Denies cough, dyspnea or wheezes Cardiovascular: Denies palpitation, chest discomfort or lower extremity swelling Lymphatics: Denies new lymphadenopathy or easy bruising Neurological:Denies numbness, tingling or new  weaknesses Behavioral/Psych: Mood is stable, no new changes  All other systems were reviewed with the patient and are negative.  I have reviewed the past medical history, past surgical history, social history and family history with the patient and they are unchanged from previous note.  ALLERGIES:  is allergic to hydromorphone; morphine and related; and codeine.  MEDICATIONS:  Current Outpatient Prescriptions  Medication Sig Dispense Refill  . carbamazepine (TEGRETOL) 200 MG tablet Take 1 tablet (200 mg total) by mouth 3 (three) times daily. 90 tablet 1  . escitalopram (LEXAPRO) 5 MG tablet Take 1 tablet (5 mg total) by mouth daily. 90 tablet 3  . Glycerin, Adult, 2.1 G SUPP Place one suppository per rectum daily as needed until BM 25 suppository 1  . HYDROcodone-acetaminophen (NORCO) 10-325 MG tablet Take 1 tablet by mouth every 8 (eight) hours as needed. 30 tablet 0  . Lactulose SOLN 15 mLs by Does not apply route 2 (two) times daily. 1000 mL 1  . Nutritional Supplements (FEEDING SUPPLEMENT, JEVITY 1.5 CAL/FIBER,) LIQD 1.5 cans Jevity 1.5 QID with 130 ml free water before and after each bolus feeding as tolerated via feeding tube. 1422 mL   . omeprazole (PRILOSEC) 40 MG capsule Take 1 capsule (40 mg total) by mouth daily as needed. (Patient taking differently: Take 40 mg by mouth daily as needed (stomach acid). ) 90 capsule 3  . ondansetron (ZOFRAN) 4 MG/5ML solution Take 10 mLs (8 mg total) by mouth every 8 (eight) hours as needed for nausea or vomiting. 50 mL 6  . ondansetron (ZOFRAN-ODT) 8 MG disintegrating tablet Take 8 mg by mouth daily as needed for nausea or vomiting.   6  . polyethylene glycol (MIRALAX / GLYCOLAX) packet Instill 1 pack daily via tube 30 each 1  . traMADol (  ULTRAM) 50 MG tablet Take 1 tablet (50 mg total) by mouth every 8 (eight) hours as needed. (Patient taking differently: Take 50 mg by mouth every 8 (eight) hours as needed for moderate pain. ) 90 tablet 2  .  Travoprost, BAK Free, (TRAVATAMN) 0.004 % SOLN ophthalmic solution Place 1 drop into both eyes at bedtime.    Marland Kitchen UNABLE TO FIND Med Name:  Z-Quil liquid by mouth at bedtime to help sleep.     No current facility-administered medications for this visit.   Facility-Administered Medications Ordered in Other Visits  Medication Dose Route Frequency Provider Last Rate Last Dose  . topical emolient (BIAFINE) emulsion   Topical PRN Kyung Rudd, MD        PHYSICAL EXAMINATION: ECOG PERFORMANCE STATUS: 1 - Symptomatic but completely ambulatory  Filed Vitals:   02/06/15 1446  BP: 162/74  Pulse: 60  Temp: 98.7 F (37.1 C)  Resp: 18   Filed Weights   02/06/15 1446  Weight: 168 lb 12.8 oz (76.567 kg)    GENERAL:alert, no distress and comfortable SKIN:  He has numerous small central actinic keratosis. Nothing suspicious for melanoma EYES: normal, Conjunctiva are pink and non-injected, sclera clear OROPHARYNX:no exudate, no erythema and lips, buccal mucosa, and tongue normal . He has no teeth NECK: supple, thyroid normal size, non-tender, without nodularity LYMPH:  no palpable lymphadenopathy in the cervical, axillary or inguinal LUNGS: clear to auscultation and percussion with normal breathing effort HEART: regular rate & rhythm and no murmurs and no lower extremity edema ABDOMEN:abdomen soft, non-tender and normal bowel sounds. Feeding tube site looks okay Musculoskeletal:no cyanosis of digits and no clubbing  NEURO: alert & oriented x 3 with fluent speech, no focal motor/sensory deficits  well-healed mastectomy scar on the right chest wall without any signs of recurrence LABORATORY DATA:  I have reviewed the data as listed    Component Value Date/Time   NA 139 01/21/2015 0906   NA 143 12/27/2013 1300   K 4.3 01/21/2015 0906   K 5.1 12/27/2013 1300   CL 103 01/21/2015 0906   CL 106 06/06/2012 1041   CO2 27 01/21/2015 0906   CO2 32* 12/27/2013 1300   GLUCOSE 150* 01/21/2015 0906    GLUCOSE 142* 12/27/2013 1300   GLUCOSE 148* 06/06/2012 1041   BUN 25* 01/21/2015 0906   BUN 30.7* 12/27/2013 1300   CREATININE 1.28 01/21/2015 0906   CREATININE 1.3 12/27/2013 1300   CALCIUM 9.3 01/21/2015 0906   CALCIUM 9.6 12/27/2013 1300   PROT 6.6 01/21/2015 0906   PROT 7.0 12/27/2013 1300   ALBUMIN 3.8 01/21/2015 0906   ALBUMIN 3.7 12/27/2013 1300   AST 17 01/21/2015 0906   AST 17 12/27/2013 1300   ALT 17 01/21/2015 0906   ALT 16 12/27/2013 1300   ALKPHOS 104 01/21/2015 0906   ALKPHOS 98 12/27/2013 1300   BILITOT 0.6 01/21/2015 0906   BILITOT 0.26 12/27/2013 1300   GFRNONAA 67* 05/19/2013 0542   GFRAA 77* 05/19/2013 0542    No results found for: SPEP, UPEP  Lab Results  Component Value Date   WBC 4.7 01/21/2015   NEUTROABS 3.6 01/21/2015   HGB 14.0 01/21/2015   HCT 41.7 01/21/2015   MCV 93.7 01/21/2015   PLT 225.0 01/21/2015      Chemistry      Component Value Date/Time   NA 139 01/21/2015 0906   NA 143 12/27/2013 1300   K 4.3 01/21/2015 0906   K 5.1 12/27/2013 1300  CL 103 01/21/2015 0906   CL 106 06/06/2012 1041   CO2 27 01/21/2015 0906   CO2 32* 12/27/2013 1300   BUN 25* 01/21/2015 0906   BUN 30.7* 12/27/2013 1300   CREATININE 1.28 01/21/2015 0906   CREATININE 1.3 12/27/2013 1300      Component Value Date/Time   CALCIUM 9.3 01/21/2015 0906   CALCIUM 9.6 12/27/2013 1300   ALKPHOS 104 01/21/2015 0906   ALKPHOS 98 12/27/2013 1300   AST 17 01/21/2015 0906   AST 17 12/27/2013 1300   ALT 17 01/21/2015 0906   ALT 16 12/27/2013 1300   BILITOT 0.6 01/21/2015 0906   BILITOT 0.26 12/27/2013 1300      ASSESSMENT & PLAN:  History of tongue cancer  Clinically, he had no signs of cancer recurrence. We will continue surveillance history and physical examination.  Essential hypertension he has intermittent high blood pressure. I recommend close follow-up with primary care doctor for BP monitoring  History of malignant neoplasm of male  breast Clinically, no evidence of disease    S/P gastrostomy Feeding tube site looks okay without any signs of infection. The patient is fully dependent on feeding tube for nutritional needs. He will continue indefinitely.  his weight has been stable.  Elevated PSA  I refer him to urology last year that he did not go. His PSA has not been checked. He is not symptomatic. I discussed with him the benefits of urology consultation but the patient declined. In the meantime, we will follow serial PSA monitoring and clinical follow-up only.  History of melanoma PET CT scan in 2015 was negative. I recommend close follow-up with dermatologist. Recommend skin protection with sunscreen when he goes out.     Orders Placed This Encounter  Procedures  . Comprehensive metabolic panel    Standing Status: Future     Number of Occurrences:      Standing Expiration Date: 03/12/2016  . CBC with Differential/Platelet    Standing Status: Future     Number of Occurrences:      Standing Expiration Date: 03/12/2016  . PSA    Standing Status: Future     Number of Occurrences:      Standing Expiration Date: 03/12/2016   All questions were answered. The patient knows to call the clinic with any problems, questions or concerns. No barriers to learning was detected. I spent 25 minutes counseling the patient face to face. The total time spent in the appointment was 30 minutes and more than 50% was on counseling and review of test results     [facility], [place], MD 02/06/2015 3:47 PM

## 2015-04-30 ENCOUNTER — Ambulatory Visit: Payer: Self-pay | Admitting: Radiation Oncology

## 2015-05-07 ENCOUNTER — Encounter: Payer: Self-pay | Admitting: Radiation Oncology

## 2015-05-07 ENCOUNTER — Ambulatory Visit
Admission: RE | Admit: 2015-05-07 | Discharge: 2015-05-07 | Disposition: A | Discharge status: Home / Self Care | Payer: Medicare Other | Source: Ambulatory Visit | Attending: Radiation Oncology | Admitting: Radiation Oncology

## 2015-05-07 VITALS — BP 143/97 | HR 65 | Temp 97.9°F | Resp 20 | Ht 70.0 in

## 2015-05-07 DIAGNOSIS — C109 Malignant neoplasm of oropharynx, unspecified: Secondary | ICD-10-CM

## 2015-05-07 NOTE — Progress Notes (Signed)
Radiation Oncology         [phone] ________________________________  Name: Jose [name] MRN: [id number]  Date: 05/07/2015  DOB: 1928/02/13  Follow-Up Visit Note  CC: Nyoka Cowden, MD  Marletta Lor, MD  Diagnosis:   Base of tongue squamous cell carcinoma.  Interval Since Last Radiation:  The patient completed radiation treatment on 06/21/2011   Narrative: Jose [name] is a pleasant 82 y.o gentleman diagnosed with base of the tongue squamous cell carcinoma who completed treatment in 2013, and has since been NED. He comes today for follow up.  On review of symptoms, the patient reports that he is unable to consume food or fluids. He also reports that he is feeling extremly poorly. He stated that his energy level is extremely low. The patient and his wife became tearful during our discussion about how he is feeling overall. He reports that he is constantly producing thick sputum. He denies nausea. He also denies general pain except for heartburn in the evenings that usually lasts for about 10 seconds before resolving. He admits to disease related depression and anxiety about his cancer coming back. A complete review of systems was obtained and is otherwise negative.    11:50 AM BP 143/97 mmHg  Pulse 65  Temp(Src) 97.9 F (36.6 C) (Oral)  Resp 20  Ht 5\' 10"  (1.778 m)  Wt   SpO2 100%  Wt Readings from Last 3 Encounters:  02/06/15 168 lb 12.8 oz (76.567 kg)  01/21/15 170 lb (77.111 kg)  11/07/14 169 lb 12.8 oz (77.021 kg)    ALLERGIES:  is allergic to hydromorphone; morphine and related; and codeine.  Meds: Current Outpatient Prescriptions  Medication Sig Dispense Refill  . carbamazepine (TEGRETOL) 200 MG tablet Take 1 tablet (200 mg total) by mouth 3 (three) times daily. 90 tablet 1  . escitalopram (LEXAPRO) 5 MG tablet Take 1 tablet (5 mg total) by mouth daily. 90 tablet 3  . HYDROcodone-acetaminophen (NORCO) 10-325 MG tablet Take 1 tablet by mouth  every 8 (eight) hours as needed. 30 tablet 0  . Nutritional Supplements (FEEDING SUPPLEMENT, JEVITY 1.5 CAL/FIBER,) LIQD 1.5 cans Jevity 1.5 QID with 130 ml free water before and after each bolus feeding as tolerated via feeding tube. 1422 mL   . omeprazole (PRILOSEC) 40 MG capsule Take 1 capsule (40 mg total) by mouth daily as needed. (Patient taking differently: Take 40 mg by mouth daily as needed (stomach acid). ) 90 capsule 3  . ondansetron (ZOFRAN) 4 MG/5ML solution Take 10 mLs (8 mg total) by mouth every 8 (eight) hours as needed for nausea or vomiting. 50 mL 6  . ondansetron (ZOFRAN-ODT) 8 MG disintegrating tablet Take 8 mg by mouth daily as needed for nausea or vomiting.   6  . traMADol (ULTRAM) 50 MG tablet Take 1 tablet (50 mg total) by mouth every 8 (eight) hours as needed. (Patient taking differently: Take 50 mg by mouth every 8 (eight) hours as needed for moderate pain. ) 90 tablet 2  . Travoprost, BAK Free, (TRAVATAMN) 0.004 % SOLN ophthalmic solution Place 1 drop into both eyes at bedtime.    Marland Kitchen UNABLE TO FIND Med Name:  Z-Quil liquid by mouth at bedtime to help sleep.    . Glycerin, Adult, 2.1 G SUPP Place one suppository per rectum daily as needed until BM (Patient not taking: Reported on 05/07/2015) 25 suppository 1  . Lactulose SOLN 15 mLs by Does not apply route 2 (two) times daily. (Patient  not taking: Reported on 05/07/2015) 1000 mL 1  . polyethylene glycol (MIRALAX / GLYCOLAX) packet Instill 1 pack daily via tube (Patient not taking: Reported on 05/07/2015) 30 each 1   No current facility-administered medications for this encounter.   Facility-Administered Medications Ordered in Other Encounters  Medication Dose Route Frequency Provider Last Rate Last Dose  . topical emolient (BIAFINE) emulsion   Topical PRN Kyung Rudd, MD        Physical Findings:  height is 5\' 10"  (1.778 m). His oral temperature is 97.9 F (36.6 C). His blood pressure is 143/97 and his pulse is 65. His  respiration is 20 and oxygen saturation is 100%.    Wt Readings from Last 3 Encounters:  02/06/15 168 lb 12.8 oz (76.567 kg)  01/21/15 170 lb (77.111 kg)  11/07/14 169 lb 12.8 oz (77.021 kg)   Pain scale 0/10   The patient is alert and oriented x4 appropriate throughout the examination. Cardiovascular exam reveals a regular rate and rhythm, no clicks rubs or murmurs auscultated. Chest is clear to auscultation bilaterally. No palpable adenopathy is noted of the supraclavicular, cervical or axillary chains. HEENT reveals that he is normocephalic atraumatic, EOMs are intact. The oropharynx is grossly intact without any visible lesions posteriorly. There is thick saliva on the tongue but no lesions.  Lab Findings: Lab Results  Component Value Date   WBC 4.7 01/21/2015   HGB 14.0 01/21/2015   HCT 41.7 01/21/2015   MCV 93.7 01/21/2015   PLT 225.0 01/21/2015     Radiographic Findings: No results found.  Impression: The patient remains clinically NED at this time for his head and neck cancer. He continues to be feeding tube dependent.   Plan: The patient appears to be NED, though we did not have access to a laryngoscope to formally evaluate the tongue base. We will ask him to return in about 3 months for follow up. We have also discussed the role of meeting with a counselor for his depression surrounding his diagnosis and associated loss of control for eating/drinking.    The above documentation reflects my direct findings during this shared patient visit. Please see the separate note by Dr. Lisbeth Renshaw on this date for the remainder of the patient's plan of care.  Carola Rhine, PAC  This document serves as a record of services personally performed by Shona Simpson, PA and Kyung Rudd, MD. It was created on their behalf by Jenell Milliner, a trained medical scribe. The creation of this record is based on the scribe's personal observations and the provider's statements to them. This document  has been checked and approved by the attending provider.

## 2015-05-07 NOTE — Progress Notes (Signed)
Follow up s/p rad  4 years completed 06/21/2011 tongue, tongue is coated white , jevity 1.2 cal  6 cans via feeding tube daily with 160ml free watr before and after feedings, very weak, ambulating slow with a walker, got weak almost to door at nursing sat patient down in a chair and got a w/c to go to dressing room 5, patient knees started to buckle, nothing food or fluids by mouth no c/o pian or nuaea, except in the evenings heartburn stated about 10 seconds then resolves, offered warm blanket, pt declined 11:22 AM BP 143/97 mmHg  Pulse 65  Temp(Src) 97.9 F (36.6 C) (Oral)  Resp 20  Ht 5\' 10"  (1.778 m)  Wt   SpO2 100%  Wt Readings from Last 3 Encounters:  02/06/15 168 lb 12.8 oz (76.567 kg)  01/21/15 170 lb (77.111 kg)  11/07/14 169 lb 12.8 oz (77.021 kg)

## 2015-05-07 NOTE — Patient Instructions (Signed)
Contact our office if you have any questions following today's appointment: [phone].  

## 2015-06-07 ENCOUNTER — Encounter (HOSPITAL_COMMUNITY): Payer: Self-pay | Admitting: Emergency Medicine

## 2015-06-07 ENCOUNTER — Emergency Department (HOSPITAL_COMMUNITY)
Admission: EM | Admit: 2015-06-07 | Discharge: 2015-06-07 | Disposition: A | Discharge status: Home / Self Care | Payer: Medicare Other | Attending: Emergency Medicine | Admitting: Emergency Medicine

## 2015-06-07 ENCOUNTER — Emergency Department (HOSPITAL_COMMUNITY): Payer: Medicare Other

## 2015-06-07 DIAGNOSIS — Z923 Personal history of irradiation: Secondary | ICD-10-CM | POA: Insufficient documentation

## 2015-06-07 DIAGNOSIS — Z85820 Personal history of malignant melanoma of skin: Secondary | ICD-10-CM | POA: Insufficient documentation

## 2015-06-07 DIAGNOSIS — Z79899 Other long term (current) drug therapy: Secondary | ICD-10-CM | POA: Insufficient documentation

## 2015-06-07 DIAGNOSIS — Z9221 Personal history of antineoplastic chemotherapy: Secondary | ICD-10-CM | POA: Insufficient documentation

## 2015-06-07 DIAGNOSIS — N189 Chronic kidney disease, unspecified: Secondary | ICD-10-CM | POA: Insufficient documentation

## 2015-06-07 DIAGNOSIS — Z931 Gastrostomy status: Secondary | ICD-10-CM | POA: Insufficient documentation

## 2015-06-07 DIAGNOSIS — F419 Anxiety disorder, unspecified: Secondary | ICD-10-CM | POA: Diagnosis not present

## 2015-06-07 DIAGNOSIS — Z8639 Personal history of other endocrine, nutritional and metabolic disease: Secondary | ICD-10-CM | POA: Insufficient documentation

## 2015-06-07 DIAGNOSIS — Y9389 Activity, other specified: Secondary | ICD-10-CM | POA: Insufficient documentation

## 2015-06-07 DIAGNOSIS — Z87891 Personal history of nicotine dependence: Secondary | ICD-10-CM | POA: Diagnosis not present

## 2015-06-07 DIAGNOSIS — W19XXXA Unspecified fall, initial encounter: Secondary | ICD-10-CM

## 2015-06-07 DIAGNOSIS — Z862 Personal history of diseases of the blood and blood-forming organs and certain disorders involving the immune mechanism: Secondary | ICD-10-CM | POA: Diagnosis not present

## 2015-06-07 DIAGNOSIS — Y9289 Other specified places as the place of occurrence of the external cause: Secondary | ICD-10-CM | POA: Diagnosis not present

## 2015-06-07 DIAGNOSIS — Z86010 Personal history of colonic polyps: Secondary | ICD-10-CM | POA: Diagnosis not present

## 2015-06-07 DIAGNOSIS — M19012 Primary osteoarthritis, left shoulder: Secondary | ICD-10-CM | POA: Insufficient documentation

## 2015-06-07 DIAGNOSIS — Z853 Personal history of malignant neoplasm of breast: Secondary | ICD-10-CM | POA: Insufficient documentation

## 2015-06-07 DIAGNOSIS — K219 Gastro-esophageal reflux disease without esophagitis: Secondary | ICD-10-CM | POA: Insufficient documentation

## 2015-06-07 DIAGNOSIS — S8002XA Contusion of left knee, initial encounter: Secondary | ICD-10-CM | POA: Insufficient documentation

## 2015-06-07 DIAGNOSIS — W01198A Fall on same level from slipping, tripping and stumbling with subsequent striking against other object, initial encounter: Secondary | ICD-10-CM | POA: Insufficient documentation

## 2015-06-07 DIAGNOSIS — Z87442 Personal history of urinary calculi: Secondary | ICD-10-CM | POA: Diagnosis not present

## 2015-06-07 DIAGNOSIS — I129 Hypertensive chronic kidney disease with stage 1 through stage 4 chronic kidney disease, or unspecified chronic kidney disease: Secondary | ICD-10-CM | POA: Insufficient documentation

## 2015-06-07 DIAGNOSIS — H409 Unspecified glaucoma: Secondary | ICD-10-CM | POA: Diagnosis not present

## 2015-06-07 DIAGNOSIS — M19011 Primary osteoarthritis, right shoulder: Secondary | ICD-10-CM | POA: Insufficient documentation

## 2015-06-07 DIAGNOSIS — M170 Bilateral primary osteoarthritis of knee: Secondary | ICD-10-CM | POA: Insufficient documentation

## 2015-06-07 DIAGNOSIS — R410 Disorientation, unspecified: Secondary | ICD-10-CM | POA: Insufficient documentation

## 2015-06-07 DIAGNOSIS — Y998 Other external cause status: Secondary | ICD-10-CM | POA: Insufficient documentation

## 2015-06-07 DIAGNOSIS — S8992XA Unspecified injury of left lower leg, initial encounter: Secondary | ICD-10-CM | POA: Diagnosis present

## 2015-06-07 LAB — URINALYSIS, ROUTINE W REFLEX MICROSCOPIC
BILIRUBIN URINE: NEGATIVE
GLUCOSE, UA: NEGATIVE mg/dL
Hgb urine dipstick: NEGATIVE
KETONES UR: NEGATIVE mg/dL
LEUKOCYTES UA: NEGATIVE
NITRITE: NEGATIVE
PROTEIN: 100 mg/dL — AB
Specific Gravity, Urine: 1.018 (ref 1.005–1.030)
pH: 7 (ref 5.0–8.0)

## 2015-06-07 LAB — BASIC METABOLIC PANEL
Anion gap: 8 (ref 5–15)
BUN: 35 mg/dL — AB (ref 6–20)
CALCIUM: 8.8 mg/dL — AB (ref 8.9–10.3)
CO2: 27 mmol/L (ref 22–32)
CREATININE: 1.16 mg/dL (ref 0.61–1.24)
Chloride: 101 mmol/L (ref 101–111)
GFR calc Af Amer: >60 mL/min (ref >60)
GFR, EST NON AFRICAN AMERICAN: 56 mL/min — AB (ref >60)
GLUCOSE: 158 mg/dL — AB (ref 65–99)
Potassium: 4.4 mmol/L (ref 3.5–5.1)
Sodium: 136 mmol/L (ref 135–145)

## 2015-06-07 LAB — CBC WITH DIFFERENTIAL/PLATELET
BASOS ABS: 0 10*3/uL (ref 0.0–0.1)
BASOS PCT: 1 %
EOS PCT: 1 %
Eosinophils Absolute: 0 10*3/uL (ref 0.0–0.7)
HCT: 40.3 % (ref 39.0–52.0)
Hemoglobin: 13.9 g/dL (ref 13.0–17.0)
Lymphocytes Relative: 11 %
Lymphs Abs: 0.6 10*3/uL — ABNORMAL LOW (ref 0.7–4.0)
MCH: 31.8 pg (ref 26.0–34.0)
MCHC: 34.5 g/dL (ref 30.0–36.0)
MCV: 92.2 fL (ref 78.0–100.0)
MONO ABS: 1 10*3/uL (ref 0.1–1.0)
MONOS PCT: 17 %
Neutro Abs: 4.2 10*3/uL (ref 1.7–7.7)
Neutrophils Relative %: 70 %
PLATELETS: 231 10*3/uL (ref 150–400)
RBC: 4.37 MIL/uL (ref 4.22–5.81)
RDW: 12.4 % (ref 11.5–15.5)
WBC: 5.9 10*3/uL (ref 4.0–10.5)

## 2015-06-07 LAB — URINE MICROSCOPIC-ADD ON

## 2015-06-07 NOTE — ED Notes (Signed)
Pt reports his knees gave out this am. Pt reports his L knee has been weak for the past week. Denies foot weakness. Pt fell and hit forehead on cabinet on his way down. Pt has small hematoma to R forehead. No LOC. Not on blood thinners.

## 2015-06-07 NOTE — Discharge Instructions (Signed)
Combined Knee Ligament Sprain °Combined knee ligament sprain is a tear of more than one of the major ligaments of the knee. The four knee ligaments are the anterior cruciate ligament (ACL), posterior cruciate ligament (PCL), medial collateral ligament (MCL) and lateral collateral ligament (LCL). Ligaments connect bones. They often cross a joint to hold the bones together. The ligaments of the knee keep the thigh bone (femur) and shinbone (tibia) in alignment. These ligaments allow the joint to move within a certain range of motion. Movement outside this range causes a ligament strain. Injury to multiple ligaments at the same time results in difficulty playing sports and in daily living. The most common multiple knee ligament injury involves the ACL and MCL. °SYMPTOMS  °· A "popping" sound heard or felt at the time of injury. °· Inability to continue activity after injury. °· Inflammation of the knee within 6 hours after injury. °· Possibly, deformity of the knee. °· Inability to straighten the knee. °· Feeling of the knee giving way or buckling. °· Sometimes, locking of the knee, if the joint cartilage (meniscus) is injured. °· Rarely, numbness, weakness, paralysis, discoloration, or coldness, due to nerve or blood vessel injury. °CAUSES  °Spraining of multiple ligaments occurs when a force is placed on the ligaments that exceeds their strength. This is often caused by a direct hit (trauma). It may also be caused by a non-contact injury (hyperextending the knee while twisting it).  °RISK INCREASES WITH: °· Contact sports (football, rugby, lacrosse). Sports that involve pivoting, jumping, cutting, or changing direction (basketball, gymnastics, soccer, volleyball). Sports on uneven ground (cross-country running, soccer). °· Poor strength and/or flexibility. °· Improper fitted or padded equipment. °PREVENTION °· Warm up and stretch properly before activity. °· Maintain physical fitness: °¨ Thigh, leg, and knee  flexibility. °¨ Muscle strength and endurance. °· Learn and use proper exercise technique. °· Wear proper and well fitting equipment (correct length of cleats for surface). °PROGNOSIS  °Without treatment, the knee will continue to give way and become vulnerable to recurring injury. Recurring injury can happen during athletics or daily living. If the injury includes damage to a nerve or artery, the chance of a poor outcome increases. Surgery is often needed to regain stability of the knee. °RELATED COMPLICATIONS  °Frequently recurring symptoms, including: °· Knee giving way. °· Joint instability. °· Inflammation. °· Injury to the joint cartilage (meniscus). This may result in locking and/or swelling of the knee. °· Injury to joint (articular) cartilage of the thigh bone or shinbone. This may result in arthritis of the knee. °· Injury to other ligaments of the knee. °· Knee stiffness (loss of knee motion). °· Permanent injury to nerves (numbness, weakness, or paralysis) or arteries. °· Removal (amputation) of the leg, due to nerve or artery injury. °TREATMENT  °Treatment first involves medicine and ice, to reduce pain and inflammation. Crutches may be advised, to decrease pain while walking. The knee may be restrained. Rehabilitation focuses on reducing swelling, regaining range of motion, and regaining muscle control and strength. It may also include receiving proper use training, wearing a brace, and education. (Avoid sports that involve pivoting, cutting, changing direction, jumping and landing). Surgery often offers the best chance for full recovery. Surgery from combined ACL/MCL injury involves replacement (reconstruction) of the ACL. This also allows for MCL healing. Despite surgery, some athletes may never return to their prior level of competition. The ability to return to sports depends on the related injuries and demands of the sport.  °MEDICATION  °·   If pain medicine is needed, nonsteroidal  anti-inflammatory medicines (aspirin and ibuprofen), or other minor pain relievers (acetaminophen), are often advised.  Do not take pain medicine for 7 days before surgery.  Stronger pain relievers may be prescribed. Use only as directed and only as much as you need.  Contact your caregiver immediately if any bleeding, stomach upset, or signs of an allergic reaction occur. COLD THERAPY  Cold treatment (icing) should be applied for 10 to 15 minutes every 2 to 3 hours for inflammation and pain, and immediately after activity that aggravates your symptoms. Use ice packs or an ice massage. SEEK MEDICAL CARE IF:   Symptoms get worse or do not improve in 6 weeks, despite treatment.  After injury or surgery, any of the following occur:  Pain, numbness, coldness, or a blue, gray, or dark color occurs in the foot or toenails.  Increased pain, swelling, redness, drainage of fluids, or bleeding in the affected area.  Signs of infection (headache, muscle aches, dizziness, or a general ill feeling with fever).  New, unexplained symptoms develop. (Drugs used in treatment may produce side effects.)   This information is not intended to replace advice given to you by your health care provider. Make sure you discuss any questions you have with your health care provider.   Document Released: 03/08/2005 Document Revised: 05/31/2011 Document Reviewed: 05/10/2014 Elsevier Interactive Patient Education 2016 [place] in the Home  Falls can cause injuries and can affect people from all age groups. There are many simple things that you can do to make your home safe and to help prevent falls. WHAT CAN I DO ON THE OUTSIDE OF MY HOME?  Regularly repair the edges of walkways and driveways and fix any cracks.  Remove high doorway thresholds.  Trim any shrubbery on the main path into your home.  Use bright outdoor lighting.  Clear walkways of debris and clutter, including tools and  rocks.  Regularly check that handrails are securely fastened and in good repair. Both sides of any steps should have handrails.  Install guardrails along the edges of any raised decks or porches.  Have leaves, snow, and ice cleared regularly.  Use sand or salt on walkways during winter months.  In the garage, clean up any spills right away, including grease or oil spills. WHAT CAN I DO IN THE BATHROOM?  Use night lights.  Install grab bars by the toilet and in the tub and shower. Do not use towel bars as grab bars.  Use non-skid mats or decals on the floor of the tub or shower.  If you need to sit down while you are in the shower, use a plastic, non-slip stool.Marland Kitchen  Keep the floor dry. Immediately clean up any water that spills on the floor.  Remove soap buildup in the tub or shower on a regular basis.  Attach bath mats securely with double-sided non-slip rug tape.  Remove throw rugs and other tripping hazards from the floor. WHAT CAN I DO IN THE BEDROOM?  Use night lights.  Make sure that a bedside light is easy to reach.  Do not use oversized bedding that drapes onto the floor.  Have a firm chair that has side arms to use for getting dressed.  Remove throw rugs and other tripping hazards from the floor. WHAT CAN I DO IN THE KITCHEN?   Clean up any spills right away.  Avoid walking on wet floors.  Place frequently used items in easy-to-reach places.  If you need to reach for something above you, use a sturdy step stool that has a grab bar.  Keep electrical cables out of the way.  Do not use floor polish or wax that makes floors slippery. If you have to use wax, make sure that it is non-skid floor wax.  Remove throw rugs and other tripping hazards from the floor. WHAT CAN I DO IN THE STAIRWAYS?  Do not leave any items on the stairs.  Make sure that there are handrails on both sides of the stairs. Fix handrails that are broken or loose. Make sure that handrails  are as long as the stairways.  Check any carpeting to make sure that it is firmly attached to the stairs. Fix any carpet that is loose or worn.  Avoid having throw rugs at the top or bottom of stairways, or secure the rugs with carpet tape to prevent them from moving.  Make sure that you have a light switch at the top of the stairs and the bottom of the stairs. If you do not have them, have them installed. WHAT ARE SOME OTHER FALL PREVENTION TIPS?  Wear closed-toe shoes that fit well and support your feet. Wear shoes that have rubber soles or low heels.  When you use a stepladder, make sure that it is completely opened and that the sides are firmly locked. Have someone hold the ladder while you are using it. Do not climb a closed stepladder.  Add color or contrast paint or tape to grab bars and handrails in your home. Place contrasting color strips on the first and last steps.  Use mobility aids as needed, such as canes, walkers, scooters, and crutches.  Turn on lights if it is dark. Replace any light bulbs that burn out.  Set up furniture so that there are clear paths. Keep the furniture in the same spot.  Fix any uneven floor surfaces.  Choose a carpet design that does not hide the edge of steps of a stairway.  Be aware of any and all pets.  Review your medicines with your healthcare provider. Some medicines can cause dizziness or changes in blood pressure, which increase your risk of falling. Talk with your health care provider about other ways that you can decrease your risk of falls. This may include working with a physical therapist or trainer to improve your strength, balance, and endurance.   This information is not intended to replace advice given to you by your health care provider. Make sure you discuss any questions you have with your health care provider.   Document Released: 02/26/2002 Document Revised: 07/23/2014 Document Reviewed: 04/12/2014 Elsevier Interactive  Patient Education [employer].

## 2015-06-07 NOTE — ED Notes (Signed)
Pt to CT

## 2015-06-07 NOTE — ED Notes (Signed)
UNABLE TO COLLECT LABS PATIENT IS IN XRAY

## 2015-06-07 NOTE — ED Notes (Signed)
Pt ambulated from bed to hallway with the assistance of a walker.

## 2015-06-07 NOTE — ED Provider Notes (Signed)
CSN: [id number]     Arrival date & time 06/07/15  1334 History   First MD Initiated Contact with Patient 06/07/15 1608     Chief Complaint  Patient presents with  . Knee Pain  . Fall     Patient is a 82 y.o. male presenting with knee pain and fall. The history is provided by the patient and the spouse. No language interpreter was used.  Knee Pain Fall   Jose [name] is a 82 y.o. male who presents to the Emergency Department complaining of fall, knee pain.  Hx is provided by the patient and his wife.  Level V caveat due to confusion.  Pt reports weakness and "giving out" in his left knee last night and he hit his head a few times.  He does not recall all events but does not think he passed out.   He has been weak in that knee for the last few weeks.  According to his wife he has had multiple falls over the last two weeks.  Wife states that he has been unable to ambulate since this fall and takes a two person assist to get him out of the house.  Pt states that he can't walk due to weakness more than pain.  No reports of fevers, cough, abdominal pain, dysuria.  He has a hx/o throat cancer s/p radiation and is currently on tube feeds (for last three years).     Past Medical History  Diagnosis Date  . Blood transfusion     2006 had 4 units  . Arthritis     knees,back,shoulders  . Anxiety   . Right ureteral stone S/P URETERAL STENT 01-24-11  AND ESWL  03-01-11  . History of acute renal failure 01-23-11    DUE TO HYDRONEPHROSIS AND RIGHT STONE  . Normal cardiac stress test 09-25-2007  . Glaucoma   . Cataract immature   . Diverticulosis of colon   . History of GI diverticular bleed   . Hyperlipemia   . Impaired hearing NOT WEARING HIS BILATERL AIDS  . Urgency of urination     DUE TO URETERAL STONE AND STENT  . Frequency of urination   . Insomnia   . Anemia   . Chronic kidney disease     kidney stones  . GERD (gastroesophageal reflux disease)   . Hernia     inguinal  .  Headache(784.0)     migraines  . Trigeminal neuralgia   . Hypertension   . History of radiation therapy 06/21/11-08/04/11    tongue/h/n 69.96 gy in 33 fxs  . History of chemotherapy 07/26/11    taxol/carbo completed/stopped 07/26/11  . 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 G tube feedings ([place])   . History of radiation therapy 06/21/11-08/04/11    base of tongue  . Colon polyps   . Diverticulosis   . Male breast cancer ([place]) 1988    S/P RIGHT MASTECTOMY W/ NODE DISSECTION AND CHEMO  . 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 Skin cancer 2002    melanoma- back  . Nausea without vomiting 12/27/2013  . Melanoma of skin ([place]) 12/27/2013  . Prostate cancer screening 02/05/2014  . Elevated PSA 02/06/2014   Past Surgical History  Procedure Laterality Date  . Cystoscopy w/ ureteral stent placement  01/24/2011    Procedure: CYSTOSCOPY WITH RETROGRADE PYELOGRAM/URETERAL STENT PLACEMENT;  Surgeon: Fredricka Bonine, MD;  Location: [facility];  Service: Urology;  Laterality:  Right;  . Left leg surg for fx  AGE 10    patient states left thigh  . Craniectomy suboccipital for exploration / decompression cranial nerves  2003    5TH CRANIAL NERVE DECOMPRESSION  . Simple mastectomy  1988    MALE--- RIGHT BREAST W/ NODE DISSECTION  . Melanoma excision  10 YRS AGO    BACK AREA  . Extracorporeal shock wave lithotripsy  03-01-11    RIGHT  . Ureteroscopy  03/26/2011    Procedure: URETEROSCOPY;  Surgeon: Fredricka Bonine, MD;  Location: [facility];  Service: Urology;  Laterality: Right;  RIGHT URETEROSCOPY, HOLMIUM LASER AND STENT   . Examination under anesthesia  03/26/2011    Procedure: EXAM UNDER ANESTHESIA;  Surgeon: Fredricka Bonine, MD;  Location: [facility];  Service: Urology;  Laterality: N/A;  . Lymph node biopsy  04/21/2011    Procedure: LYMPH NODE BIOPSY;  Surgeon: Beckie Salts, MD;  Location: [facility];  Service: ENT;  Laterality: Right;  .  Gastrostomy tube placement    . Esophagoscopy with dilitation N/A 02/14/2013    Procedure: ESOPHAGOSCOPY WITH DILITATION;  Surgeon: Izora Gala, MD;  Location: [facility];  Service: ENT;  Laterality: N/A;  Attempted  . Direct laryngoscopy  02/14/2013    Procedure: DIRECT LARYNGOSCOPY;  Surgeon: Izora Gala, MD;  Location[facility];  Service: ENT;;  . Esophageal dilation  05/01/13    tread method esophageal dilation - Dr. Fredricka Bonine  . Diagnostic laproscopy  05/03/13    Dr. Margarette Canada at [facility]   Family History  Problem Relation Age of Onset  . Heart disease Mother   . Heart disease Father   . Cancer Sister     "perineum"  . Stomach cancer Paternal Grandmother    Social History  Substance Use Topics  . Smoking status: Former Smoker -- 0.25 packs/day for 30 years    Types: Cigarettes, Cigars    Quit date: 12/20/1980  . Smokeless tobacco: Never Used  . Alcohol Use: No    Review of Systems  All other systems reviewed and are negative.     Allergies  Hydromorphone; Morphine and related; and Codeine  Home Medications   Prior to Admission medications   Medication Sig Start Date End Date Taking? Authorizing Provider  carbamazepine (TEGRETOL) 200 MG tablet Take 1 tablet (200 mg total) by mouth 3 (three) times daily. Patient taking differently: Give 200 mg by tube 3 (three) times daily.  05/07/14  Yes Marletta Lor, MD  escitalopram (LEXAPRO) 5 MG tablet Take 1 tablet (5 mg total) by mouth daily. Patient taking differently: Give 5 mg by tube daily.  01/21/15  Yes Marletta Lor, MD  HYDROcodone-acetaminophen [facility]) 10-325 MG tablet Take 1 tablet by mouth every 8 (eight) hours as needed. Patient taking differently: Give 1 tablet by tube every 8 (eight) hours as needed for moderate pain or severe pain.  01/21/15  Yes Marletta Lor, MD  Nutritional Supplements (FEEDING SUPPLEMENT, JEVITY 1.5 CAL/FIBER,) LIQD 1.5 cans Jevity 1.5 QID with 130 ml  free water before and after each bolus feeding as tolerated via feeding tube. 09/12/13  Yes Heath Lark, MD  omeprazole (PRILOSEC) 40 MG capsule Take 1 capsule (40 mg total) by mouth daily as needed. Patient taking differently: Give 40 mg by tube daily as needed (stomach acid).  12/03/13  Yes Marletta Lor, MD  ondansetron (ZOFRAN-ODT) 8 MG disintegrating tablet Give 8 mg  by tube daily as needed for nausea or vomiting.  12/18/14  Yes Historical Provider, MD  polyethylene glycol (MIRALAX / GLYCOLAX) packet Instill 1 pack daily via tube Patient taking differently: Give 17 g by tube every other day.  07/01/14  Yes Heath Lark, MD  traMADol (ULTRAM) 50 MG tablet Take 1 tablet (50 mg total) by mouth every 8 (eight) hours as needed. Patient taking differently: Give 50 mg by tube every 8 (eight) hours as needed for moderate pain.  12/06/14  Yes Marletta Lor, MD  Travoprost, BAK Free, (TRAVATAMN) 0.004 % SOLN ophthalmic solution Place 1 drop into both eyes at bedtime. 01/27/11  Yes Monika Salk, MD  UNABLE TO FIND Med Name:  Z-Quil liquid by mouth at bedtime to help sleep.   Yes Historical Provider, MD  Glycerin, Adult, 2.1 G SUPP Place one suppository per rectum daily as needed until [facility] Patient not taking: Reported on 05/07/2015 07/01/14   Heath Lark, MD  Lactulose SOLN 15 mLs by Does not apply route 2 (two) times daily. Patient not taking: Reported on 05/07/2015 07/01/14   Heath Lark, MD  ondansetron [facility]) 4 MG/5ML solution Take 10 mLs (8 mg total) by mouth every 8 (eight) hours as needed for nausea or vomiting. Patient not taking: Reported on 06/07/2015 01/21/15   Marletta Lor, MD   BP 185/88 mmHg  Pulse 62  Temp(Src) 98.1 F (36.7 C) (Oral)  Resp 18  SpO2 96% Physical Exam  Constitutional: He appears well-developed and well-nourished.  HENT:  Head: Normocephalic and atraumatic.  Eyes: EOM are normal. Pupils are equal, round, and reactive to light.  Cardiovascular: Normal rate and  regular rhythm.   No murmur heard. Pulmonary/Chest: Effort normal and breath sounds normal. No respiratory distress.  Abdominal: There is no tenderness. There is no rebound and no guarding.  Mildly distended abdomen.  PEG in LUQ.    Musculoskeletal:  2+ DP pulses in BLE.  Mildly TTP over left anterior knee with small amount of local swelling.  Pt has difficulty with flexion of the knee.    Neurological: He is alert.  Disoriented to time, mildly confused.  Generalized weakness.    Skin: Skin is warm and dry.  Psychiatric: He has a normal mood and affect. His behavior is normal.  Nursing note and vitals reviewed.   ED Course  Procedures (including critical care time) Labs Review Labs Reviewed  BASIC METABOLIC PANEL - Abnormal; Notable for the following:    Glucose, Bld 158 (*)    BUN 35 (*)    Calcium 8.8 (*)    GFR calc non Af Amer 56 (*)    All other components within normal limits  CBC WITH DIFFERENTIAL/PLATELET - Abnormal; Notable for the following:    Lymphs Abs 0.6 (*)    All other components within normal limits  URINALYSIS, ROUTINE W REFLEX MICROSCOPIC (NOT AT [facility]) - Abnormal; Notable for the following:    APPearance CLOUDY (*)    Protein, ur 100 (*)    All other components within normal limits  URINE MICROSCOPIC-ADD ON - Abnormal; Notable for the following:    Squamous Epithelial / LPF 0-5 (*)    Bacteria, UA MANY (*)    All other components within normal limits  URINE CULTURE    Imaging Review Dg Chest 2 View  06/07/2015  CLINICAL DATA:  Fall yesterday EXAM: CHEST  2 VIEW COMPARISON:  05/17/2013 FINDINGS: Cardiac shadow is within normal limits. The lungs are well aerated  bilaterally. No acute bony abnormality is seen. IMPRESSION: No active cardiopulmonary disease. Electronically Signed   By: Inez Catalina M.D.   On: 06/07/2015 17:26   Ct Head Wo Contrast  06/07/2015  CLINICAL DATA:  Patient fell and struck forehead on cabinet today, hematoma along the right  forehead. Multiple falls. EXAM: CT HEAD WITHOUT CONTRAST CT CERVICAL SPINE WITHOUT CONTRAST TECHNIQUE: Multidetector CT imaging of the head and cervical spine was performed following the standard protocol without intravenous contrast. Multiplanar CT image reconstructions of the cervical spine were also generated. COMPARISON:  06/22/2013 and 10/07/2007 FINDINGS: CT HEAD FINDINGS Small right occipital craniectomy, stable. The brainstem, cerebellum, cerebral peduncles, thalami, basal ganglia, basilar cisterns, and ventricular system appear within normal limits. Periventricular white matter and corona radiata hypodensities favor chronic ischemic microvascular white matter disease. No intracranial hemorrhage, mass lesion, or acute CVA. Minimal right forehead scalp soft tissue swelling. Mild chronic right maxillary sinusitis. CT CERVICAL SPINE FINDINGS There is straightening of the normal cervical lordosis. Uncinate spurring causes osseous foraminal narrowing bilaterally at C5-6 and C6-7. There is mild loss of disc height at C5-6. Minimal ossification of the posterior longitudinal ligament at the C4-5 level. The prevertebral soft tissues are somewhat thickened but this appears to be chronic and possibly due to prior radiation therapy in this region. No fracture observed. Peripheral anterior opacity at the lung apices anteriorly is chronic and stable, attributable to prior radiation therapy. IMPRESSION: 1. No acute intracranial findings are acute cervical spine findings. 2. Periventricular white matter and corona radiata hypodensities favor chronic ischemic microvascular white matter disease. 3. Small right occipital craniectomy, stable. 4. Mild chronic right maxillary sinusitis. 5. Uncinate spurring causes mild bilateral foraminal stenosis at C5-6 and C6-7. 6. Radiation therapy related findings in the lung apices. Prevertebral mild soft tissue thickening is chronic and also probably due to prior therapy. Electronically  Signed   By: Van Clines M.D.   On: 06/07/2015 17:37   Ct Cervical Spine Wo Contrast  06/07/2015  CLINICAL DATA:  Patient fell and struck forehead on cabinet today, hematoma along the right forehead. Multiple falls. EXAM: CT HEAD WITHOUT CONTRAST CT CERVICAL SPINE WITHOUT CONTRAST TECHNIQUE: Multidetector CT imaging of the head and cervical spine was performed following the standard protocol without intravenous contrast. Multiplanar CT image reconstructions of the cervical spine were also generated. COMPARISON:  06/22/2013 and 10/07/2007 FINDINGS: CT HEAD FINDINGS Small right occipital craniectomy, stable. The brainstem, cerebellum, cerebral peduncles, thalami, basal ganglia, basilar cisterns, and ventricular system appear within normal limits. Periventricular white matter and corona radiata hypodensities favor chronic ischemic microvascular white matter disease. No intracranial hemorrhage, mass lesion, or acute CVA. Minimal right forehead scalp soft tissue swelling. Mild chronic right maxillary sinusitis. CT CERVICAL SPINE FINDINGS There is straightening of the normal cervical lordosis. Uncinate spurring causes osseous foraminal narrowing bilaterally at C5-6 and C6-7. There is mild loss of disc height at C5-6. Minimal ossification of the posterior longitudinal ligament at the C4-5 level. The prevertebral soft tissues are somewhat thickened but this appears to be chronic and possibly due to prior radiation therapy in this region. No fracture observed. Peripheral anterior opacity at the lung apices anteriorly is chronic and stable, attributable to prior radiation therapy. IMPRESSION: 1. No acute intracranial findings are acute cervical spine findings. 2. Periventricular white matter and corona radiata hypodensities favor chronic ischemic microvascular white matter disease. 3. Small right occipital craniectomy, stable. 4. Mild chronic right maxillary sinusitis. 5. Uncinate spurring causes mild bilateral  foraminal stenosis at  C5-6 and C6-7. 6. Radiation therapy related findings in the lung apices. Prevertebral mild soft tissue thickening is chronic and also probably due to prior therapy. Electronically Signed   By: Van Clines M.D.   On: 06/07/2015 17:37   Dg Knee Complete 4 Views Left  06/07/2015  CLINICAL DATA:  Pain following fall 1 day prior EXAM: LEFT KNEE - COMPLETE 4+ VIEW COMPARISON:  None. FINDINGS: Frontal, lateral, and bilateral oblique views were obtained. There is no acute fracture or dislocation. There is a small joint effusion. There is no appreciable joint space narrowing. No erosive change. There is calcification medial to the distal medial femoral condyle. IMPRESSION: Calcification medial to the distal medial femoral condyle. Probable focus of calcific tendinosis in the superior aspect of the medial collateral ligament. There is a small joint effusion. No fracture or dislocation. No appreciable joint space narrowing. Electronically Signed   By: Lowella Grip III M.D.   On: 06/07/2015 15:03   I have personally reviewed and evaluated these images and lab results as part of my medical decision-making.   EKG Interpretation None      MDM   Final diagnoses:  Fall, initial encounter  Knee contusion, left, initial encounter    Patient here for evaluation of injuries following a fall and his knee off and going out from him. He is able to ambulate in the department. No evidence of acute fracture on imaging. He has a knee sleeve from home that fit well. Discussed with patient and family outpatient orthopedics follow-up for knee instability. PT and home health ordered for increased assistance at home given patient is a fall risk. Discussed home care and close return precautions for any new or worrisome symptoms.    Quintella Reichert, MD 06/08/15 [phone]

## 2015-06-09 ENCOUNTER — Telehealth: Payer: Self-pay | Admitting: *Deleted

## 2015-06-09 LAB — URINE CULTURE: Culture: NO GROWTH

## 2015-06-10 NOTE — Care Management Note (Addendum)
Case Management Note  Patient Details  Name: NATAS MUSCARI MRN: [id number] Date of Birth: 02-17-1928  Subjective/Objective:   multiple falls                  Action/Plan: Discharge Planning: AVS reviewed:  NCM contacted pt via phone, spoke to wife Antonios Tintle. Wife states pt is not able to talk on the phone. Wanted to cancel wheelchair order. States she did not want HH PT/RN. States he had in the past, they came out but pt did not continue to exercises that were provided. States she really needs an aide to come out. States she has list of private duty agency provided to her previously by NCM. And plans to follow up and knows it will be an out of pocket expense. NCM inquired about VA benefits or Long Term Care policy. Wife states Pt is established with the [facility]. States she does recall [facility] discussing with them at a previous meeting he qualifies for an aide. NCM encouraged her to follow up with [facility] for that benefit. And also the [facility] can assist with any DME needed.   PCP Marletta Lor MD   Expected Discharge Date:  06/07/2015              Expected Discharge Plan:  Home/Self Care  In-House Referral:  NA  Discharge planning Services  CM Consult  Post Acute Care Choice:  NA Choice offered to:  NA  DME Arranged:  N/A DME Agency:  NA  HH Arranged:  NA HH Agency:  NA  Status of Service:  Completed, signed off  Medicare Important Message Given:    Date Medicare IM Given:    Medicare IM give by:    Date Additional Medicare IM Given:    Additional Medicare Important Message give by:     If discussed at [place] of Stay Meetings, dates discussed:    Additional Comments:  Erenest Rasher, RN 06/10/2015, 10:17 AM

## 2015-06-23 ENCOUNTER — Telehealth: Payer: Self-pay | Admitting: Internal Medicine

## 2015-06-23 MED ORDER — ONDANSETRON 8 MG PO TBDP: 8.0000 mg | ORAL_TABLET | Freq: Every day | ORAL | Status: DC | PRN

## 2015-06-23 NOTE — Telephone Encounter (Signed)
Left message on voicemail Rx sent to pharmacy.

## 2015-06-23 NOTE — Telephone Encounter (Signed)
ok 

## 2015-06-23 NOTE — Telephone Encounter (Signed)
Okay to refill Zofran 8 mg?

## 2015-06-23 NOTE — Telephone Encounter (Signed)
Pt needs new rx ondansetron 8 mg #30w.refills send to [facility]

## 2015-06-30 ENCOUNTER — Ambulatory Visit (HOSPITAL_COMMUNITY)
Admission: RE | Admit: 2015-06-30 | Discharge: 2015-06-30 | Disposition: A | Discharge status: Home / Self Care | Payer: Medicare Other | Source: Ambulatory Visit | Attending: Interventional Radiology | Admitting: Interventional Radiology

## 2015-06-30 ENCOUNTER — Other Ambulatory Visit (HOSPITAL_COMMUNITY): Payer: Self-pay | Admitting: Interventional Radiology

## 2015-06-30 DIAGNOSIS — Z931 Gastrostomy status: Secondary | ICD-10-CM

## 2015-06-30 DIAGNOSIS — K9423 Gastrostomy malfunction: Secondary | ICD-10-CM | POA: Insufficient documentation

## 2015-06-30 DIAGNOSIS — Y833 Surgical operation with formation of external stoma as the cause of abnormal reaction of the patient, or of later complication, without mention of misadventure at the time of the procedure: Secondary | ICD-10-CM | POA: Insufficient documentation

## 2015-07-30 ENCOUNTER — Ambulatory Visit
Admission: RE | Admit: 2015-07-30 | Discharge: 2015-07-30 | Disposition: A | Discharge status: Home / Self Care | Payer: Medicare Other | Source: Ambulatory Visit | Attending: Radiation Oncology | Admitting: Radiation Oncology

## 2015-07-30 ENCOUNTER — Encounter: Payer: Self-pay | Admitting: Radiation Oncology

## 2015-07-30 VITALS — BP 183/71 | HR 64 | Temp 98.7°F | Resp 20 | Ht 70.0 in

## 2015-07-30 DIAGNOSIS — Z9011 Acquired absence of right breast and nipple: Secondary | ICD-10-CM | POA: Diagnosis not present

## 2015-07-30 DIAGNOSIS — Z87891 Personal history of nicotine dependence: Secondary | ICD-10-CM | POA: Diagnosis not present

## 2015-07-30 DIAGNOSIS — Z931 Gastrostomy status: Secondary | ICD-10-CM | POA: Insufficient documentation

## 2015-07-30 DIAGNOSIS — F419 Anxiety disorder, unspecified: Secondary | ICD-10-CM | POA: Insufficient documentation

## 2015-07-30 DIAGNOSIS — R350 Frequency of micturition: Secondary | ICD-10-CM | POA: Diagnosis not present

## 2015-07-30 DIAGNOSIS — Z85828 Personal history of other malignant neoplasm of skin: Secondary | ICD-10-CM | POA: Insufficient documentation

## 2015-07-30 DIAGNOSIS — C01 Malignant neoplasm of base of tongue: Secondary | ICD-10-CM | POA: Diagnosis not present

## 2015-07-30 DIAGNOSIS — N179 Acute kidney failure, unspecified: Secondary | ICD-10-CM | POA: Insufficient documentation

## 2015-07-30 DIAGNOSIS — K219 Gastro-esophageal reflux disease without esophagitis: Secondary | ICD-10-CM | POA: Diagnosis not present

## 2015-07-30 DIAGNOSIS — H9190 Unspecified hearing loss, unspecified ear: Secondary | ICD-10-CM | POA: Insufficient documentation

## 2015-07-30 DIAGNOSIS — I129 Hypertensive chronic kidney disease with stage 1 through stage 4 chronic kidney disease, or unspecified chronic kidney disease: Secondary | ICD-10-CM | POA: Diagnosis not present

## 2015-07-30 DIAGNOSIS — M1990 Unspecified osteoarthritis, unspecified site: Secondary | ICD-10-CM | POA: Diagnosis not present

## 2015-07-30 DIAGNOSIS — C109 Malignant neoplasm of oropharynx, unspecified: Secondary | ICD-10-CM

## 2015-07-30 DIAGNOSIS — Z87442 Personal history of urinary calculi: Secondary | ICD-10-CM | POA: Insufficient documentation

## 2015-07-30 DIAGNOSIS — K5791 Diverticulosis of intestine, part unspecified, without perforation or abscess with bleeding: Secondary | ICD-10-CM | POA: Insufficient documentation

## 2015-07-30 DIAGNOSIS — N189 Chronic kidney disease, unspecified: Secondary | ICD-10-CM | POA: Insufficient documentation

## 2015-07-30 DIAGNOSIS — E785 Hyperlipidemia, unspecified: Secondary | ICD-10-CM | POA: Diagnosis not present

## 2015-07-30 DIAGNOSIS — Z86010 Personal history of colonic polyps: Secondary | ICD-10-CM | POA: Insufficient documentation

## 2015-07-30 DIAGNOSIS — Z85810 Personal history of malignant neoplasm of tongue: Secondary | ICD-10-CM

## 2015-07-30 MED ORDER — LARYNGOSCOPY SOLUTION RAD-ONC
15.0000 mL | Freq: Once | TOPICAL | Status: AC
  Administered 2015-07-30: 15 mL via TOPICAL
  Filled 2015-07-30: qty 15

## 2015-07-30 NOTE — Progress Notes (Signed)
Radiation Oncology         [phone] ________________________________  Name: Jose [name] MRN: [id number]  Date: 07/30/2015  DOB: 26-Apr-1927  Follow-Up Visit Note  CC: Jose Cowden, MD  Marletta Lor, MD  Diagnosis: Stage T4N2CM0, Squamous cell carcinoma of the base of tongue.  Interval Since Last Radiation:  4 years; 06/21/11-08/04/11  Narrative: [name] is a pleasant 82 y.o. gentleman diagnosed with squamous cell carcinoma of the base of tongue who completed weekly Taxol and carboplatin, with concurrent radiotherapy which he completed in 2013.  He has since been NED. He comes today for follow up. Of note when he was here in our office in February, unfortunately the laryngoscope was not available, and he comes today for follow-up.  On review of systems, the patient continues to have copious amounts of sputum, and is still feeding tube dependent. He has fallen since his last visit however did not sustain any serious injuries. He complained of depression regarding his loss of the ability to eat, and has been referred to counseling however did not pursue this. Today rather he reports that he is doing quite well, but his wife is still very concerned that she does not have good resources for his care long-term. A complete review of systems is obtained and is otherwise negative  Past Medical History:  Past Medical History  Diagnosis Date  . Blood transfusion     2006 had 4 units  . Arthritis     knees,back,shoulders  . Anxiety   . Right ureteral stone S/P URETERAL STENT 01-24-11  AND ESWL  03-01-11  . History of acute renal failure 01-23-11    DUE TO HYDRONEPHROSIS AND RIGHT STONE  . Normal cardiac stress test 09-25-2007  . Glaucoma   . Cataract immature   . Diverticulosis of colon   . History of GI diverticular bleed   . Hyperlipemia   . Impaired hearing NOT WEARING HIS BILATERL AIDS  . Urgency of urination     DUE TO URETERAL STONE AND STENT  .  Frequency of urination   . Insomnia   . Anemia   . Chronic kidney disease     kidney stones  . GERD (gastroesophageal reflux disease)   . Hernia     inguinal  . Headache(784.0)     migraines  . Trigeminal neuralgia   . Hypertension   . History of radiation therapy 06/21/11-08/04/11    tongue/h/n 69.96 gy in 33 fxs  . History of chemotherapy 07/26/11    taxol/carbo completed/stopped 07/26/11  . 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 G tube feedings ([place])   . History of radiation therapy 06/21/11-08/04/11    base of tongue  . Colon polyps   . Diverticulosis   . Male breast cancer ([place]) 1988    S/P RIGHT MASTECTOMY W/ NODE DISSECTION AND CHEMO  . Cancer of base of tongue ([place]) 04/21/11    cT4 N2c M0   . Skin cancer 2002    melanoma- back  . Nausea without vomiting 12/27/2013  . Melanoma of skin ([place]) 12/27/2013  . Prostate cancer screening 02/05/2014  . Elevated PSA 02/06/2014    Past Surgical History: Past Surgical History  Procedure Laterality Date  . Cystoscopy w/ ureteral stent placement  01/24/2011    Procedure: CYSTOSCOPY WITH RETROGRADE PYELOGRAM/URETERAL STENT PLACEMENT;  Surgeon: Fredricka Bonine, MD;  Location: [facility];  Service: Urology;  Laterality: Right;  . Left leg surg for fx  AGE 85  patient states left thigh  . Craniectomy suboccipital for exploration / decompression cranial nerves  2003    5TH CRANIAL NERVE DECOMPRESSION  . Simple mastectomy  1988    MALE--- RIGHT BREAST W/ NODE DISSECTION  . Melanoma excision  10 YRS AGO    BACK AREA  . Extracorporeal shock wave lithotripsy  03-01-11    RIGHT  . Ureteroscopy  03/26/2011    Procedure: URETEROSCOPY;  Surgeon: Fredricka Bonine, MD;  [facility];  Service: Urology;  Laterality: Right;  RIGHT URETEROSCOPY, HOLMIUM LASER AND STENT   . Examination under anesthesia  03/26/2011    Procedure: EXAM UNDER ANESTHESIA;  Surgeon: Fredricka Bonine, MD;  Location: [facility];   Service: Urology;  Laterality: N/A;  . Lymph node biopsy  04/21/2011    Procedure: LYMPH NODE BIOPSY;  Surgeon: Beckie Salts, MD;  Location: [place];  Service: ENT;  Laterality: Right;  . Gastrostomy tube placement    . Esophagoscopy with dilitation N/A 02/14/2013    Procedure: ESOPHAGOSCOPY WITH DILITATION;  Surgeon: Izora Gala, MD;  Location: [place];  Service: ENT;  Laterality: N/A;  Attempted  . Direct laryngoscopy  02/14/2013    Procedure: DIRECT LARYNGOSCOPY;  Surgeon: Izora Gala, MD;  [facility];  Service: ENT;;  . Esophageal dilation  05/01/13    tread method esophageal dilation - Dr. Fredricka Bonine  . Diagnostic laproscopy  05/03/13    Dr. Margarette Canada at [place] History:  Social History   Social History  . Marital Status: Married    Spouse Name: N/A  . Number of Children: 2  . Years of Education: N/A   Occupational History  . retired    Social History Main Topics  . Smoking status: Former Smoker -- 0.25 packs/day for 30 years    Types: Cigarettes, Cigars    Quit date: 12/20/1980  . Smokeless tobacco: Never Used  . Alcohol Use: No  . Drug Use: No  . Sexual Activity: Yes   Other Topics Concern  . Not on file   Social History Narrative  He is married and accompanied by his wife today.  Family History: Family History  Problem Relation Age of Onset  . Heart disease Mother   . Heart disease Father   . Cancer Sister     "perineum"  . Stomach cancer Paternal Grandmother      ALLERGIES:  is allergic to hydromorphone; morphine and related; and codeine.  Meds: Current Outpatient Prescriptions  Medication Sig Dispense Refill  . carbamazepine (TEGRETOL) 200 MG tablet Take 1 tablet (200 mg total) by mouth 3 (three) times daily. (Patient taking differently: Give 200 mg by tube 3 (three) times daily. ) 90 tablet 1  . escitalopram (LEXAPRO) 5 MG tablet Take 1 tablet (5 mg total) by mouth daily. (Patient taking differently:  Give 5 mg by tube daily. ) 90 tablet 3  . Glycerin, Adult, 2.1 G SUPP Place one suppository per rectum daily as needed until BM 25 suppository 1  . Lactulose SOLN 15 mLs by Does not apply route 2 (two) times daily. 1000 mL 1  . Nutritional Supplements (FEEDING SUPPLEMENT, JEVITY 1.5 CAL/FIBER,) LIQD 1.5 cans Jevity 1.5 QID with 130 ml free water before and after each bolus feeding as tolerated via feeding tube. 1422 mL   . omeprazole (PRILOSEC) 40 MG capsule Take 1 capsule (40 mg total) by mouth daily as needed. (Patient taking differently:  Give 40 mg by tube daily as needed (stomach acid). ) 90 capsule 3  . ondansetron (ZOFRAN) 4 MG/5ML solution Take 10 mLs (8 mg total) by mouth every 8 (eight) hours as needed for nausea or vomiting. 50 mL 6  . ondansetron (ZOFRAN-ODT) 8 MG disintegrating tablet Take 1 tablet (8 mg total) by mouth daily as needed for nausea or vomiting. 20 tablet 6  . polyethylene glycol (MIRALAX / GLYCOLAX) packet Instill 1 pack daily via tube (Patient taking differently: Give 17 g by tube every other day. ) 30 each 1  . traMADol (ULTRAM) 50 MG tablet Take 1 tablet (50 mg total) by mouth every 8 (eight) hours as needed. (Patient taking differently: Give 50 mg by tube every 8 (eight) hours as needed for moderate pain. ) 90 tablet 2  . Travoprost, BAK Free, (TRAVATAMN) 0.004 % SOLN ophthalmic solution Place 1 drop into both eyes at bedtime.    Marland Kitchen UNABLE TO FIND Med Name:  Z-Quil liquid by mouth at bedtime to help sleep.    Marland Kitchen HYDROcodone-acetaminophen (NORCO) 10-325 MG tablet Take 1 tablet by mouth every 8 (eight) hours as needed. (Patient not taking: Reported on 07/30/2015) 30 tablet 0   No current facility-administered medications for this encounter.   Facility-Administered Medications Ordered in Other Encounters  Medication Dose Route Frequency Provider Last Rate Last Dose  . topical emolient (BIAFINE) emulsion   Topical PRN Kyung Rudd, MD        Physical Findings:  height is 5'  10" (1.778 m). His oral temperature is 98.7 F (37.1 C). His blood pressure is 183/71 and his pulse is 64. His respiration is 20 and oxygen saturation is 98%.  In general this is a well appearing caucasian male in no acute distress. He's alert and oriented x4 and appropriate throughout the examination. Cardiopulmonary assessment is negative for acute distress and he exhibits normal effort.  Indirect laryngoscopy is performed. Evaluation of the posterior pharynx, base of tongue, epiglottis, and vocal cords are negative for visible lesions. No lesions seen distally in the trachea. Hypopigmentation change consistent with previous radiation is noted in the tissue without gross lesions.       Lab Findings: Lab Results  Component Value Date   WBC 5.9 06/07/2015   HGB 13.9 06/07/2015   HCT 40.3 06/07/2015   MCV 92.2 06/07/2015   PLT 231 06/07/2015   Radiographic Findings: Ir Gastric Tube Perc Chg W/o Img Guide  06/30/2015  INDICATION: [age]-year-old male who requires a gastrostomy tube for feeding. The cap of his gastrostomy tube has essentially broken off. He is here for replacement of his gastrostomy tube. EXAM: GASTROSTOMY CATHETER REPLACEMENT MEDICATIONS: None ANESTHESIA/SEDATION: None COMPLICATIONS: None immediate. PROCEDURE: Informed written consent was obtained from the patient after a thorough discussion of the procedural risks, benefits and alternatives. All questions were addressed. After informed consent was obtained, the patient's existing 20 French gastrostomy tube balloon was accessed and deflated. Once this was deflated and the tube was removed. Expulsion of gastric contents recurrent. A new 20 French gastrostomy tube was placed in this tract. Once it was in the balloon was inflated. Attention was placed on the gastrostomy tube until resistance was met from the balloon. Once this occurred, the flange was positioned at scan level. Gastric contents were seen in the tube to  confirm placement. Once this was complete, a dressing was placed around the gastrostomy tube. The patient tolerated the procedure well. IMPRESSION: Successful exchange of a 70 [place]  gastrostomy tube. This is ready for immediate use. Read by: Saverio Danker, PA-C Electronically Signed   By: Jacqulynn Cadet M.D.   On: 06/30/2015 16:08    Impression/Plan: 1. Stage T4N2CM0, Squamous cell carcinoma of the base of tongue. The patient continues to be clinically without evidence of disease. We discussed the rationale for moving back in 6 months time for continued evaluation and we'll plan laryngoscopy that time. He states agreement and understanding. 2. Ethics. The patient does have a history of advanced cancer, with comorbidities, advanced age, frailty, deconditioning, and depression about his medical illnesses. We did discuss the rationale for considering the palliative care consult as this may help supplement some of the needs in terms of resources available to him. The patient's wife is very interested in this and the patient also agrees to referral. We will contact hospice and palliative care of [place] to coordinate this if possible at home.   The above documentation reflects my direct findings during this shared patient visit. Please see the separate note by Dr. Lisbeth Renshaw on this date for the remainder of the patient's plan of care.    Carola Rhine, PAC  This document serves as a record of services personally performed by Shona Simpson, PA and Kyung Rudd, MD. It was created on their behalf by Jenell Milliner, a trained medical scribe. The creation of this record is based on the scribe's personal observations and the provider's statements to them. This document has been checked and approved by the attending provider.

## 2015-07-30 NOTE — Progress Notes (Signed)
Follow up s/p rad tx base of tongue 06/21/11-08/04/11, unable to swallow any food/fluids, spits up copious amots clear sputum, has 6 cans a day via peg of Jevity 1.5 cl can, flushes with free water 129ml before and afterwards, unable to stand to get weighed,  , last TSH 01/21/15=3.51, appt with Dr. Dorthula Matas next month, , hasn't seen ENT MD [facility]  For a long time stated, very fatigued, weak, wearing fall risk bracelet right wrist, in w/c 1:10 PM BP 183/71 mmHg  Pulse 64  Temp(Src) 98.7 F (37.1 C) (Oral)  Resp 20  Ht 5\' 10"  (1.778 m)  Wt   SpO2 98%  Wt Readings from Last 3 Encounters:  02/06/15 168 lb 12.8 oz (76.567 kg)  01/21/15 170 lb (77.111 kg)  11/07/14 169 lb 12.8 oz (77.021 kg)

## 2015-08-28 ENCOUNTER — Emergency Department (HOSPITAL_COMMUNITY): Payer: Medicare Other

## 2015-08-28 ENCOUNTER — Encounter (HOSPITAL_COMMUNITY): Payer: Self-pay | Admitting: Emergency Medicine

## 2015-08-28 ENCOUNTER — Telehealth: Payer: Self-pay | Admitting: Internal Medicine

## 2015-08-28 ENCOUNTER — Emergency Department (HOSPITAL_COMMUNITY)
Admission: EM | Admit: 2015-08-28 | Discharge: 2015-08-28 | Disposition: A | Discharge status: Home / Self Care | Payer: Medicare Other | Attending: Emergency Medicine | Admitting: Emergency Medicine

## 2015-08-28 DIAGNOSIS — Z853 Personal history of malignant neoplasm of breast: Secondary | ICD-10-CM | POA: Diagnosis not present

## 2015-08-28 DIAGNOSIS — Z85820 Personal history of malignant melanoma of skin: Secondary | ICD-10-CM | POA: Diagnosis not present

## 2015-08-28 DIAGNOSIS — Z87891 Personal history of nicotine dependence: Secondary | ICD-10-CM | POA: Insufficient documentation

## 2015-08-28 DIAGNOSIS — Z79899 Other long term (current) drug therapy: Secondary | ICD-10-CM | POA: Diagnosis not present

## 2015-08-28 DIAGNOSIS — R002 Palpitations: Secondary | ICD-10-CM | POA: Insufficient documentation

## 2015-08-28 DIAGNOSIS — R4182 Altered mental status, unspecified: Secondary | ICD-10-CM | POA: Diagnosis not present

## 2015-08-28 DIAGNOSIS — Z8589 Personal history of malignant neoplasm of other organs and systems: Secondary | ICD-10-CM | POA: Insufficient documentation

## 2015-08-28 DIAGNOSIS — R531 Weakness: Secondary | ICD-10-CM

## 2015-08-28 DIAGNOSIS — N189 Chronic kidney disease, unspecified: Secondary | ICD-10-CM | POA: Diagnosis not present

## 2015-08-28 DIAGNOSIS — I159 Secondary hypertension, unspecified: Secondary | ICD-10-CM | POA: Insufficient documentation

## 2015-08-28 LAB — COMPREHENSIVE METABOLIC PANEL
ALK PHOS: 82 U/L (ref 38–126)
ALT: 25 U/L (ref 17–63)
AST: 25 U/L (ref 15–41)
Albumin: 3.9 g/dL (ref 3.5–5.0)
Anion gap: 7 (ref 5–15)
BILIRUBIN TOTAL: 0.3 mg/dL (ref 0.3–1.2)
BUN: 36 mg/dL — AB (ref 6–20)
CO2: 29 mmol/L (ref 22–32)
CREATININE: 1.21 mg/dL (ref 0.61–1.24)
Calcium: 9 mg/dL (ref 8.9–10.3)
Chloride: 102 mmol/L (ref 101–111)
GFR calc Af Amer: >60 mL/min (ref >60)
GFR, EST NON AFRICAN AMERICAN: 52 mL/min — AB (ref >60)
Glucose, Bld: 133 mg/dL — ABNORMAL HIGH (ref 65–99)
Potassium: 5 mmol/L (ref 3.5–5.1)
Sodium: 138 mmol/L (ref 135–145)
TOTAL PROTEIN: 6.9 g/dL (ref 6.5–8.1)

## 2015-08-28 LAB — I-STAT CHEM 8, ED
BUN: 37 mg/dL — ABNORMAL HIGH (ref 6–20)
CHLORIDE: 101 mmol/L (ref 101–111)
CREATININE: 1.1 mg/dL (ref 0.61–1.24)
Calcium, Ion: 1.18 mmol/L (ref 1.13–1.30)
Glucose, Bld: 128 mg/dL — ABNORMAL HIGH (ref 65–99)
HEMATOCRIT: 43 % (ref 39.0–52.0)
Hemoglobin: 14.6 g/dL (ref 13.0–17.0)
POTASSIUM: 4.9 mmol/L (ref 3.5–5.1)
Sodium: 139 mmol/L (ref 135–145)
TCO2: 30 mmol/L (ref 0–100)

## 2015-08-28 LAB — URINALYSIS, ROUTINE W REFLEX MICROSCOPIC
Bilirubin Urine: NEGATIVE
Glucose, UA: NEGATIVE mg/dL
Hgb urine dipstick: NEGATIVE
Ketones, ur: NEGATIVE mg/dL
Leukocytes, UA: NEGATIVE
NITRITE: NEGATIVE
PH: 7.5 (ref 5.0–8.0)
Protein, ur: 100 mg/dL — AB
SPECIFIC GRAVITY, URINE: 1.016 (ref 1.005–1.030)

## 2015-08-28 LAB — CBC WITH DIFFERENTIAL/PLATELET
BASOS ABS: 0 10*3/uL (ref 0.0–0.1)
Basophils Relative: 1 %
EOS ABS: 0.1 10*3/uL (ref 0.0–0.7)
EOS PCT: 1 %
HCT: 41.8 % (ref 39.0–52.0)
Hemoglobin: 14.3 g/dL (ref 13.0–17.0)
Lymphocytes Relative: 13 %
Lymphs Abs: 0.8 10*3/uL (ref 0.7–4.0)
MCH: 31 pg (ref 26.0–34.0)
MCHC: 34.2 g/dL (ref 30.0–36.0)
MCV: 90.7 fL (ref 78.0–100.0)
MONO ABS: 0.5 10*3/uL (ref 0.1–1.0)
Monocytes Relative: 9 %
Neutro Abs: 4.7 10*3/uL (ref 1.7–7.7)
Neutrophils Relative %: 76 %
PLATELETS: 228 10*3/uL (ref 150–400)
RBC: 4.61 MIL/uL (ref 4.22–5.81)
RDW: 12.5 % (ref 11.5–15.5)
WBC: 6.1 10*3/uL (ref 4.0–10.5)

## 2015-08-28 LAB — URINE MICROSCOPIC-ADD ON: RBC / HPF: NONE SEEN RBC/hpf (ref 0–5)

## 2015-08-28 LAB — ETHANOL

## 2015-08-28 LAB — I-STAT TROPONIN, ED: TROPONIN I, POC: 0.02 ng/mL (ref 0.00–0.08)

## 2015-08-28 LAB — I-STAT CG4 LACTIC ACID, ED: Lactic Acid, Venous: 1.86 mmol/L (ref 0.5–2.0)

## 2015-08-28 LAB — CBG MONITORING, ED: GLUCOSE-CAPILLARY: 114 mg/dL — AB (ref 65–99)

## 2015-08-28 LAB — AMMONIA: Ammonia: 46 umol/L — ABNORMAL HIGH (ref 9–35)

## 2015-08-28 NOTE — ED Notes (Signed)
Ammonia recollect sent down.

## 2015-08-28 NOTE — ED Notes (Signed)
Bed: WA04 Expected date:  Expected time:  Means of arrival:  Comments: EMS- weakness, hyptertension

## 2015-08-28 NOTE — Telephone Encounter (Signed)
atient Name: Jose [name] DOB: [birth year] Initial Comment caller states husbands BP is 199/99 and heart racing Nurse Assessment Nurse: Vallery Sa, RN, [place] Date/Time (Eastern Time): 08/28/2015 8:35:38 AM Confirm and document reason for call. If symptomatic, describe symptoms. You must click the next button to save text entered. ---Sadie states that [name] developed a very fast heart rate this morning and his blood pressure was 199/96 about 2 hours. No severe breathing difficulty. No injury in the past 3 days. No fever. He is more confused than usual today.  Has the patient traveled out of the country within the last 30 days? ---No Does the patient have any new or worsening symptoms? ---Yes Will a triage be completed? ---Yes Related visit to physician within the last 2 weeks? ---No Does the PT have any chronic conditions? (i.e. diabetes, asthma, etc.) ---Yes List chronic conditions. ---Dementia, Throat Cancer, Chronic pain, high Blood Pressure Is this a behavioral health or substance abuse call? ---No Guidelines Guideline Title Affirmed Question Affirmed Notes Heart Rate and Heartbeat Questions Difficult to awaken or acting confused (e.g., disoriented, slurred speech) Final Disposition User Call EMS 911 Now [place], RN, [employer] declined the Call 911 disposition. Reinforced the Call 911 disposition. She states she went in to work and is going home now to take him to the [employer] . Referrals [facility] - ED Disagree/Comply: Disagree Disagree/Comply Reason: Disagree with instructions

## 2015-08-28 NOTE — Telephone Encounter (Signed)
FYI

## 2015-08-28 NOTE — ED Provider Notes (Signed)
CSN: [id number]     Arrival date & time 08/28/15  [id number] History   First MD Initiated Contact with Patient 08/28/15 1002     Chief Complaint  Patient presents with  . Weakness  . Palpitations     (Consider location/radiation/quality/duration/timing/severity/associated sxs/prior Treatment) Patient is a 82 y.o. male presenting with general illness. The history is provided by the patient. The history is limited by the condition of the patient.  Illness Severity:  Mild Onset quality:  Gradual Duration:  2 days Timing:  Intermittent Progression:  Waxing and waning Chronicity:  New Associated symptoms: no abdominal pain, no chest pain, no congestion, no diarrhea, no fever, no headaches, no myalgias, no rash, no shortness of breath and no vomiting    Little 5 caveat dementia.  82 year old male with waxing and waning mental status. Patient has no complaints.  Patient reassessed with family at bedside. Family is concerned that he has had a symptomatically hypertension for the past week or so. They should be a list of blood pressures with systolics in the [id number]. As I was in the room the blood pressure cuff when off and the patient started fighting the blood pressure cuff. They deny any other specific symptoms.  Past Medical History  Diagnosis Date  . Blood transfusion     2006 had 4 units  . Arthritis     knees,back,shoulders  . Anxiety   . Right ureteral stone S/P URETERAL STENT 01-24-11  AND ESWL  03-01-11  . History of acute renal failure 01-23-11    DUE TO HYDRONEPHROSIS AND RIGHT STONE  . Normal cardiac stress test 09-25-2007  . Glaucoma   . Cataract immature   . Diverticulosis of colon   . History of GI diverticular bleed   . Hyperlipemia   . Impaired hearing NOT WEARING HIS BILATERL AIDS  . Urgency of urination     DUE TO URETERAL STONE AND STENT  . Frequency of urination   . Insomnia   . Anemia   . Chronic kidney disease     kidney stones  . GERD (gastroesophageal reflux  disease)   . Hernia     inguinal  . Headache(784.0)     migraines  . Trigeminal neuralgia   . Hypertension   . History of radiation therapy 06/21/11-08/04/11    tongue/h/n 69.96 gy in 33 fxs  . History of chemotherapy 07/26/11    taxol/carbo completed/stopped 07/26/11  . 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 G tube feedings ([place])   . History of radiation therapy 06/21/11-08/04/11    base of tongue  . Colon polyps   . Diverticulosis   . Nausea without vomiting 12/27/2013  . Prostate cancer screening 02/05/2014  . Elevated PSA 02/06/2014  . Male breast cancer ([place]) 1988    S/P RIGHT MASTECTOMY W/ NODE DISSECTION AND CHEMO  . 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 Skin cancer 2002    melanoma- back  . Melanoma of skin ([place]) 12/27/2013   Past Surgical History  Procedure Laterality Date  . Cystoscopy w/ ureteral stent placement  01/24/2011    Procedure: CYSTOSCOPY WITH RETROGRADE PYELOGRAM/URETERAL STENT PLACEMENT;  Surgeon: Fredricka Bonine, MD;  Location: [facility];  Service: Urology;  Laterality: Right;  . Left leg surg for fx  AGE 30    patient states left thigh  . Craniectomy suboccipital for exploration / decompression cranial nerves  2003    5TH CRANIAL NERVE DECOMPRESSION  .  Simple mastectomy  1988    MALE--- RIGHT BREAST W/ NODE DISSECTION  . Melanoma excision  10 YRS AGO    BACK AREA  . Extracorporeal shock wave lithotripsy  03-01-11    RIGHT  . Ureteroscopy  03/26/2011    Procedure: URETEROSCOPY;  Surgeon: Fredricka Bonine, MD;  Location: [facility];  Service: Urology;  Laterality: Right;  RIGHT URETEROSCOPY, HOLMIUM LASER AND STENT   . Examination under anesthesia  03/26/2011    Procedure: EXAM UNDER ANESTHESIA;  Surgeon: Fredricka Bonine, MD;  Location: [facility];  Service: Urology;  Laterality: N/A;  . Lymph node biopsy  04/21/2011    Procedure: LYMPH NODE BIOPSY;  Surgeon: Beckie Salts, MD;  Location: [facility];   Service: ENT;  Laterality: Right;  . Gastrostomy tube placement    . Esophagoscopy with dilitation N/A 02/14/2013    Procedure: ESOPHAGOSCOPY WITH DILITATION;  Surgeon: Izora Gala, MD;  Location: [facility];  Service: ENT;  Laterality: N/A;  Attempted  . Direct laryngoscopy  02/14/2013    Procedure: DIRECT LARYNGOSCOPY;  Surgeon: Izora Gala, MD;  Location: [facility];  Service: ENT;;  . Esophageal dilation  05/01/13    tread method esophageal dilation - Dr. Fredricka Bonine  . Diagnostic laproscopy  05/03/13    Dr. Margarette Canada at [place]   Family History  Problem Relation Age of Onset  . Heart disease Mother   . Heart disease Father   . Cancer Sister     "perineum"  . Stomach cancer Paternal Grandmother    Social History  Substance Use Topics  . Smoking status: Former Smoker -- 0.25 packs/day for 30 years    Types: Cigarettes, Cigars    Quit date: 12/20/1980  . Smokeless tobacco: Never Used  . Alcohol Use: No    Review of Systems  Constitutional: Negative for fever and chills.  HENT: Negative for congestion and facial swelling.   Eyes: Negative for discharge and visual disturbance.  Respiratory: Negative for shortness of breath.   Cardiovascular: Negative for chest pain and palpitations.  Gastrointestinal: Negative for vomiting, abdominal pain and diarrhea.  Musculoskeletal: Negative for myalgias and arthralgias.  Skin: Negative for color change and rash.  Neurological: Negative for tremors, syncope and headaches.  Psychiatric/Behavioral: Negative for confusion and dysphoric mood.      Allergies  Hydromorphone; Morphine and related; and Codeine  Home Medications   Prior to Admission medications   Medication Sig Start Date End Date Taking? Authorizing Provider  amLODipine (NORVASC) 2.5 MG tablet 2.5 mg by PEG Tube route daily.   Yes Historical Provider, MD  carbamazepine (TEGRETOL) 200 MG tablet Take 1 tablet (200 mg total) by mouth 3 (three) times  daily. Patient taking differently: Give 200 mg by tube 3 (three) times daily.  05/07/14  Yes Marletta Lor, MD  ENSURE (ENSURE) 88 mLs by PEG Tube route 4 (four) times daily.    Yes Historical Provider, MD  escitalopram (LEXAPRO) 10 MG tablet 5 mg by PEG Tube route daily.    Yes Historical Provider, MD  HYDROcodone-acetaminophen (NORCO) 10-325 MG tablet Take 1 tablet by mouth every 8 (eight) hours as needed. Patient taking differently: 1 tablet by PEG Tube route every 8 (eight) hours as needed for moderate pain.  01/21/15  Yes Marletta Lor, MD  Nutritional Supplements (ISOSOURCE 1.5 CAL) LIQD 1.5 Cans by PEG Tube route 4 (four) times daily. Pt gets 1 and a half can qid  Yes Historical Provider, MD  omeprazole (PRILOSEC) 20 MG capsule 20 mg by PEG Tube route 2 (two) times daily before a meal.    Yes Historical Provider, MD  ondansetron (ZOFRAN-ODT) 8 MG disintegrating tablet Take 1 tablet (8 mg total) by mouth daily as needed for nausea or vomiting. Patient taking differently: Place 8 mg into feeding tube daily as needed for nausea or vomiting.  06/23/15  Yes Marletta Lor, MD  polyethylene glycol [facility] / Floria Raveling) packet Instill 1 pack daily via tube Patient taking differently: Give 17 g by tube every other day.  07/01/14  Yes Heath Lark, MD  traMADol (ULTRAM) 50 MG tablet Take 1 tablet (50 mg total) by mouth every 8 (eight) hours as needed. Patient taking differently: Give 50 mg by tube every 8 (eight) hours as needed for moderate pain.  12/06/14  Yes Marletta Lor, MD  Travoprost, BAK Free, (TRAVATAMN) 0.004 % SOLN ophthalmic solution Place 1 drop into both eyes at bedtime. 01/27/11  Yes Monika Salk, MD  UNABLE TO FIND Med Name:  Z-Quil liquid by peg tube at bedtime to help sleep.   Yes Historical Provider, MD  escitalopram (LEXAPRO) 5 MG tablet Take 1 tablet (5 mg total) by mouth daily. Patient not taking: Reported on 08/28/2015 01/21/15   Marletta Lor, MD  Glycerin,  Adult, 2.1 G SUPP Place one suppository per rectum daily as needed until [facility] Patient not taking: Reported on 08/28/2015 07/01/14   Heath Lark, MD  Lactulose SOLN 15 mLs by Does not apply route 2 (two) times daily. Patient not taking: Reported on 08/28/2015 07/01/14   Heath Lark, MD  Nutritional Supplements (FEEDING SUPPLEMENT, JEVITY 1.5 CAL/FIBER,) LIQD 1.5 cans Jevity 1.5 QID with 130 ml free water before and after each bolus feeding as tolerated via feeding tube. Patient not taking: Reported on 08/28/2015 09/12/13   Heath Lark, MD  omeprazole (PRILOSEC) 40 MG capsule Take 1 capsule (40 mg total) by mouth daily as needed. Patient not taking: Reported on 08/28/2015 12/03/13   Marletta Lor, MD   BP 174/145 mmHg  Pulse 59  Temp(Src) 98.6 F (37 C) (Oral)  Resp 16  SpO2 97% Physical Exam  Constitutional: He is oriented to person, place, and time. He appears well-developed and well-nourished.  HENT:  Head: Normocephalic and atraumatic.  Eyes: EOM are normal. Pupils are equal, round, and reactive to light.  Neck: Normal range of motion. Neck supple. No JVD present.  Cardiovascular: Normal rate and regular rhythm.  Exam reveals no gallop and no friction rub.   No murmur heard. Pulmonary/Chest: No respiratory distress. He has no wheezes.  Abdominal: He exhibits no distension. There is no rebound and no guarding.  Musculoskeletal: Normal range of motion.  Neurological: He is alert and oriented to person, place, and time.  Skin: No rash noted. No pallor.  Psychiatric: He has a normal mood and affect. His behavior is normal.  Nursing note and vitals reviewed.   ED Course  Procedures (including critical care time) Labs Review Labs Reviewed  COMPREHENSIVE METABOLIC PANEL - Abnormal; Notable for the following:    Glucose, Bld 133 (*)    BUN 36 (*)    GFR calc non Af Amer 52 (*)    All other components within normal limits  URINALYSIS, ROUTINE W REFLEX MICROSCOPIC (NOT AT [facility]) - Abnormal;  Notable for the following:    Protein, ur 100 (*)    All other components within normal limits  AMMONIA -  Abnormal; Notable for the following:    Ammonia 46 (*)    All other components within normal limits  URINE MICROSCOPIC-ADD ON - Abnormal; Notable for the following:    Squamous Epithelial / LPF 0-5 (*)    Bacteria, UA RARE (*)    Casts HYALINE CASTS (*)    All other components within normal limits  I-STAT CHEM 8, ED - Abnormal; Notable for the following:    BUN 37 (*)    Glucose, Bld 128 (*)    All other components within normal limits  CBG MONITORING, ED - Abnormal; Notable for the following:    Glucose-Capillary 114 (*)    All other components within normal limits  URINE CULTURE  CBC WITH DIFFERENTIAL/PLATELET  ETHANOL  I-STAT CG4 LACTIC ACID, ED  Randolm Idol, ED    Imaging Review Dg Chest 2 View  08/28/2015  CLINICAL DATA:  Generalized weakness for 3 weeks EXAM: CHEST  2 VIEW COMPARISON:  06/07/2015 FINDINGS: Cardiomediastinal silhouette is stable. No acute infiltrate or pleural effusion. No pulmonary edema. Surgical clips in right axilla again noted. IMPRESSION: No active cardiopulmonary disease. Electronically Signed   By: Lahoma Crocker M.D.   On: 08/28/2015 10:56   Ct Head Wo Contrast  08/28/2015  CLINICAL DATA:  Generalized weakness for the past 3 weeks. Intermittent confusion. EXAM: CT HEAD WITHOUT CONTRAST TECHNIQUE: Contiguous axial images were obtained from the base of the skull through the vertex without intravenous contrast. COMPARISON:  06/07/2015; 06/22/2013 FINDINGS: Brain: Similar findings of atrophy with sulcal prominence and centralized volume loss with commensurate ex vacuo dilatation of the ventricular system. There is unchanged prominence of the extra axial space about the left cerebellar hemisphere. Grossly unchanged scattered periventricular hypodensities compatible microvascular ischemic disease, most conspicuous about the occipital horn of the left lateral  ventricle. Given background parenchymal abnormalities, there is no CT evidence of superimposed acute large territory infarct. No intraparenchymal or extra-axial mass or hemorrhage. Unchanged size and configuration of the ventricles and basilar cisterns. No midline shift. Vascular: No hyperdense vessel or unexpected calcification. Skull: Negative for fracture or focal lesion. Sinuses/Orbits: Limited visualization the paranasal sinuses and mastoid air cells is normal. No air-fluid levels. Post bilateral cataract surgery. Other: Regional soft tissues appear normal. IMPRESSION: Similar findings of atrophy and microvascular ischemic disease without superimposed acute intracranial process. Electronically Signed   By: Sandi Mariscal M.D.   On: 08/28/2015 10:49   I have personally reviewed and evaluated these images and lab results as part of my medical decision-making.   EKG Interpretation   Date/Time:  Thursday August 28 2015 10:17:32 EDT Ventricular Rate:  64 PR Interval:  67 QRS Duration: 91 QT Interval:  388 QTC Calculation: 400 R Axis:   -55 Text Interpretation:  Sinus rhythm Short PR interval Left anterior  fascicular block No significant change since last tracing Confirmed by  Merida Alcantar MD, Quillian Quince [id number]) on 08/28/2015 10:25:07 AM Also confirmed by Tyrone Nine  MD, DANIEL [phone]), editor Stout CT, Leda Gauze [phone])  on 08/28/2015 10:30:58  AM      MDM   Final diagnoses:  Weakness  Secondary hypertension, unspecified    82 yo M with a chief complaint of altered mental status. No complaint of my exam. Skin warm to touch. Will obtain infectious and altered mental status workup.  Workup with no significant findings. Love the patient follow-up with his family doctor for a asymptomatically hypertension.    2:12 PM:  I have discussed the diagnosis/risks/treatment options with the patient and  family and believe the pt to be eligible for discharge home to follow-up with PCP. We also discussed returning to the ED  immediately if new or worsening sx occur. We discussed the sx which are most concerning (e.g., sudden worsening pain, fever, inability to tolerate by mouth) that necessitate immediate return. Medications administered to the patient during their visit and any new prescriptions provided to the patient are listed below.  Medications given during this visit Medications - No data to display  Discharge Medication List as of 08/28/2015 12:23 PM      The patient appears reasonably screen and/or stabilized for discharge and I doubt any other medical condition or other [facility] requiring further screening, evaluation, or treatment in the ED at this time prior to discharge.    Deno Etienne, DO 08/28/15 1412

## 2015-08-28 NOTE — ED Notes (Signed)
Per EMS. Pt's wife told EMS he has been having generalized weakness for the past 3 weeks accompanied by intermittent confusion. Had palpitations an hour prior to EMS arrival. NSR with EMS. Pt also complains of HTN, BP 190/100 with EMS. EMS reported pt was A+O. However, pt disoriented to place and time.

## 2015-08-28 NOTE — Telephone Encounter (Signed)
Pt currently in ED.

## 2015-08-28 NOTE — Discharge Instructions (Signed)

## 2015-08-28 NOTE — ED Notes (Signed)
MD at bedside. 

## 2015-08-29 LAB — URINE CULTURE: Culture: 2000 — AB

## 2015-09-25 ENCOUNTER — Other Ambulatory Visit: Payer: Self-pay

## 2015-09-25 ENCOUNTER — Telehealth: Payer: Self-pay | Admitting: *Deleted

## 2015-09-25 MED ORDER — TRAMADOL HCL 50 MG PO TABS: 50.0000 mg | ORAL_TABLET | Freq: Three times a day (TID) | ORAL | Status: DC | PRN

## 2015-09-25 NOTE — Telephone Encounter (Signed)
Error

## 2015-09-25 NOTE — Telephone Encounter (Signed)
Last refill 12/06/2014 #90, 2 rf Last OV 01/21/2015  No pending scheduled Please advise on refill

## 2015-10-06 ENCOUNTER — Other Ambulatory Visit (HOSPITAL_BASED_OUTPATIENT_CLINIC_OR_DEPARTMENT_OTHER): Payer: Medicare Other

## 2015-10-06 ENCOUNTER — Encounter: Payer: Self-pay | Admitting: Hematology and Oncology

## 2015-10-06 ENCOUNTER — Ambulatory Visit (HOSPITAL_BASED_OUTPATIENT_CLINIC_OR_DEPARTMENT_OTHER): Payer: Medicare Other | Admitting: Hematology and Oncology

## 2015-10-06 VITALS — BP 159/83 | HR 61 | Temp 98.2°F | Resp 18 | Ht 70.0 in | Wt 188.5 lb

## 2015-10-06 DIAGNOSIS — Z931 Gastrostomy status: Secondary | ICD-10-CM | POA: Diagnosis not present

## 2015-10-06 DIAGNOSIS — Z853 Personal history of malignant neoplasm of breast: Secondary | ICD-10-CM | POA: Diagnosis not present

## 2015-10-06 DIAGNOSIS — Z66 Do not resuscitate: Secondary | ICD-10-CM

## 2015-10-06 DIAGNOSIS — R972 Elevated prostate specific antigen [PSA]: Secondary | ICD-10-CM

## 2015-10-06 DIAGNOSIS — Z85810 Personal history of malignant neoplasm of tongue: Secondary | ICD-10-CM

## 2015-10-06 DIAGNOSIS — C01 Malignant neoplasm of base of tongue: Secondary | ICD-10-CM

## 2015-10-06 DIAGNOSIS — F0390 Unspecified dementia without behavioral disturbance: Secondary | ICD-10-CM | POA: Insufficient documentation

## 2015-10-06 DIAGNOSIS — C439 Malignant melanoma of skin, unspecified: Secondary | ICD-10-CM

## 2015-10-06 LAB — COMPREHENSIVE METABOLIC PANEL
ALBUMIN: 3.3 g/dL — AB (ref 3.5–5.0)
ALK PHOS: 92 U/L (ref 40–150)
ALT: 25 U/L (ref 0–55)
ANION GAP: 8 meq/L (ref 3–11)
AST: 19 U/L (ref 5–34)
BILIRUBIN TOTAL: 0.31 mg/dL (ref 0.20–1.20)
BUN: 38.5 mg/dL — AB (ref 7.0–26.0)
CALCIUM: 8.8 mg/dL (ref 8.4–10.4)
CO2: 26 mEq/L (ref 22–29)
CREATININE: 1.3 mg/dL (ref 0.7–1.3)
Chloride: 103 mEq/L (ref 98–109)
EGFR: 51 mL/min/{1.73_m2} — ABNORMAL LOW (ref >90)
Glucose: 161 mg/dl — ABNORMAL HIGH (ref 70–140)
Potassium: 4.9 mEq/L (ref 3.5–5.1)
Sodium: 138 mEq/L (ref 136–145)
Total Protein: 6.6 g/dL (ref 6.4–8.3)

## 2015-10-06 LAB — CBC WITH DIFFERENTIAL/PLATELET
BASO%: 2.4 % — AB (ref 0.0–2.0)
BASOS ABS: 0.1 10*3/uL (ref 0.0–0.1)
EOS ABS: 0.1 10*3/uL (ref 0.0–0.5)
EOS%: 1.2 % (ref 0.0–7.0)
HEMATOCRIT: 41.3 % (ref 38.4–49.9)
HGB: 13.7 g/dL (ref 13.0–17.1)
LYMPH#: 0.6 10*3/uL — AB (ref 0.9–3.3)
LYMPH%: 10.7 % — AB (ref 14.0–49.0)
MCH: 31.1 pg (ref 27.2–33.4)
MCHC: 33.2 g/dL (ref 32.0–36.0)
MCV: 93.6 fL (ref 79.3–98.0)
MONO#: 0.6 10*3/uL (ref 0.1–0.9)
MONO%: 11.2 % (ref 0.0–14.0)
NEUT#: 3.8 10*3/uL (ref 1.5–6.5)
NEUT%: 74.5 % (ref 39.0–75.0)
PLATELETS: 239 10*3/uL (ref 140–400)
RBC: 4.41 10*6/uL (ref 4.20–5.82)
RDW: 13.2 % (ref 11.0–14.6)
WBC: 5.1 10*3/uL (ref 4.0–10.3)

## 2015-10-06 NOTE — Progress Notes (Signed)
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 He received definitive concurrent chemoradiation with weekly Carboplatin/Taxol on 06/14/2011; and daily XRT 06/21/11. Chemotherapy completed on 07/26/11 (last dose held due to fatigue, anorexia, and mucositis). Radiation completed on 08/04/11. The patient also has background history of breast cancer who was treated with surgery and tamoxifen. He denies any palpable chest wall mass. On 12/20/2013, he underwent resection of the right temporal region for melanoma. PET CT scan on 01/31/2014 showed lesion in the prostate but no evidence of other cancer.  INTERVAL HISTORY: Please see below for problem oriented charting. He is seen with his wife. He sits on a wheelchair and does not say much. He denies pain. His wife stated that the patient has fallen twice and is completely dependent on her for all activities of daily living. He is completely bedbound at home. He has intermittent confusion. Palliative care services into the house in May but they have not been back since. His wife is worried that she may not be able to take care of him at home further She reported no recent infection He has skin some weight  REVIEW OF SYSTEMS:   Constitutional: Denies fevers, chills or abnormal weight loss Eyes: Denies blurriness of vision Ears, nose, mouth, throat, and face: Denies mucositis or sore throat Respiratory: Denies cough, dyspnea or wheezes Cardiovascular: Denies palpitation, chest discomfort or lower extremity swelling Gastrointestinal:  Denies  nausea, heartburn or change in bowel habits Skin: Denies abnormal skin rashes Lymphatics: Denies new lymphadenopathy or easy bruising All other systems were reviewed with the patient's wife and are negative.  I have reviewed the past medical history, past surgical history, social history and family history with the patient and they are unchanged from previous note.  ALLERGIES:  is allergic to hydromorphone; morphine and related; and codeine.  MEDICATIONS:  Current Outpatient Prescriptions  Medication Sig Dispense Refill  . amLODipine (NORVASC) 2.5 MG tablet 2.5 mg by PEG Tube route daily.    . carbamazepine (TEGRETOL) 200 MG tablet Take 1 tablet (200 mg total) by mouth 3 (three) times daily. (Patient taking differently: Give 200 mg by tube 3 (three) times daily. ) 90 tablet 1  . ENSURE (ENSURE) 88 mLs by PEG Tube route 4 (four) times daily.     Marland Kitchen escitalopram (LEXAPRO) 10 MG tablet 5 mg by PEG Tube route daily.     Marland Kitchen escitalopram (LEXAPRO) 5 MG tablet Take 1 tablet (5 mg total) by mouth daily. 90 tablet 3  . HYDROcodone-acetaminophen (NORCO) 10-325 MG tablet Take 1 tablet by mouth every 8 (eight) hours as needed. (Patient taking differently: 1 tablet by PEG Tube route every 8 (eight) hours as needed for moderate pain. ) 30 tablet 0  . Nutritional Supplements (FEEDING SUPPLEMENT, JEVITY 1.5 CAL/FIBER,) LIQD 1.5 cans Jevity 1.5 QID with 130 ml free water before and after each bolus feeding as tolerated via feeding tube. 1422 mL   . Nutritional Supplements (ISOSOURCE 1.5 CAL) LIQD 1.5 Cans by PEG Tube route 4 (four) times daily. Pt gets 1 and a half can qid    . omeprazole (PRILOSEC) 20 MG capsule 20 mg by  PEG Tube route 2 (two) times daily before a meal.     . polyethylene glycol (MIRALAX / GLYCOLAX) packet Instill 1 pack daily via tube (Patient taking differently: Give 17 g by tube every other day. ) 30 each 1  . traMADol (ULTRAM) 50 MG tablet Take 1 tablet (50 mg total) by mouth every 8 (eight)  hours as needed. 90 tablet 0  . Travoprost, BAK Free, (TRAVATAMN) 0.004 % SOLN ophthalmic solution Place 1 drop into both eyes at bedtime.    Marland Kitchen UNABLE TO FIND Med Name:  Z-Quil liquid by peg tube at bedtime to help sleep.    . Glycerin, Adult, 2.1 G SUPP Place one suppository per rectum daily as needed until BM (Patient not taking: Reported on 08/28/2015) 25 suppository 1  . Lactulose SOLN 15 mLs by Does not apply route 2 (two) times daily. (Patient not taking: Reported on 08/28/2015) 1000 mL 1  . ondansetron (ZOFRAN-ODT) 8 MG disintegrating tablet Take 1 tablet (8 mg total) by mouth daily as needed for nausea or vomiting. (Patient not taking: Reported on 10/06/2015) 20 tablet 6   No current facility-administered medications for this visit.   Facility-Administered Medications Ordered in Other Visits  Medication Dose Route Frequency Provider Last Rate Last Dose  . topical emolient (BIAFINE) emulsion   Topical PRN Kyung Rudd, MD        PHYSICAL EXAMINATION: ECOG PERFORMANCE STATUS: 4 - Bedbound  Filed Vitals:   10/06/15 1246  BP: 159/83  Pulse: 61  Temp: 98.2 F (36.8 C)  Resp: 18   Filed Weights   10/06/15 1246  Weight: 188 lb 8 oz (85.503 kg)    GENERAL:alert, no distress and comfortable. He is sitting on the wheelchair with good eye contact SKIN: skin color, texture, turgor are normal, no rashes or significant lesions EYES: normal, Conjunctiva are pink and non-injected, sclera clear OROPHARYNX:no exudate, no erythema and lips, buccal mucosa, and tongue normal . He has no teeth NECK: supple, thyroid normal size, non-tender, without nodularity LYMPH:  no palpable lymphadenopathy in the cervical, axillary or inguinal LUNGS: clear to auscultation and percussion with normal breathing effort HEART: regular rate & rhythm and no murmurs and no lower extremity edema ABDOMEN:abdomen soft, non-tender and normal bowel sounds. Feeding tube site looks okay Musculoskeletal:no cyanosis of digits  and no clubbing . Noted mastectomy scar on the right chest with no signs of recurrence NEURO: alert but unable to assess further due to dementia  LABORATORY DATA:  I have reviewed the data as listed    Component Value Date/Time   NA 138 10/06/2015 1223   NA 139 08/28/2015 1041   K 4.9 10/06/2015 1223   K 4.9 08/28/2015 1041   CL 101 08/28/2015 1041   CL 106 06/06/2012 1041   CO2 26 10/06/2015 1223   CO2 29 08/28/2015 1029   GLUCOSE 161* 10/06/2015 1223   GLUCOSE 128* 08/28/2015 1041   GLUCOSE 148* 06/06/2012 1041   BUN 38.5* 10/06/2015 1223   BUN 37* 08/28/2015 1041   CREATININE 1.3 10/06/2015 1223   CREATININE 1.10 08/28/2015 1041   CALCIUM 8.8 10/06/2015 1223   CALCIUM 9.0 08/28/2015 1029   PROT 6.6 10/06/2015 1223   PROT 6.9 08/28/2015 1029   ALBUMIN 3.3* 10/06/2015 1223   ALBUMIN 3.9 08/28/2015 1029   AST 19 10/06/2015 1223   AST 25 08/28/2015 1029   ALT 25 10/06/2015 1223   ALT 25 08/28/2015 1029   ALKPHOS 92 10/06/2015 1223   ALKPHOS 82  08/28/2015 1029   BILITOT 0.31 10/06/2015 1223   BILITOT 0.3 08/28/2015 1029   GFRNONAA 52* 08/28/2015 1029   GFRAA >60 08/28/2015 1029    No results found for: SPEP, UPEP  Lab Results  Component Value Date   WBC 5.1 10/06/2015   NEUTROABS 3.8 10/06/2015   HGB 13.7 10/06/2015   HCT 41.3 10/06/2015   MCV 93.6 10/06/2015   PLT 239 10/06/2015      Chemistry      Component Value Date/Time   NA 138 10/06/2015 1223   NA 139 08/28/2015 1041   K 4.9 10/06/2015 1223   K 4.9 08/28/2015 1041   CL 101 08/28/2015 1041   CL 106 06/06/2012 1041   CO2 26 10/06/2015 1223   CO2 29 08/28/2015 1029   BUN 38.5* 10/06/2015 1223   BUN 37* 08/28/2015 1041   CREATININE 1.3 10/06/2015 1223   CREATININE 1.10 08/28/2015 1041      Component Value Date/Time   CALCIUM 8.8 10/06/2015 1223   CALCIUM 9.0 08/28/2015 1029   ALKPHOS 92 10/06/2015 1223   ALKPHOS 82 08/28/2015 1029   AST 19 10/06/2015 1223   AST 25 08/28/2015 1029   ALT 25  10/06/2015 1223   ALT 25 08/28/2015 1029   BILITOT 0.31 10/06/2015 1223   BILITOT 0.3 08/28/2015 1029      ASSESSMENT & PLAN:  History of tongue cancer  Clinically, he had no signs of cancer recurrence. We will discontinue surveillance history and physical examination.  History of malignant neoplasm of male breast Clinically, no evidence of disease    S/P gastrostomy Feeding tube site looks okay without any signs of infection. The patient is fully dependent on feeding tube for nutritional needs. He will continue indefinitely.   Dementia The patient has progressive dementia. He has recurrent falls and is currently bedbound His wife felt that it is very difficult to care for him at home. Previously, his radiation oncologist has consulted the palliative care service. I will get my nurse to call palliative care service to extended service to hospice.   DNR (do not resuscitate) The patient's wife is aware even though he has no signs of disease, he has progressive decline overall in performance status with advanced dementia and complete dependency on caregiver for all activities of daily living. He has recurrent falls We discussed CODE STATUS; the patient is DNR. We will contact homecare palliative service to extend hospice care to the patient   No orders of the defined types were placed in this encounter.   All questions were answered. The patient knows to call the clinic with any problems, questions or concerns. No barriers to learning was detected. I spent 25 minutes counseling the patient face to face. The total time spent in the appointment was 30 minutes and more than 50% was on counseling and review of test results     [facility], [place], MD 10/06/2015 2:24 PM

## 2015-10-06 NOTE — Assessment & Plan Note (Signed)
Clinically, no evidence of disease

## 2015-10-06 NOTE — Assessment & Plan Note (Signed)
The patient has progressive dementia. He has recurrent falls and is currently bedbound His wife felt that it is very difficult to care for him at home. Previously, his radiation oncologist has consulted the palliative care service. I will get my nurse to call palliative care service to extended service to hospice.

## 2015-10-06 NOTE — Assessment & Plan Note (Signed)
The patient's wife is aware even though he has no signs of disease, he has progressive decline overall in performance status with advanced dementia and complete dependency on caregiver for all activities of daily living. He has recurrent falls We discussed CODE STATUS; the patient is DNR. We will contact homecare palliative service to extend hospice care to the patient

## 2015-10-06 NOTE — Assessment & Plan Note (Signed)
Clinically, he had no signs of cancer recurrence. We will discontinue surveillance history and physical examination.

## 2015-10-06 NOTE — Assessment & Plan Note (Signed)
Feeding tube site looks okay without any signs of infection. The patient is fully dependent on feeding tube for nutritional needs. He will continue indefinitely.

## 2015-10-07 ENCOUNTER — Telehealth: Payer: Self-pay | Admitting: *Deleted

## 2015-10-07 LAB — PSA: PROSTATE SPECIFIC AG, SERUM: 16.1 ng/mL — AB (ref 0.0–4.0)

## 2015-10-07 NOTE — Telephone Encounter (Signed)
Left message for Jose [name] with [facility] to call us regarding Jose [name]. Pt has fallen X 2 in the last few days, is bed bound and progressive dementia. Need to change to Hospice care from [facility].

## 2015-10-09 ENCOUNTER — Telehealth: Payer: Self-pay | Admitting: *Deleted

## 2015-10-09 NOTE — Telephone Encounter (Signed)
Called [facility] and made Hospice Referral per Dr. Alvy Bimler.  S/w Yvette.  Pt is established w/ Palliative Care and his condition is declining requiring Hospice Care now.

## 2015-12-20 IMAGING — CR DG CHEST 2V
1 series · 1 of 1 positions shown · non-contrast
Comparison: 02/08/2013

CLINICAL DATA: 85-year-old male with chest pain, shortness of
breath congestion.

EXAM:
CHEST  2 VIEW

[w chest lat]
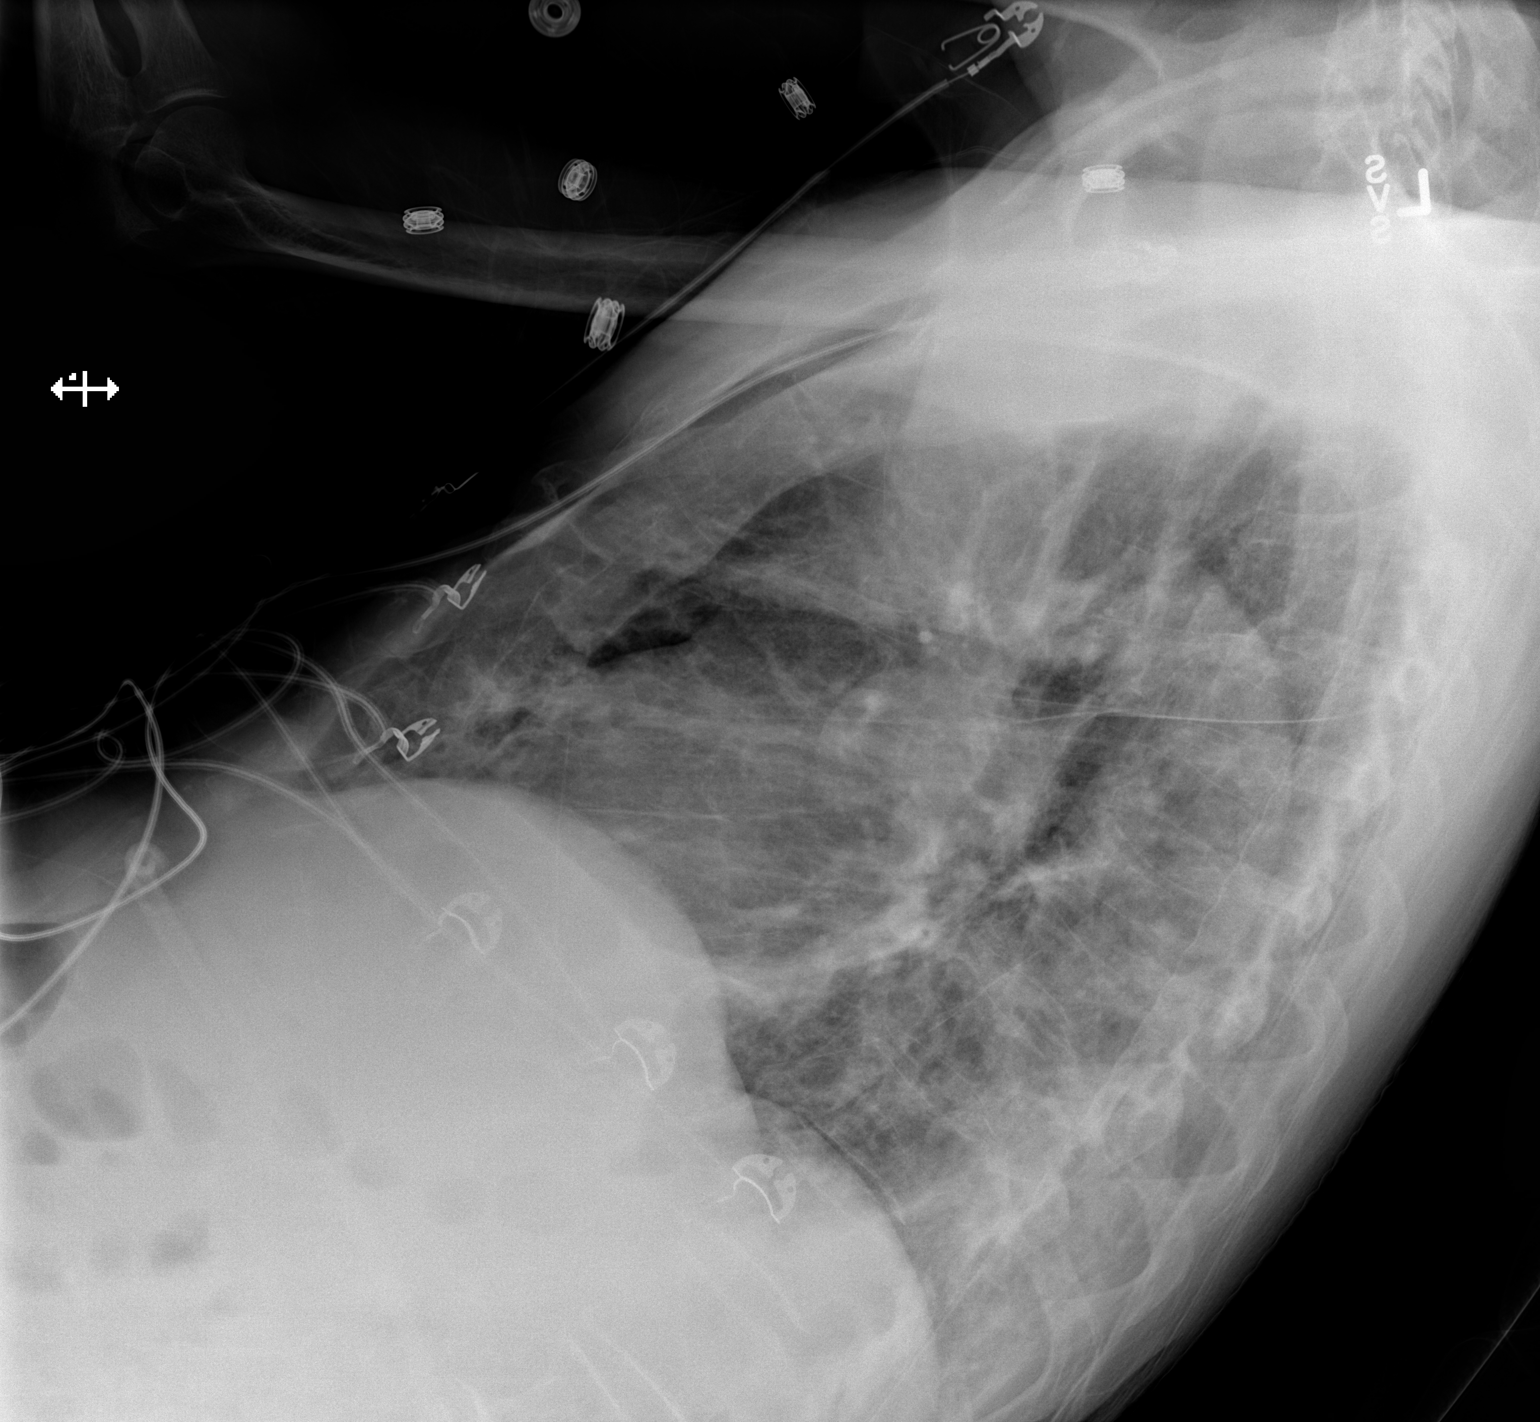

[1 of 1 positions shown; findings below may reference images not displayed]

FINDINGS: Upper limits normal heart size identified.

Pulmonary vascular congestion is identified.

Right lower lobe airspace opacity is suspicious for pneumonia.

There is no evidence of pleural effusion or pneumothorax.

Surgical clips within the right axilla are again noted..
IMPRESSION: Right lower lobe airspace disease/pneumonia -radiographic follow-up
to resolution is recommended.

Pulmonary vascular congestion

## 2016-01-21 ENCOUNTER — Telehealth: Payer: Self-pay | Admitting: *Deleted

## 2016-01-21 NOTE — Telephone Encounter (Signed)
Called patient home, last note in Epic showed 10/09/15 that was in hospice care,health declining, he has a follow up this Monday 01/26/16, was going ask wife if she wanted to [place], not sure if patient is still living, no answer phone rang 25x 9:29 AM

## 2016-01-26 ENCOUNTER — Ambulatory Visit: Admission: RE | Admit: 2016-01-26 | Payer: Medicare Other | Source: Ambulatory Visit | Admitting: Radiation Oncology

## 2016-01-27 ENCOUNTER — Other Ambulatory Visit: Payer: Self-pay | Admitting: Internal Medicine

## 2016-02-25 ENCOUNTER — Other Ambulatory Visit (HOSPITAL_COMMUNITY): Payer: Self-pay | Admitting: Interventional Radiology

## 2016-02-25 DIAGNOSIS — R633 Feeding difficulties, unspecified: Secondary | ICD-10-CM

## 2016-02-29 ENCOUNTER — Emergency Department (HOSPITAL_COMMUNITY): Payer: Medicare Other

## 2016-02-29 ENCOUNTER — Encounter (HOSPITAL_COMMUNITY): Payer: Self-pay | Admitting: Emergency Medicine

## 2016-02-29 ENCOUNTER — Emergency Department (HOSPITAL_COMMUNITY)
Admission: EM | Admit: 2016-02-29 | Discharge: 2016-03-01 | Disposition: A | Discharge status: Home / Self Care | Payer: Medicare Other | Attending: Emergency Medicine | Admitting: Emergency Medicine

## 2016-02-29 DIAGNOSIS — Z431 Encounter for attention to gastrostomy: Secondary | ICD-10-CM

## 2016-02-29 DIAGNOSIS — Z85818 Personal history of malignant neoplasm of other sites of lip, oral cavity, and pharynx: Secondary | ICD-10-CM | POA: Insufficient documentation

## 2016-02-29 DIAGNOSIS — R109 Unspecified abdominal pain: Secondary | ICD-10-CM | POA: Diagnosis not present

## 2016-02-29 DIAGNOSIS — T85528A Displacement of other gastrointestinal prosthetic devices, implants and grafts, initial encounter: Secondary | ICD-10-CM

## 2016-02-29 DIAGNOSIS — N189 Chronic kidney disease, unspecified: Secondary | ICD-10-CM | POA: Insufficient documentation

## 2016-02-29 DIAGNOSIS — Z853 Personal history of malignant neoplasm of breast: Secondary | ICD-10-CM | POA: Insufficient documentation

## 2016-02-29 DIAGNOSIS — Z87891 Personal history of nicotine dependence: Secondary | ICD-10-CM | POA: Insufficient documentation

## 2016-02-29 DIAGNOSIS — Z85828 Personal history of other malignant neoplasm of skin: Secondary | ICD-10-CM | POA: Diagnosis not present

## 2016-02-29 DIAGNOSIS — Z931 Gastrostomy status: Secondary | ICD-10-CM

## 2016-02-29 DIAGNOSIS — K9420 Gastrostomy complication, unspecified: Secondary | ICD-10-CM | POA: Diagnosis not present

## 2016-02-29 DIAGNOSIS — I129 Hypertensive chronic kidney disease with stage 1 through stage 4 chronic kidney disease, or unspecified chronic kidney disease: Secondary | ICD-10-CM | POA: Diagnosis not present

## 2016-02-29 DIAGNOSIS — Z79899 Other long term (current) drug therapy: Secondary | ICD-10-CM | POA: Diagnosis not present

## 2016-02-29 DIAGNOSIS — Z85810 Personal history of malignant neoplasm of tongue: Secondary | ICD-10-CM | POA: Insufficient documentation

## 2016-02-29 LAB — CBC WITH DIFFERENTIAL/PLATELET
BASOS ABS: 0 10*3/uL (ref 0.0–0.1)
Basophils Relative: 1 %
EOS PCT: 0 %
Eosinophils Absolute: 0 10*3/uL (ref 0.0–0.7)
HEMATOCRIT: 42.1 % (ref 39.0–52.0)
Hemoglobin: 14.4 g/dL (ref 13.0–17.0)
LYMPHS PCT: 11 %
Lymphs Abs: 0.8 10*3/uL (ref 0.7–4.0)
MCH: 31 pg (ref 26.0–34.0)
MCHC: 34.2 g/dL (ref 30.0–36.0)
MCV: 90.7 fL (ref 78.0–100.0)
MONO ABS: 0.9 10*3/uL (ref 0.1–1.0)
MONOS PCT: 12 %
Neutro Abs: 5.4 10*3/uL (ref 1.7–7.7)
Neutrophils Relative %: 76 %
PLATELETS: 275 10*3/uL (ref 150–400)
RBC: 4.64 MIL/uL (ref 4.22–5.81)
RDW: 12.6 % (ref 11.5–15.5)
WBC: 7.1 10*3/uL (ref 4.0–10.5)

## 2016-02-29 LAB — URINALYSIS, ROUTINE W REFLEX MICROSCOPIC
BACTERIA UA: NONE SEEN
Bilirubin Urine: NEGATIVE
Glucose, UA: NEGATIVE mg/dL
KETONES UR: NEGATIVE mg/dL
Leukocytes, UA: NEGATIVE
Nitrite: NEGATIVE
PROTEIN: 100 mg/dL — AB
SQUAMOUS EPITHELIAL / LPF: NONE SEEN
Specific Gravity, Urine: 1.031 — ABNORMAL HIGH (ref 1.005–1.030)
pH: 7 (ref 5.0–8.0)

## 2016-02-29 LAB — COMPREHENSIVE METABOLIC PANEL
ALBUMIN: 4.3 g/dL (ref 3.5–5.0)
ALK PHOS: 93 U/L (ref 38–126)
ALT: 26 U/L (ref 17–63)
AST: 27 U/L (ref 15–41)
Anion gap: 7 (ref 5–15)
BILIRUBIN TOTAL: 0.6 mg/dL (ref 0.3–1.2)
BUN: 40 mg/dL — AB (ref 6–20)
CO2: 28 mmol/L (ref 22–32)
Calcium: 9.3 mg/dL (ref 8.9–10.3)
Chloride: 101 mmol/L (ref 101–111)
Creatinine, Ser: 1.36 mg/dL — ABNORMAL HIGH (ref 0.61–1.24)
GFR calc Af Amer: 53 mL/min — ABNORMAL LOW (ref >60)
GFR calc non Af Amer: 45 mL/min — ABNORMAL LOW (ref >60)
GLUCOSE: 136 mg/dL — AB (ref 65–99)
POTASSIUM: 5 mmol/L (ref 3.5–5.1)
SODIUM: 136 mmol/L (ref 135–145)
TOTAL PROTEIN: 7.3 g/dL (ref 6.5–8.1)

## 2016-02-29 LAB — I-STAT CREATININE, ED: Creatinine, Ser: 1.3 mg/dL — ABNORMAL HIGH (ref 0.61–1.24)

## 2016-02-29 LAB — LIPASE, BLOOD: Lipase: 36 U/L (ref 11–51)

## 2016-02-29 MED ORDER — LORAZEPAM 2 MG/ML IJ SOLN
0.5000 mg | Freq: Once | INTRAMUSCULAR | Status: AC
  Administered 2016-02-29: 0.5 mg via INTRAVENOUS
  Filled 2016-02-29: qty 1

## 2016-02-29 MED ORDER — IOPAMIDOL (ISOVUE-300) INJECTION 61%
100.0000 mL | Freq: Once | INTRAVENOUS | Status: AC | PRN
  Administered 2016-02-29: 80 mL via INTRAVENOUS

## 2016-02-29 MED ORDER — IOPAMIDOL (ISOVUE-300) INJECTION 61%
INTRAVENOUS | Status: AC
  Administered 2016-02-29: 30 mL via GASTROSTOMY
  Filled 2016-02-29: qty 30

## 2016-02-29 NOTE — ED Provider Notes (Signed)
[facility] DEPT Provider Note   CSN: [id number] Arrival date & time: 02/29/16  1843  LEVEL 5 CAVEAT - DEMENTIA   History   Chief Complaint Chief Complaint  Patient presents with  . Pulled G-tube out    HPI Jose [name] is a 82 y.o. male.  HPI  82 year old male presents after pulling his G-tube out. History is taken from the wife as the patient has significant dementia. She was getting ready to give him medicine and he became agitated and pulled the G-tube out. The balloon seemed to be intact. Patient has had a G-tube for the past several years. He has a history of throat cancer and is unable to take food or drink or medicine orally. Family also notes that the patient has had abdominal distention and complaining of abdominal pain for about 4 weeks. He has also had brown drainage out of the G-tube and around the G-tube for a couple weeks. He has mild erythema around his G-tube site that they state is chronic. They are not sure what size is G-tube was.  Past Medical History:  Diagnosis Date  . Anemia   . Anxiety   . 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 Arthritis    knees,back,shoulders  . Blood transfusion    2006 had 4 units  . 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 Cataract immature   . Chronic kidney disease    kidney stones  . Colon polyps   . Diverticulosis   . Diverticulosis of colon   . Elevated PSA 02/06/2014  . Frequency of urination   . G tube feedings ([place])   . GERD (gastroesophageal reflux disease)   . Glaucoma   . Headache(784.0)    migraines  . Hernia    inguinal  . History of acute renal failure 01-23-11   DUE TO HYDRONEPHROSIS AND RIGHT STONE  . History of chemotherapy 07/26/11   taxol/carbo completed/stopped 07/26/11  . History of GI diverticular bleed   . History of radiation therapy 06/21/11-08/04/11   tongue/h/n 69.96 gy in 33 fxs  . History of radiation therapy 06/21/11-08/04/11   base of tongue  . Hyperlipemia   . Hypertension     . Impaired hearing NOT WEARING HIS BILATERL AIDS  . Insomnia   . Male breast cancer ([place]) 1988   S/P RIGHT MASTECTOMY W/ NODE DISSECTION AND CHEMO  . Melanoma of skin ([place]) 12/27/2013  . Nausea without vomiting 12/27/2013  . Normal cardiac stress test 09-25-2007  . Prostate cancer screening 02/05/2014  . Right ureteral stone S/P URETERAL STENT 01-24-11  AND ESWL  03-01-11  . Skin cancer 2002   melanoma- back  . Trigeminal neuralgia   . Urgency of urination    DUE TO URETERAL STONE AND STENT    Patient Active Problem List   Diagnosis Date Noted  . Dementia 10/06/2015  . DNR (do not resuscitate) 10/06/2015  . Elevated PSA 02/06/2014  . Prostate cancer screening 02/05/2014  . Prostate mass 02/05/2014  . Nausea without vomiting 12/27/2013  . History of melanoma 12/27/2013  . S/P gastrostomy ([place]) 12/27/2013  . Other dysphagia 05/22/2013  . Glaucoma 05/22/2013  . HCAP (healthcare-associated pneumonia) 05/17/2013  . SOB (shortness of breath) 05/17/2013  . FTT (failure to thrive) in adult 05/17/2013  . Can't get food down 05/06/2013  . Esophageal stenosis 05/06/2013  . Gastrostomy in place [facility]) 05/06/2013  . Cancer of oropharynx ([place]) 05/06/2013  . Free intraperitoneal air  05/06/2013  . Pneumothorax 05/04/2013  . 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 History of malignant neoplasm of male breast   . History of tongue cancer 04/21/2011  . Nephrolithiasis 01/24/2011  . BPH (benign prostatic hyperplasia) 01/24/2011  . ARF (acute renal failure) ([place]) 01/24/2011  . Anxiety state, unspecified 11/19/2009  . TRIGEMINAL NEURALGIA 10/12/2007  . HYPERCHOLESTEROLEMIA 11/08/2006  . Dyslipidemia 11/08/2006  . DIVERTICULOSIS, COLON 11/08/2006  . RUQ PAIN 11/08/2006  . COLONIC POLYPS, HX OF 11/08/2006  . Essential hypertension 10/13/2006    Past Surgical History:  Procedure Laterality Date  . CRANIECTOMY SUBOCCIPITAL FOR EXPLORATION / DECOMPRESSION CRANIAL NERVES  2003   5TH CRANIAL  NERVE DECOMPRESSION  . CYSTOSCOPY W/ URETERAL STENT PLACEMENT  01/24/2011   Procedure: CYSTOSCOPY WITH RETROGRADE PYELOGRAM/URETERAL STENT PLACEMENT;  Surgeon: Fredricka Bonine, MD;  Location: [facility];  Service: Urology;  Laterality: Right;  . diagnostic laproscopy  05/03/13   Dr. Margarette Canada at [place]  02/14/2013   Procedure: DIRECT LARYNGOSCOPY;  Surgeon: Izora Gala, MD;  Location: [facility];  Service: ENT;;  . ESOPHAGEAL DILATION  05/01/13   tread method esophageal dilation - Dr. Fredricka Bonine  . ESOPHAGOSCOPY WITH DILITATION N/A 02/14/2013   Procedure: ESOPHAGOSCOPY WITH DILITATION;  Surgeon: Izora Gala, MD;  Location: [facility];  Service: ENT;  Laterality: N/A;  Attempted  . EXAMINATION UNDER ANESTHESIA  03/26/2011   Procedure: EXAM UNDER ANESTHESIA;  Surgeon: Fredricka Bonine, MD;  Location: [facility];  Service: Urology;  Laterality: N/A;  . EXTRACORPOREAL SHOCK WAVE LITHOTRIPSY  03-01-11   RIGHT  . GASTROSTOMY TUBE PLACEMENT    . LEFT LEG SURG FOR FX  AGE 77   patient states left thigh  . LYMPH NODE BIOPSY  04/21/2011   Procedure: LYMPH NODE BIOPSY;  Surgeon: Beckie Salts, MD;  Location: [facility];  Service: ENT;  Laterality: Right;  . MELANOMA EXCISION  10 YRS AGO   BACK AREA  . SIMPLE MASTECTOMY  1988   MALE--- RIGHT BREAST W/ NODE DISSECTION  . URETEROSCOPY  03/26/2011   Procedure: URETEROSCOPY;  Surgeon: Fredricka Bonine, MD;  Location: [facility];  Service: Urology;  Laterality: Right;  RIGHT URETEROSCOPY, HOLMIUM LASER AND STENT        Home Medications    Prior to Admission medications   Medication Sig Start Date End Date Taking? Authorizing Provider  amLODipine (NORVASC) 2.5 MG tablet Place 2.5 mg into feeding tube daily.    Yes Historical Provider, MD  carbamazepine (TEGRETOL) 200 MG tablet Place 200 mg into feeding tube 3 (three) times daily.   Yes Historical Provider, MD    DiphenhydrAMINE HCl (ZZZQUIL) 50 MG/30ML LIQD Place 30 mLs into feeding tube at bedtime as needed (for sleep).   Yes Historical Provider, MD  escitalopram (LEXAPRO) 5 MG tablet Place 5 mg into feeding tube daily.   Yes Historical Provider, MD  HYDROcodone-acetaminophen (NORCO) 10-325 MG tablet Place 1 tablet into feeding tube every 8 (eight) hours as needed for severe pain.   Yes Historical Provider, MD  Nutritional Supplements (ISOSOURCE 1.5 CAL) LIQD Place 1 Can into feeding tube 5 (five) times daily.   Yes Historical Provider, MD  omeprazole (PRILOSEC) 20 MG capsule 20 mg by PEG Tube route 2 (two) times daily before a meal.    Yes Historical Provider, MD  ondansetron (ZOFRAN-ODT) 8 MG disintegrating tablet Place 8 mg into feeding tube every 8 (eight) hours as  needed for nausea or vomiting.   Yes Historical Provider, MD  polyethylene glycol (MIRALAX / GLYCOLAX) packet Place 17 g into feeding tube daily as needed for mild constipation.   Yes Historical Provider, MD  traMADol (ULTRAM) 50 MG tablet Place 50 mg into feeding tube every 8 (eight) hours as needed for moderate pain.   Yes Historical Provider, MD  Travoprost, BAK Free, (TRAVATAMN) 0.004 % SOLN ophthalmic solution Place 1 drop into both eyes at bedtime. 01/27/11  Yes Monika Salk, MD  Water For Irrigation, Sterile (STERILE WATER FOR IRRIGATION) Irrigate with 150 mLs as directed 6 (six) times daily.   Yes Historical Provider, MD    Family History Family History  Problem Relation Age of Onset  . Heart disease Mother   . Heart disease Father   . Stomach cancer Paternal Grandmother   . Cancer Sister     "perineum"    Social History Social History  Substance Use Topics  . Smoking status: Former Smoker    Packs/day: 0.25    Years: 30.00    Types: Cigarettes, Cigars    Quit date: 12/20/1980  . Smokeless tobacco: Never Used  . Alcohol use No     Allergies   Hydromorphone; Morphine and related; and Codeine   Review of  Systems Review of Systems  Unable to perform ROS: Dementia     Physical Exam Updated Vital Signs BP (!) 234/130   Pulse 102   Temp 98 F (36.7 C) (Oral)   Resp 18   Ht 6' (1.829 m)   Wt 188 lb (85.3 kg)   SpO2 95%   BMI 25.50 kg/m   Physical Exam  Constitutional: He appears well-developed and well-nourished.  HENT:  Head: Normocephalic and atraumatic.  Right Ear: External ear normal.  Left Ear: External ear normal.  Nose: Nose normal.  Eyes: Right eye exhibits no discharge. Left eye exhibits no discharge.  Neck: Neck supple.  Cardiovascular: Normal rate, regular rhythm and normal heart sounds.   Pulmonary/Chest: Effort normal and breath sounds normal.  Abdominal: Soft. He exhibits distension (lower abdomen). There is tenderness.  LUQ G-tube site with mild erythema. Large amount of brown liquid quickly comes out of site when bandage is removed.  Musculoskeletal: He exhibits no edema.  Neurological: He is alert. He is disoriented.  Skin: Skin is warm and dry. He is not diaphoretic.  Nursing note and vitals reviewed.    ED Treatments / Results  Labs (all labs ordered are listed, but only abnormal results are displayed) Labs Reviewed  COMPREHENSIVE METABOLIC PANEL - Abnormal; Notable for the following:       Result Value   Glucose, Bld 136 (*)    BUN 40 (*)    Creatinine, Ser 1.36 (*)    GFR calc non Af Amer 45 (*)    GFR calc Af Amer 53 (*)    All other components within normal limits  URINALYSIS, ROUTINE W REFLEX MICROSCOPIC - Abnormal; Notable for the following:    APPearance HAZY (*)    Specific Gravity, Urine 1.031 (*)    Hgb urine dipstick SMALL (*)    Protein, ur 100 (*)    All other components within normal limits  I-STAT CREATININE, ED - Abnormal; Notable for the following:    Creatinine, Ser 1.30 (*)    All other components within normal limits  CBC WITH DIFFERENTIAL/PLATELET  LIPASE, BLOOD    EKG  EKG Interpretation None        Radiology  Ct Abdomen Pelvis W Contrast  Result Date: 02/29/2016 CLINICAL DATA:  Abdominal distention EXAM: CT ABDOMEN AND PELVIS WITH CONTRAST TECHNIQUE: Multidetector CT imaging of the abdomen and pelvis was performed using the standard protocol following bolus administration of intravenous contrast. CONTRAST:  57mL ISOVUE-300 IOPAMIDOL (ISOVUE-300) INJECTION 61% COMPARISON:  Same day radiographs of the abdomen, 02/02/2013 CT and 01/09/2015 radiographs of the abdomen FINDINGS: Lower c borderline cardiomegaly without pneumonic consolidation or effusion. Bibasilar dependent atelectasis. No pericardial effusion. Hepatobiliary: The liver enhances homogeneously. No space-occupying mass or biliary dilatation. Gallbladder is distended without wall thickening. There is a nonobstructing 8 mm calculus along the dependent wall. Pancreas: Atrophic without focal mass or ductal dilatation. Spleen: No splenomegaly or focal mass. Adrenals/Urinary Tract: Both adrenal glands are unremarkable. There is 2 nonobstructing calculi in the interpolar right kidney measuring on the order of 2-3 mm. No enhancing mass lesions. There is slight cortical thinning of both kidneys. Small extrarenal pelves are noted. There is no hydroureteronephrosis. The urinary bladder is partially distended and slightly thick-walled as a result. No calculus or focal mass. Stomach/Bowel: The tip of the a percutaneous gastrostomy tube is seen within the body of the stomach. Enteric contrast is noted extending into small bowel without extravasation. No bowel obstruction. Moderate colonic stool burden. There is sigmoid diverticulosis without acute diverticulitis. Normal-appearing appendix. Vascular/Lymphatic: Aortoiliac atherosclerosis.  No lymphadenopathy. Reproductive: Prostate is enlarged with mild focal posterior eccentric bulge on the right. The overall dimension of the prostate is approximately 6.1 cm transverse by 5.2 cm AP. Bilateral fat containing  inguinal canals. Other: No ventral wall hernias, ascites or free air. Degenerative disc disease at L4-5 and L5-S1. Musculoskeletal: Lower lumbar facet arthropathy. No acute appearing osseous abnormality. IMPRESSION: Gastric tube in the stomach without extravasation of contrast. Cholelithiasis without complication. Right-sided nephrolithiasis. Colonic diverticulosis without acute diverticulitis. Mild prostate enlargement. Electronically Signed   By: Ashley Royalty M.D.   On: 02/29/2016 21:49   Dg Abdomen Peg Tube Location  Result Date: 02/29/2016 CLINICAL DATA:  Status post replacement of PEG tube. EXAM: ABDOMEN - 1 VIEW COMPARISON:  Prior radiograph from earlier the same day. FINDINGS: Percutaneous G-tube in place. Contrast material has been injected via the C2, and is seen filling portions of the stomach and duodenum. No extraluminal contrast identified to suggest leak. Bowel gas pattern within normal limits.  No other acute abnormality. Minimal atelectatic changes and at the left lung base. Scoliosis with multilevel degenerative changes noted within the visualized spine. IMPRESSION: Percutaneous G-tube in place. No extravasation of contrast material to suggest leak identified. The Electronically Signed   By: Jeannine Boga M.D.   On: 02/29/2016 21:38    Procedures Gastrostomy tube replacement Date/Time: 02/29/2016 7:58 PM Performed by: Sherwood Gambler Authorized by: Sherwood Gambler  Consent: Verbal consent obtained. Consent given by: spouse Required items: required blood products, implants, devices, and special equipment available Time out: Immediately prior to procedure a "time out" was called to verify the correct patient, procedure, equipment, support staff and site/side marked as required. Preparation: Patient was prepped and draped in the usual sterile fashion. Local anesthesia used: no  Anesthesia: Local anesthesia used: no  Sedation: Patient sedated: no Patient tolerance: Patient  tolerated the procedure well with no immediate complications Comments: 20 French foley catheter placed without difficulty    (including critical care time)  Medications Ordered in ED Medications  iopamidol (ISOVUE-300) 61 % injection 100 mL (80 mLs Intravenous Contrast Given 02/29/16 2101)  iopamidol (ISOVUE-300) 61 % injection (30  mLs Gastric Tube Contrast Given 02/29/16 2056)  LORazepam (ATIVAN) injection 0.5 mg (0.5 mg Intravenous Given 02/29/16 2217)     Initial Impression / Assessment and Plan / ED Course  I have reviewed the triage vital signs and the nursing notes.  Pertinent labs & imaging results that were available during my care of the patient were reviewed by me and considered in my medical decision making (see chart for details).  Clinical Course as of Mar 01 1032  Nancy Fetter Feb 29, 2016  [id number] Foley placed to keep tract open. Will get CT given distention and brown material coming out (feces?)  [SG]    Clinical Course User Index [SG] Sherwood Gambler, MD    CT without acute pathology. Foley remains in place. Agitation has been progressive over months, appears to be from dementia. Family already has close GI f/u this week, instructed to keep this. Unclear why he is having brown material out and around tube but there is no obvious fistula and the material does not obviously smell like feces. Given chronicity, I think it's ok to work up as outpatient. Strict return precautions.  Final Clinical Impressions(s) / ED Diagnoses   Final diagnoses:  Dislodged gastrostomy tube [facility])    New Prescriptions Discharge Medication List as of 03/01/2016 12:06 AM       Sherwood Gambler, MD 03/01/16 1035

## 2016-02-29 NOTE — ED Triage Notes (Signed)
Per EMS, pt is from home.  Pt has a hx of Alzheimer's and sundowners.  Wife of pt was attempting to feed pt through his g-tube when he became agitated and ripped it out.  Per EMS, site was not bleeding but was "oozing feeding material" and they put a bandage over it.  EMS was unable to obtain vitals d/t patient being uncooperative.

## 2016-02-29 NOTE — ED Notes (Signed)
Bed: [facility] Expected date:  Expected time:  Means of arrival:  Comments: EMS G tube out

## 2016-02-29 NOTE — ED Notes (Signed)
Patient and family know we need urine but patient says he is unable to go

## 2016-03-01 NOTE — ED Notes (Signed)
Pt BP is 234/130, Pior to DC made MD aware

## 2016-03-01 NOTE — ED Notes (Signed)
Bed: [facility] Expected date:  Expected time:  Means of arrival:  Comments: 

## 2016-03-04 ENCOUNTER — Ambulatory Visit (HOSPITAL_COMMUNITY)
Admission: RE | Admit: 2016-03-04 | Discharge: 2016-03-04 | Disposition: A | Discharge status: Home / Self Care | Payer: Medicare Other | Source: Ambulatory Visit | Attending: Interventional Radiology | Admitting: Interventional Radiology

## 2016-03-04 ENCOUNTER — Encounter (HOSPITAL_COMMUNITY): Payer: Self-pay | Admitting: Radiology

## 2016-03-04 ENCOUNTER — Other Ambulatory Visit (HOSPITAL_COMMUNITY): Payer: Self-pay | Admitting: Interventional Radiology

## 2016-03-04 DIAGNOSIS — Y733 Surgical instruments, materials and gastroenterology and urology devices (including sutures) associated with adverse incidents: Secondary | ICD-10-CM | POA: Insufficient documentation

## 2016-03-04 DIAGNOSIS — Z85810 Personal history of malignant neoplasm of tongue: Secondary | ICD-10-CM | POA: Diagnosis not present

## 2016-03-04 DIAGNOSIS — R633 Feeding difficulties, unspecified: Secondary | ICD-10-CM

## 2016-03-04 DIAGNOSIS — K9423 Gastrostomy malfunction: Secondary | ICD-10-CM | POA: Insufficient documentation

## 2016-03-04 HISTORY — PX: IR GENERIC HISTORICAL: IMG1180011

## 2016-03-04 NOTE — Procedures (Signed)
Existing 20 french gastrostomy tube (foley cath) exchanged out for new 20 french balloon retention gastrostomy without immediate complications. Tube secured to abd wall with binder as well. Mild erythema at insertion site- recommend application of vaseline to area and 1 layer of drain sponge gauze under outer disc.

## 2016-03-13 ENCOUNTER — Other Ambulatory Visit: Payer: Self-pay | Admitting: Nurse Practitioner

## 2016-03-29 ENCOUNTER — Other Ambulatory Visit: Payer: Self-pay | Admitting: Internal Medicine

## 2016-04-22 DEATH — deceased
